# Patient Record
Sex: Male | Born: 1935 | Race: White | Hispanic: No | Marital: Married | Smoking: Former smoker
Health system: Southern US, Community
[De-identification: ages and names within clinical notes are randomized; demographics above are authoritative.]

## PROBLEM LIST (undated history)

## (undated) DIAGNOSIS — I255 Ischemic cardiomyopathy: Secondary | ICD-10-CM

## (undated) DIAGNOSIS — Z95810 Presence of automatic (implantable) cardiac defibrillator: Secondary | ICD-10-CM

## (undated) DIAGNOSIS — M1990 Unspecified osteoarthritis, unspecified site: Secondary | ICD-10-CM

## (undated) DIAGNOSIS — F0390 Unspecified dementia without behavioral disturbance: Secondary | ICD-10-CM

## (undated) DIAGNOSIS — I219 Acute myocardial infarction, unspecified: Secondary | ICD-10-CM

## (undated) DIAGNOSIS — E039 Hypothyroidism, unspecified: Secondary | ICD-10-CM

## (undated) DIAGNOSIS — H8109 Meniere's disease, unspecified ear: Secondary | ICD-10-CM

## (undated) DIAGNOSIS — G250 Essential tremor: Secondary | ICD-10-CM

## (undated) DIAGNOSIS — Z950 Presence of cardiac pacemaker: Secondary | ICD-10-CM

## (undated) DIAGNOSIS — I2510 Atherosclerotic heart disease of native coronary artery without angina pectoris: Secondary | ICD-10-CM

## (undated) DIAGNOSIS — D649 Anemia, unspecified: Secondary | ICD-10-CM

## (undated) DIAGNOSIS — J449 Chronic obstructive pulmonary disease, unspecified: Secondary | ICD-10-CM

## (undated) DIAGNOSIS — K219 Gastro-esophageal reflux disease without esophagitis: Secondary | ICD-10-CM

## (undated) DIAGNOSIS — H9190 Unspecified hearing loss, unspecified ear: Secondary | ICD-10-CM

## (undated) DIAGNOSIS — K5732 Diverticulitis of large intestine without perforation or abscess without bleeding: Secondary | ICD-10-CM

## (undated) DIAGNOSIS — I509 Heart failure, unspecified: Secondary | ICD-10-CM

## (undated) DIAGNOSIS — Z789 Other specified health status: Secondary | ICD-10-CM

## (undated) DIAGNOSIS — K635 Polyp of colon: Secondary | ICD-10-CM

## (undated) DIAGNOSIS — N189 Chronic kidney disease, unspecified: Secondary | ICD-10-CM

## (undated) DIAGNOSIS — E785 Hyperlipidemia, unspecified: Secondary | ICD-10-CM

## (undated) DIAGNOSIS — I10 Essential (primary) hypertension: Secondary | ICD-10-CM

## (undated) DIAGNOSIS — Z9289 Personal history of other medical treatment: Secondary | ICD-10-CM

## (undated) HISTORY — DX: Gastro-esophageal reflux disease without esophagitis: K21.9

## (undated) HISTORY — DX: Chronic obstructive pulmonary disease, unspecified: J44.9

## (undated) HISTORY — DX: Diverticulitis of large intestine without perforation or abscess without bleeding: K57.32

## (undated) HISTORY — DX: Polyp of colon: K63.5

## (undated) HISTORY — DX: Meniere's disease, unspecified ear: H81.09

## (undated) HISTORY — DX: Ischemic cardiomyopathy: I25.5

## (undated) HISTORY — PX: EYE SURGERY: SHX253

## (undated) HISTORY — DX: Essential (primary) hypertension: I10

## (undated) HISTORY — DX: Hyperlipidemia, unspecified: E78.5

## (undated) HISTORY — DX: Essential tremor: G25.0

## (undated) HISTORY — DX: Atherosclerotic heart disease of native coronary artery without angina pectoris: I25.10

## (undated) HISTORY — DX: Unspecified osteoarthritis, unspecified site: M19.90

## (undated) HISTORY — DX: Acute myocardial infarction, unspecified: I21.9

## (undated) HISTORY — PX: CARDIAC DEFIBRILLATOR PLACEMENT: SHX171

---

## 1995-09-01 HISTORY — PX: CORONARY ARTERY BYPASS GRAFT: SHX141

## 2002-08-31 HISTORY — PX: JOINT REPLACEMENT: SHX530

## 2003-09-01 HISTORY — PX: CARDIAC CATHETERIZATION: SHX172

## 2009-08-01 ENCOUNTER — Encounter: Payer: Self-pay | Admitting: Pulmonary Disease

## 2009-09-02 ENCOUNTER — Ambulatory Visit: Payer: Self-pay | Admitting: Pulmonary Disease

## 2009-09-02 DIAGNOSIS — K219 Gastro-esophageal reflux disease without esophagitis: Secondary | ICD-10-CM | POA: Insufficient documentation

## 2009-09-02 DIAGNOSIS — E119 Type 2 diabetes mellitus without complications: Secondary | ICD-10-CM

## 2009-09-02 DIAGNOSIS — E785 Hyperlipidemia, unspecified: Secondary | ICD-10-CM | POA: Insufficient documentation

## 2009-09-02 DIAGNOSIS — J309 Allergic rhinitis, unspecified: Secondary | ICD-10-CM | POA: Insufficient documentation

## 2009-09-02 DIAGNOSIS — I2510 Atherosclerotic heart disease of native coronary artery without angina pectoris: Secondary | ICD-10-CM | POA: Insufficient documentation

## 2009-09-02 DIAGNOSIS — I10 Essential (primary) hypertension: Secondary | ICD-10-CM

## 2009-09-02 DIAGNOSIS — R042 Hemoptysis: Secondary | ICD-10-CM | POA: Insufficient documentation

## 2009-09-02 LAB — CONVERTED CEMR LAB
BUN: 24 mg/dL — ABNORMAL HIGH (ref 6–23)
Calcium: 9.4 mg/dL (ref 8.4–10.5)
Chloride: 111 meq/L (ref 96–112)
Creatinine, Ser: 1.5 mg/dL (ref 0.4–1.5)
Potassium: 4.8 meq/L (ref 3.5–5.1)
Sodium: 141 meq/L (ref 135–145)

## 2009-09-04 ENCOUNTER — Encounter: Payer: Self-pay | Admitting: Pulmonary Disease

## 2009-09-04 ENCOUNTER — Ambulatory Visit: Payer: Self-pay | Admitting: Cardiovascular Disease

## 2010-05-08 ENCOUNTER — Encounter: Payer: Self-pay | Admitting: Pulmonary Disease

## 2010-05-08 ENCOUNTER — Ambulatory Visit: Payer: Self-pay | Admitting: Pulmonary Disease

## 2010-05-08 DIAGNOSIS — J4489 Other specified chronic obstructive pulmonary disease: Secondary | ICD-10-CM | POA: Insufficient documentation

## 2010-05-08 DIAGNOSIS — J449 Chronic obstructive pulmonary disease, unspecified: Secondary | ICD-10-CM

## 2010-05-30 ENCOUNTER — Telehealth: Payer: Self-pay | Admitting: Pulmonary Disease

## 2010-09-24 ENCOUNTER — Encounter: Payer: Self-pay | Admitting: Pulmonary Disease

## 2010-09-24 DIAGNOSIS — J984 Other disorders of lung: Secondary | ICD-10-CM | POA: Insufficient documentation

## 2010-09-29 ENCOUNTER — Ambulatory Visit: Payer: Self-pay | Admitting: Cardiology

## 2010-09-30 NOTE — Assessment & Plan Note (Signed)
Summary: acute sick visit for possible hemoptysis   Copy to:  Lanier Ensign Primary Bradford Cazier/Referring Kade Rickels:  Lanier Ensign  CC:  Pt states he has been spitting up blood since last visit but it usually happens in the morning, Pt states occasional raspy voice, headaches, and Productive cough.  History of Present Illness: the pt comes in today for an acute sick visit.  He was last seen in Jan of this year where he was having mild hemoptysis.  It was felt this was coming primarily from the upper airway, and I asked him to work on nasal hygiene.  He comes in today where he feels that he has continued to bring up mucus with a pink tint, but he shows me a specimen in a tissue that shows yellow mucus with a slight tint in the center that is brown not red or pink.  He has not had any chest pain or recent infection.  He coughs up mucus on a regular basis, and does have some doe.  He and his wife ask if there is anything we an do about it.  Preventive Screening-Counseling & Management  Alcohol-Tobacco     Smoking Status: quit     Packs/Day: 0.5     Year Started: 1947     Year Quit: 1997  Current Medications (verified): 1)  Lansoprazole 30 Mg Cpdr (Lansoprazole) .... Take 1 Tablet By Mouth Once A Day As Needed 2)  Celebrex 200 Mg Caps (Celecoxib) .... Take 1 Tablet By Mouth Once A Day As Needed 3)  Simvastatin 40 Mg Tabs (Simvastatin) .... Take 1 Tablet By Mouth Once A Day 4)  Glimepiride 4 Mg Tabs (Glimepiride) .... Take 1 Tablet By Mouth Two Times A Day 5)  Claritin 10 Mg Tabs (Loratadine) .... Take 1 Tablet By Mouth Once A Day 6)  Ascriptin 325 Mg Tabs (Aspirin Buf(Alhyd-Mghyd-Cacar)) .... One Tab Once Daily 7)  Metoprolol Succinate 100 Mg Xr24h-Tab (Metoprolol Succinate) .... Take 1 Tablet By Mouth Once A Day 8)  Flovent Diskus 100 Mcg/blist Aepb (Fluticasone Propionate (Inhal)) .... Inhale 1 Puff Two Times A Day 9)  Meclizine Hcl 25 Mg Tabs (Meclizine Hcl) .... Take 1 Tablet By Mouth Three  Times A Day 10)  Losartan Potassium 100 Mg Tabs (Losartan Potassium) .... Take 1 Tablet By Mouth Once A Day 11)  Janumet 50-500 Mg Tabs (Sitagliptin-Metformin Hcl) .... One Tablet  By Mouth Two Times A Day 12)  Fish Oil 1000 Mg Caps (Omega-3 Fatty Acids) .... Take 2 Tabs Two Times A Day 13)  Qc Arthritis Pain Relief 650 Mg Cr-Tabs (Acetaminophen) .... Take By Mouth As Needed  Allergies: 1)  ! * Altace  Past History:  Past medical, surgical, family and social histories (including risk factors) reviewed, and no changes noted (except as noted below).  Past Medical History: chronic renal insufficiency GERD (ICD-530.81) CORONARY ARTERY DISEASE (ICD-414.00) ALLERGIC RHINITIS (ICD-477.9) HYPERTENSION (ICD-401.9) MYOCARDIAL INFARCTION (ICD-410.90) HYPERLIPIDEMIA (ICD-272.4) DM (ICD-250.00)      Dr. Eliott Nine is [facility]  Past Surgical History: Reviewed history from 09/02/2009 and no changes required. CABG Feb 1997 Gallbladder Aug 2005 R hip replacement Jan 2004  Family History: Reviewed history from 09/02/2009 and no changes required. cancer: sister (cervical), mother (stomach   Social History: Reviewed history from 09/02/2009 and no changes required. Patient states former smoker.  started at age 12 approx.  smoked 1 to 2 ppd.  quit 1997. pt is married. pt has children. pt is retired.  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  Packs/Day:  0.5  Review of Systems       The patient complains of productive cough, coughing up blood, acid heartburn, indigestion, headaches, nasal congestion/difficulty breathing through nose, sneezing, anxiety, depression, and change in color of mucus.  The patient denies shortness of breath with activity, shortness of breath at rest, non-productive cough, chest pain, irregular heartbeats, loss of appetite, weight change, abdominal pain, difficulty swallowing, sore throat, tooth/dental problems, itching, ear ache, hand/feet  swelling, joint stiffness or pain, rash, and fever.    Vital Signs:  Patient profile:   77 year old male Height:      69 inches Weight:      198.25 pounds O2 Sat:      97 % on Room air Temp:     97.8 degrees F oral Pulse rate:   60 / minute BP sitting:   110 / 60  (left arm) Cuff size:   regular  Vitals Entered By: Carver Fila (May 08, 2010 2:11 PM)  O2 Flow:  Room air CC: Pt states he has been spitting up blood since last visit but it usually happens in the morning, Pt states occasional raspy voice, headaches, Productive cough Is Patient Diabetic? Yes Comments meds and allergies updated Phone number updated Carver Fila  May 08, 2010 2:16 PM    Physical Exam  General:  wd male in nad Nose:  slight prominence of blood vessels along septum on right, but no active bleeding. Mouth:  clear, no heme staining. Lungs:  decreaed bs, but clear no wheezing Heart:  rrr, no mrg Extremities:  no edema or cyanosis  Neurologic:  alert and oriented, moves all 4.   Impression & Recommendations:  Problem # 1:  HEMOPTYSIS UNSPECIFIED (ICD-786.30)  It is unclear if the pt is really having hemoptysis.  The sample he shows me today does not contain blood, and he denies any streaking or clots noted.  I would like to follow him closely for now.  If he coughs up streaks or clots, he is to call so that we can arrange an airway exam.  He is due for a f/u ct for his nodule in Jan of next year.  Problem # 2:  COPD, MILD (ICD-496) the pt has mild obstructive disease that is most likely due to emphysema.  He is having excessive mucus and some doe.Marland KitchenMarland KitchenI would like to try him on spiriva, which may help with both of these issues.  Medications Added to Medication List This Visit: 1)  Lansoprazole 30 Mg Cpdr (Lansoprazole) .... Take 1 tablet by mouth once a day as needed 2)  Celebrex 200 Mg Caps (Celecoxib) .... Take 1 tablet by mouth once a day as needed 3)  Ascriptin 325 Mg Tabs (Aspirin  buf(alhyd-mghyd-cacar)) .... One tab once daily 4)  Janumet 50-500 Mg Tabs (Sitagliptin-metformin hcl) .... One tablet  by mouth two times a day  Other Orders: Est. Patient Level IV (16109)  Patient Instructions: 1)  will monitor for any changes in mucus for now. 2)  continue nasal rinses as needed. 3)  don't forget about followup ct chest next Jan. 4)  trial of spiriva one inhalation each am for next 4 weeks.  If you do not see any difference in quantity of mucus, or breathing, can stop 5)  proair inhaler 2 puffs up to every 4-6 hrs for rescue only. 6)  stop flovent.   Immunization History:  Influenza Immunization History:    Influenza:  historical (05/31/2009)  Pneumovax Immunization History:    Pneumovax:  historical (05/31/2009)

## 2010-09-30 NOTE — Assessment & Plan Note (Signed)
Summary: consult for cough/hemoptysis   Copy to:  Lanier Ensign Primary Provider/Referring Provider:  Lanier Ensign  CC:  Pulmonary Consult.  History of Present Illness: The pt is a 77y/o male who I have been asked to see for cough.  The pt states it started about 2-3 mos ago, and denies any precipitating event such as a URI.  He began to see streaks of blood in the mucus that was bright red in character, and this typically happened in the am's.  He denied any purulence mucus, and states it is always white.  He has been treated with a zpak and levaquin, and most recently was put on flovent.  His cough has gotten a little bit better.  He did cough up streaks of blood this am.  He has never had any chest congestion, rattling, chest pain, anorexia, weight loss, or sob.  He has noted 2 episodes of scant epistaxis.  Preventive Screening-Counseling & Management  Alcohol-Tobacco     Smoking Status: quit  Current Medications (verified): 1)  Lansoprazole 30 Mg Cpdr (Lansoprazole) .... Take 1 Tablet By Mouth Once A Day 2)  Celebrex 200 Mg Caps (Celecoxib) .... Take 1 Tablet By Mouth Once A Day 3)  Simvastatin 40 Mg Tabs (Simvastatin) .... Take 1 Tablet By Mouth Once A Day 4)  Glimepiride 4 Mg Tabs (Glimepiride) .... Take 1 Tablet By Mouth Two Times A Day 5)  Claritin 10 Mg Tabs (Loratadine) .... Take 1 Tablet By Mouth Once A Day 6)  Bayer Aspirin 325 Mg Tabs (Aspirin) .... Take 1 Tablet By Mouth Once A Day 7)  Metoprolol Succinate 100 Mg Xr24h-Tab (Metoprolol Succinate) .... Take 1 Tablet By Mouth Once A Day 8)  Flovent Diskus 100 Mcg/blist Aepb (Fluticasone Propionate (Inhal)) .... Inhale 1 Puff Two Times A Day 9)  Januvia 100 Mg Tabs (Sitagliptin Phosphate) .... Take 1 Tablet By Mouth Once A Day 10)  Meclizine Hcl 25 Mg Tabs (Meclizine Hcl) .... Take 1 Tablet By Mouth Three Times A Day 11)  Losartan Potassium 100 Mg Tabs (Losartan Potassium) .... Take 1 Tablet By Mouth Once A Day 12)  Janumet  50-1000 Mg Tabs (Sitagliptin-Metformin Hcl) .... Take 1 Tablet By Mouth Two Times A Day 13)  Fish Oil 1000 Mg Caps (Omega-3 Fatty Acids) .... Take 2 Tabs Two Times A Day 14)  Qc Arthritis Pain Relief 650 Mg Cr-Tabs (Acetaminophen) .... Take By Mouth As Needed  Allergies (verified): 1)  ! * Altace  Past History:  Past Medical History: chronic renal insufficiency GERD (ICD-530.81) CORONARY ARTERY DISEASE (ICD-414.00) ALLERGIC RHINITIS (ICD-477.9) HYPERTENSION (ICD-401.9) MYOCARDIAL INFARCTION (ICD-410.90) HYPERLIPIDEMIA (ICD-272.4) DM (ICD-250.00)    Past Surgical History: CABG Feb 1997 Gallbladder Aug 2005 R hip replacement Jan 2004  Family History: Reviewed history and no changes required. cancer: sister (cervical), mother (stomach   Social History: Reviewed history and no changes required. Patient states former smoker.  started at age 62 approx.  smoked 1 to 2 ppd.  quit 1997. pt is married. pt has children. pt is retired.  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  Smoking Status:  quit  Review of Systems       The patient complains of productive cough, coughing up blood, headaches, sneezing, hand/feet swelling, and joint stiffness or pain.  The patient denies shortness of breath with activity, shortness of breath at rest, non-productive cough, chest pain, irregular heartbeats, acid heartburn, indigestion, loss of appetite, weight change, abdominal pain, difficulty swallowing, sore throat, tooth/dental problems,  nasal congestion/difficulty breathing through nose, itching, ear ache, anxiety, depression, rash, change in color of mucus, and fever.    Vital Signs:  Patient profile:   77 year old male Height:      69 inches Weight:      197.25 pounds BMI:     29.23 O2 Sat:      96 % on Room air Temp:     97.5 degrees F oral Pulse rate:   65 / minute BP sitting:   130 / 76  (left arm) Cuff size:   regular  Vitals Entered By: Arman Filter LPN (September 02, 2009 2:51 PM)  O2 Flow:  Room air CC: Pulmonary Consult Comments Medications reviewed with patient Arman Filter LPN  September 02, 2009 2:57 PM    Physical Exam  General:  thin male in nad Eyes:  PERRLA and EOMI.   Nose:  patent without discharge, a small area crusted with blood in right nostril medially Mouth:  clear  Neck:  no jvd, tmg, LN Lungs:  decreased bs throughout, no wheezing or rhonchi Heart:  rrr, no mrg Abdomen:  soft and nontender, bs+ Extremities:  no edema or cyanosis pulses intact distally Neurologic:  alert and oriented, moves all 4.   Impression & Recommendations:  Problem # 1:  HEMOPTYSIS UNSPECIFIED (ICD-786.30) the pt has persistent streaky hemoptysis with cough despite being treated with abx times two.  He has a nonlocalizing cxr by report, but has an extensive past history of tobacco abuse.  Studies have shown a 3-5% incidence of endobronchial cancer in patients with > 50pk year h/o smoking and a negative cxr.  It concerns me that he has continued to cough with hemoptysis despite abx, and that he never really had symptoms of "bronchitis".  This may all be from his nasal cavity?  After a long discussion with the pt, have decided to check ct chest and to work on nasal hygeine for the next few weeks.  If his chest ct is clear and his hemoptysis resolves with nasal rinses, no further w/u needed.  If his hemoptysis persists despite a negative chest ct, would recommend an airway exam for completeness.  Medications Added to Medication List This Visit: 1)  Lansoprazole 30 Mg Cpdr (Lansoprazole) .... Take 1 tablet by mouth once a day 2)  Celebrex 200 Mg Caps (Celecoxib) .... Take 1 tablet by mouth once a day 3)  Simvastatin 40 Mg Tabs (Simvastatin) .... Take 1 tablet by mouth once a day 4)  Glimepiride 4 Mg Tabs (Glimepiride) .... Take 1 tablet by mouth two times a day 5)  Claritin 10 Mg Tabs (Loratadine) .... Take 1 tablet by mouth once a day 6)  Bayer Aspirin  325 Mg Tabs (Aspirin) .... Take 1 tablet by mouth once a day 7)  Metoprolol Succinate 100 Mg Xr24h-tab (Metoprolol succinate) .... Take 1 tablet by mouth once a day 8)  Flovent Diskus 100 Mcg/blist Aepb (Fluticasone propionate (inhal)) .... Inhale 1 puff two times a day 9)  Januvia 100 Mg Tabs (Sitagliptin phosphate) .... Take 1 tablet by mouth once a day 10)  Meclizine Hcl 25 Mg Tabs (Meclizine hcl) .... Take 1 tablet by mouth three times a day 11)  Losartan Potassium 100 Mg Tabs (Losartan potassium) .... Take 1 tablet by mouth once a day 12)  Janumet 50-1000 Mg Tabs (Sitagliptin-metformin hcl) .... Take 1 tablet by mouth two times a day 13)  Fish Oil 1000 Mg Caps (Omega-3 fatty acids) .Marland KitchenMarland KitchenMarland Kitchen  Take 2 tabs two times a day 14)  Qc Arthritis Pain Relief 650 Mg Cr-tabs (Acetaminophen) .... Take by mouth as needed  Other Orders: Consultation Level IV (04540) Radiology Referral (Radiology) TLB-BMP (Basic Metabolic Panel-BMET) (80048-METABOL)  Patient Instructions: 1)  neil med sinus rinses am and pm for next 2 weeks 2)  will schedule for ct chest, and will call with results.

## 2010-09-30 NOTE — Progress Notes (Signed)
Summary: prescript for Spiriva   Phone Note Call from Patient   Caller: Spouse Call For: clance Summary of Call: need prescript for spiriva sent to pt Initial call taken by: Rickard Patience,  May 30, 2010 2:49 PM  Follow-up for Phone Call        called and spoke with pt's spouse.  spouse states pt was recently seen by [facility] on 05/08/2010.  Pt is following KC's instructions- stopped Flovent and started on trial of Spiriva.  Spouse states she has noticed a decrease in pt's coughing- approx 70% to 80% improved and also a decrease in sputum.  Spouse requests a 90 day RX x 3 refills on the spiriva mailed to their home address (which I verified was correct in our records).  Will forward message to [facility] to address. Arman Filter LPN  May 30, 2010 3:02 PM   Additional Follow-up for Phone Call Additional follow up Details #1::        ok with me Additional Follow-up by: Barbaraann Share MD,  May 30, 2010 4:54 PM    Additional Follow-up for Phone Call Additional follow up Details #2::    rx was accidently printed 3 times because we were having problems with printer.  Put Rx in KC's very important look at folder for him to sign.  Aundra Millet Reynolds LPN  May 30, 2010 5:11 PM   Additional Follow-up for Phone Call Additional follow up Details #3:: Details for Additional Follow-up Action Taken: done Additional Follow-up by: Barbaraann Share MD,  May 30, 2010 5:21 PM  New/Updated Medications: SPIRIVA HANDIHALER 18 MCG CAPS (TIOTROPIUM BROMIDE MONOHYDRATE) inhale contents of 1 capsule daily Prescriptions: SPIRIVA HANDIHALER 18 MCG CAPS (TIOTROPIUM BROMIDE MONOHYDRATE) inhale contents of 1 capsule daily  #90 x 3   Entered by:   Arman Filter LPN   Authorized by:   Barbaraann Share MD   Signed by:   Arman Filter LPN on 02/54/2706   Method used:   Print then Give to Patient   RxID:   [id number] SPIRIVA HANDIHALER 18 MCG CAPS (TIOTROPIUM BROMIDE MONOHYDRATE) inhale contents of  1 capsule daily  #90 x 3   Entered by:   Vernie Murders   Authorized by:   Barbaraann Share MD   Signed by:   Vernie Murders on 05/30/2010   Method used:   Print then Give to Patient   RxID:   [id number] SPIRIVA HANDIHALER 18 MCG CAPS (TIOTROPIUM BROMIDE MONOHYDRATE) inhale contents of 1 capsule daily  #90 x 3   Entered by:   Vernie Murders   Authorized by:   Barbaraann Share MD   Signed by:   Vernie Murders on 05/30/2010   Method used:   Print then Give to Patient   RxID:   [id number]   Appended Document: prescript for Spiriva  spouse aware rx mailed to home address which i verified we had correct in his chart.

## 2010-10-02 NOTE — Miscellaneous (Signed)
Summary: Orders Update  Clinical Lists Changes  Problems: Added new problem of PULMONARY NODULE (ICD-518.89) Orders: Added new Referral order of Radiology Referral (Radiology) - Signed 

## 2010-10-03 NOTE — Miscellaneous (Signed)
Summary: NNP  NNP   Imported By: Marylou Mccoy 09/10/2009 16:08:53  _____________________________________________________________________  External Attachment:    Type:   Image     Comment:   External Document

## 2011-05-12 ENCOUNTER — Encounter: Payer: Self-pay | Admitting: Internal Medicine

## 2011-05-13 ENCOUNTER — Ambulatory Visit (INDEPENDENT_AMBULATORY_CARE_PROVIDER_SITE_OTHER): Payer: Medicare Other | Admitting: Internal Medicine

## 2011-05-13 ENCOUNTER — Encounter: Payer: Self-pay | Admitting: Internal Medicine

## 2011-05-13 DIAGNOSIS — I259 Chronic ischemic heart disease, unspecified: Secondary | ICD-10-CM

## 2011-05-13 DIAGNOSIS — I5022 Chronic systolic (congestive) heart failure: Secondary | ICD-10-CM

## 2011-05-13 DIAGNOSIS — I10 Essential (primary) hypertension: Secondary | ICD-10-CM

## 2011-05-13 DIAGNOSIS — I509 Heart failure, unspecified: Secondary | ICD-10-CM

## 2011-05-13 NOTE — Patient Instructions (Signed)
Your physician recommends that you schedule a follow-up appointment with Dr Eldridge Dace as scheduled   Information given for ICD--will call me if he wants to schedule

## 2011-05-14 ENCOUNTER — Telehealth: Payer: Self-pay | Admitting: Internal Medicine

## 2011-05-14 NOTE — Telephone Encounter (Signed)
RN s/w Pt's wife: Alvino Chapel. Dr Johney Frame s/w Pt re: a defibrillator on yesterday. Spouse called back with questions: success rate of defibrillator, occurrences of infection,  how long does it take to schedule and have it done.  RN explained that Tresa Endo, Dr Jenel Lucks Nurse was out of the office today and will return on Friday/Monday who will follow up with answers to her questions. Pt's wife verbalizes understanding.

## 2011-05-14 NOTE — Telephone Encounter (Signed)
Pt wife calling w/ questions regarding procedure MD wants to perform on pt. Please return pt wife call to discuss further.

## 2011-05-15 NOTE — Telephone Encounter (Signed)
Pt's wife calling re message from yesterday, requesting call re questions on ICD , pls call [phone]

## 2011-05-18 DIAGNOSIS — I5022 Chronic systolic (congestive) heart failure: Secondary | ICD-10-CM | POA: Insufficient documentation

## 2011-05-18 NOTE — Telephone Encounter (Signed)
Patrick Boyle,  Please scheduled for ICD implantation. Also have Vivian/ research team discuss Analyze ST study with him.  Thanks

## 2011-05-18 NOTE — Telephone Encounter (Signed)
PT'S WIFE CALLING BACK, HAS NOT HEARD FROM KELLY, BUT READY TO SET UP IMPLANT, CAN'T DO 06-18-09-24, PLS CALL

## 2011-05-18 NOTE — Progress Notes (Signed)
Patrick Boyle is a pleasant 76 y.o. male patient with a h/o CAD s/p CABG, ischemic CM, HTN, and DM who presents today for EP consultation regarding risk stratefication of sudden death.  He previously presented with MI 1997 for which he underwent subsequent CABG.  He did well until 2005 when he returned with MI.  He underwent cath at that time and medical therapy was advised.  He reports doing reasonably well thereafter.  He remains active despite his age.  He reports occasional fatigue.  He reports dypsnea with moderate activity.  He denies symptoms of palpitations, chest pain, shortness of breath at rest, orthopnea, PND, lower extremity edema, dizziness, presyncope, syncope, or neurologic sequela. The patient is tolerating medications without difficulties and is otherwise without complaint today.   Past Medical History  Diagnosis Date  . Diabetes mellitus     dr Lenise Arena  . Hyperlipidemia   . Hypertension   . Coronary artery disease     s/p CABG 1997  . GERD (gastroesophageal reflux disease)   . Arthritis   . Allergic rhinitis   . Hyperplastic colon polyp   . Diverticulitis, colon   . Meniere's syndrome   . Tremor, essential     dr Anne Hahn  . COPD, mild     dr clance  . MI (myocardial infarction) 1997, 2005  . Ischemic cardiomyopathy    Past Surgical History  Procedure Date  . Coronary artery bypass graft 1997    [place]  . Cardiac catheterization 2005    medical therapy    Current Outpatient Prescriptions  Medication Sig Dispense Refill  . Acetaminophen (ARTHRITIS PAIN RELIEF PO) Take 650 mg by mouth as needed.        Marland Kitchen aspirin 325 MG tablet Take 325 mg by mouth daily.        . bimatoprost (LUMIGAN) 0.03 % ophthalmic solution Apply 1 drop to eye every evening.        . cetirizine (ZYRTEC) 10 MG tablet Take 5 mg by mouth daily.        . fish oil-omega-3 fatty acids 1000 MG capsule Take 2 g by mouth 2 (two) times daily.        . Fluticasone Propionate, Inhal, (FLOVENT DISKUS) 100  MCG/BLIST AEPB Inhale 1 puff into the lungs 2 (two) times daily.        Marland Kitchen glimepiride (AMARYL) 4 MG tablet Take 4 mg by mouth 2 (two) times daily.        . Hypertonic Nasal Wash (SINUS RINSE) PACK Place into the nose as needed.        . lansoprazole (PREVACID) 30 MG capsule Take 30 mg by mouth as needed.        Marland Kitchen losartan (COZAAR) 100 MG tablet Take 100 mg by mouth daily.        . meclizine (ANTIVERT) 25 MG tablet Take 25 mg by mouth as needed.        . metoprolol (TOPROL-XL) 100 MG 24 hr tablet Take 100 mg by mouth daily.        . simvastatin (ZOCOR) 40 MG tablet Take 40 mg by mouth at bedtime.        . sitaGLIPtan-metformin (JANUMET) 50-500 MG per tablet Take 1 tablet by mouth 2 (two) times daily with a meal.          Allergies  Allergen Reactions  . Metformin And Related     GI sensitivity with high doses  . Ramipril     REACTION: cough  History   Social History  . Marital Status: Married    Spouse Name: N/A    Number of Children: N/A  . Years of Education: N/A   Occupational History  . retired    Social History Main Topics  . Smoking status: Former Smoker -- 35 years    Types: Cigarettes    Quit date: 09/01/1995  . Smokeless tobacco: Not on file  . Alcohol Use: 0.0 oz/week     occasional scotch  . Drug Use: No  . Sexually Active: Not on file   Other Topics Concern  . 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 Parkinsonism Father     ROS- All systems are reviewed and negative except as per the HPI above  Physical Exam: Filed Vitals:   05/13/11 1045  BP: 104/66  Pulse: 72  Height: 5\' 11"  (1.803 m)  Weight: 195 lb (88.451 kg)    GEN- The patient is well appearing, alert and oriented x 3 today.   Head- normocephalic, atraumatic Eyes-  Sclera clear, conjunctiva pink Ears- hearing intact Oropharynx- clear Neck- supple, no  JVP Lymph- no cervical lymphadenopathy Lungs- Clear to ausculation bilaterally, normal work of breathing Heart- Regular rate and rhythm, no murmurs, rubs or gallops, PMI not laterally displaced GI- soft, NT, ND, + BS Extremities- no clubbing, cyanosis, or edema MS- no significant deformity or atrophy Skin- no rash or lesion Psych- euthymic mood, full affect Neuro- strength and sensation are intact  EKG from 05/11/11 reveals sinus rhythm 64 bpm, PR 192, QRS 120, RsR', otherwise normal ekg  Echo from 05/14/10- EF 25-30%, LVEDD 54, inferoposterior AK, apical HK, mild MR, 47  Assessment and Plan:

## 2011-05-18 NOTE — Assessment & Plan Note (Signed)
The patient has an ischemic CM (EF 25-30%), NYHA Class II/III CHF, and CAD.  He is on an optimal medical regimen.  At this time, he meets MADIT II/ SCD-HeFT criteria for ICD implantation for primary prevention of sudden death.  His QRS is .  He is therefore not a candidate for resynchronization therapy.  Risks, benefits, alternatives to ICD implantation were discussed in detail with the patient today. The patient  understands that the risks include but are not limited to bleeding, infection, pneumothorax, perforation, tamponade, vascular damage, renal failure, MI, stroke, death, inappropriate shocks, and lead dislodgement and wishes to further contemplate this option.  He is presently uncertain that ICD implantation is his desire.  Given his advanced age, I think that ongoing medical therapy is a reasonable strategy. I have provided him with a handout about ICD therapy today.  He will follow-up with Dr Eldridge Dace.  He will contact my office if he wishes to proceed with ICD implantation in the future.

## 2011-05-18 NOTE — Telephone Encounter (Signed)
See note

## 2011-05-18 NOTE — Telephone Encounter (Signed)
Can you call patient's wife and speak with her

## 2011-05-18 NOTE — Assessment & Plan Note (Signed)
Stable No change required today  

## 2011-05-18 NOTE — Assessment & Plan Note (Signed)
Continue medical therapy No symptoms of ischemia at this time.

## 2011-05-20 NOTE — Telephone Encounter (Signed)
Pt's wife calling back to schedule pt's icd implant,  [phone]

## 2011-05-20 NOTE — Telephone Encounter (Signed)
Spoke with patients wife and let her know I can schedule for 06/11/11 and labs on 06/04/11 at 10:00am

## 2011-05-21 ENCOUNTER — Encounter: Payer: Self-pay | Admitting: *Deleted

## 2011-05-21 ENCOUNTER — Other Ambulatory Visit: Payer: Self-pay | Admitting: *Deleted

## 2011-05-21 DIAGNOSIS — I509 Heart failure, unspecified: Secondary | ICD-10-CM

## 2011-05-21 NOTE — Progress Notes (Signed)
Pre-op Labs

## 2011-05-23 NOTE — Telephone Encounter (Signed)
Dr Johney Frame spoke with patients wife and lmom for me to have Maureen Ralphs from research call patient about analyze ST  I will forward to her

## 2011-05-29 ENCOUNTER — Encounter: Payer: Self-pay | Admitting: Internal Medicine

## 2011-06-04 ENCOUNTER — Other Ambulatory Visit (INDEPENDENT_AMBULATORY_CARE_PROVIDER_SITE_OTHER): Payer: Medicare Other | Admitting: *Deleted

## 2011-06-04 ENCOUNTER — Encounter: Payer: Self-pay | Admitting: *Deleted

## 2011-06-04 DIAGNOSIS — I509 Heart failure, unspecified: Secondary | ICD-10-CM

## 2011-06-04 LAB — CBC WITH DIFFERENTIAL/PLATELET
Basophils Relative: 0.4 % (ref 0.0–3.0)
Eosinophils Relative: 3.8 % (ref 0.0–5.0)
HCT: 39.3 % (ref 39.0–52.0)
Hemoglobin: 13 g/dL (ref 13.0–17.0)
Lymphs Abs: 1.8 10*3/uL (ref 0.7–4.0)
MCV: 97.9 fl (ref 78.0–100.0)
Monocytes Relative: 7.7 % (ref 3.0–12.0)
Neutro Abs: 5.5 10*3/uL (ref 1.4–7.7)
Platelets: 167 10*3/uL (ref 150.0–400.0)
RBC: 4.02 Mil/uL — ABNORMAL LOW (ref 4.22–5.81)
WBC: 8.3 10*3/uL (ref 4.5–10.5)

## 2011-06-04 LAB — BASIC METABOLIC PANEL
Chloride: 112 mEq/L (ref 96–112)
GFR: 36.67 mL/min — ABNORMAL LOW (ref >60.00)
Potassium: 5.1 mEq/L (ref 3.5–5.1)
Sodium: 139 mEq/L (ref 135–145)

## 2011-06-05 ENCOUNTER — Encounter: Payer: Self-pay | Admitting: Internal Medicine

## 2011-06-11 ENCOUNTER — Ambulatory Visit (HOSPITAL_COMMUNITY): Payer: Medicare Other

## 2011-06-11 ENCOUNTER — Ambulatory Visit (HOSPITAL_COMMUNITY)
Admission: RE | Admit: 2011-06-11 | Discharge: 2011-06-12 | Disposition: A | Discharge status: Home / Self Care | Payer: Medicare Other | Source: Ambulatory Visit | Attending: Internal Medicine | Admitting: Internal Medicine

## 2011-06-11 DIAGNOSIS — E119 Type 2 diabetes mellitus without complications: Secondary | ICD-10-CM | POA: Insufficient documentation

## 2011-06-11 DIAGNOSIS — E785 Hyperlipidemia, unspecified: Secondary | ICD-10-CM | POA: Insufficient documentation

## 2011-06-11 DIAGNOSIS — J4489 Other specified chronic obstructive pulmonary disease: Secondary | ICD-10-CM | POA: Insufficient documentation

## 2011-06-11 DIAGNOSIS — I2589 Other forms of chronic ischemic heart disease: Secondary | ICD-10-CM | POA: Insufficient documentation

## 2011-06-11 DIAGNOSIS — I2510 Atherosclerotic heart disease of native coronary artery without angina pectoris: Secondary | ICD-10-CM | POA: Insufficient documentation

## 2011-06-11 DIAGNOSIS — I509 Heart failure, unspecified: Secondary | ICD-10-CM | POA: Insufficient documentation

## 2011-06-11 DIAGNOSIS — J449 Chronic obstructive pulmonary disease, unspecified: Secondary | ICD-10-CM | POA: Insufficient documentation

## 2011-06-11 DIAGNOSIS — I5022 Chronic systolic (congestive) heart failure: Secondary | ICD-10-CM | POA: Insufficient documentation

## 2011-06-11 DIAGNOSIS — I252 Old myocardial infarction: Secondary | ICD-10-CM | POA: Insufficient documentation

## 2011-06-11 LAB — GLUCOSE, CAPILLARY
Glucose-Capillary: 187 mg/dL — ABNORMAL HIGH (ref 70–99)
Glucose-Capillary: 59 mg/dL — ABNORMAL LOW (ref 70–99)
Glucose-Capillary: 93 mg/dL (ref 70–99)

## 2011-06-11 LAB — BASIC METABOLIC PANEL
BUN: 30 mg/dL — ABNORMAL HIGH (ref 6–23)
Creatinine, Ser: 1.65 mg/dL — ABNORMAL HIGH (ref 0.50–1.35)
GFR calc Af Amer: 45 mL/min — ABNORMAL LOW (ref >90)
GFR calc non Af Amer: 39 mL/min — ABNORMAL LOW (ref >90)

## 2011-06-12 ENCOUNTER — Ambulatory Visit (HOSPITAL_COMMUNITY): Payer: Medicare Other

## 2011-06-12 LAB — GLUCOSE, CAPILLARY: Glucose-Capillary: 173 mg/dL — ABNORMAL HIGH (ref 70–99)

## 2011-06-16 ENCOUNTER — Telehealth: Payer: Self-pay | Admitting: Internal Medicine

## 2011-06-16 NOTE — Telephone Encounter (Signed)
Pt is having burning above the site where the defib was implanted.  This started yesterday/off and on.  Tylenol does help but he is still aware of same.  Please call her back

## 2011-06-16 NOTE — Telephone Encounter (Signed)
Patient c/o burning pain around site relieved with pain medication.  Denies any redness or edema.  Area is not warm to touch.  Patient will call back in the am if anything changes and we will add him to our schedule, otherwise we will see him on the 29th as scheduled.

## 2011-06-19 ENCOUNTER — Telehealth: Payer: Self-pay | Admitting: Internal Medicine

## 2011-06-19 NOTE — Telephone Encounter (Signed)
The [facility] called to have patient pain medication refill. Patient had a ICD placement on 06/11/11. On discharge patient was given a prescription for Tylenol #3 15 tablets and no refill. I let pharmacy know MD does not give more pain medication for pain pt. Needs to take tylenol for pain. Pharmacist will call patient to let him know.

## 2011-06-19 NOTE — Telephone Encounter (Signed)
Calling regarding pt getting more pain medication

## 2011-06-25 ENCOUNTER — Ambulatory Visit: Payer: Medicare Other | Admitting: *Deleted

## 2011-06-29 ENCOUNTER — Encounter: Payer: Self-pay | Admitting: Internal Medicine

## 2011-06-29 ENCOUNTER — Ambulatory Visit (INDEPENDENT_AMBULATORY_CARE_PROVIDER_SITE_OTHER): Payer: Medicare Other | Admitting: *Deleted

## 2011-06-29 DIAGNOSIS — I259 Chronic ischemic heart disease, unspecified: Secondary | ICD-10-CM

## 2011-06-29 LAB — ICD DEVICE OBSERVATION
CHARGE TIME: 9.3 s
FVT: 0
RV LEAD AMPLITUDE: 12 mv
RV LEAD IMPEDENCE ICD: 537.5 Ohm
RV LEAD THRESHOLD: 0.5 V
TOT-0008: 0
TOT-0009: 1
TOT-0010: 1
TZON-0004SLOWVT: 30
TZON-0005SLOWVT: 6
TZON-0010SLOWVT: 40 ms

## 2011-07-02 NOTE — Discharge Summary (Signed)
Patrick Boyle, Patrick Boyle NO.:  [id number]  MEDICAL RECORD NO.:  [id number]  LOCATION:  2040                         FACILITY:  [facility]  PHYSICIAN:  Patrick Range, MD       DATE OF BIRTH:  11/27/1933  DATE OF ADMISSION:  06/11/2011 DATE OF DISCHARGE:  06/12/2011                              DISCHARGE SUMMARY   PRIMARY CARDIOLOGIST:  Patrick Crafts, MD  ELECTROPHYSIOLOGIST:  Patrick Range, MD  PRIMARY DIAGNOSIS:  Ischemic cardiomyopathy with an ejection fraction of 25%-30% despite optimal medical therapy, status post ICD implantation this admission.  SECONDARY DIAGNOSES: 1. Diabetes. 2. Hyperlipidemia. 3. Hypertension. 4. Coronary disease status post coronary artery bypass graft in 1997. 5. Gastroesophageal reflux disease. 6. Arthritis. 7. Allergic rhinitis. 8. Diverticulitis. 9. Meniere's syndrome. 10.Essential tremor. 11.Mild chronic obstructive pulmonary disease followed by Dr. Graciela Boyle. ALLERGIES/INTOLERANCES:  The patient is intolerant to GLUCOPHAGE which causes GI upset and ALTACE causes cough.  PROCEDURES THIS ADMISSION:  Implantation of a single-chamber ICD by Dr. Johney Boyle on June 11, 2011.  The patient received a St. Jude Medical model [phone] ICD with model [phone] right ventricular lead.  DFTs at the time of implant were less than or equal to 15 joules.  The patient had no early apparent complications.  Chest x-ray on June 12, 2011, demonstrated no pneumothorax, status post ICD implant.  BRIEF HPI:  [name] is a 77 year old male who was referred to Dr. Johney Boyle in the outpatient setting for risk stratification of sudden death.  He previously presented with an MI in 1997 for which he underwent CABG.  He did well until 2005 when he returned with an MI and underwent catheterization at that time.  Medical therapy was advised. He was found to have a depressed ejection fraction despite optimal medical therapy.  Risks, benefits, and alternatives  to ICD implantation were reviewed with the patient.  He wished to proceed.  HOSPITAL COURSE:  The patient was admitted on June 11, 2011, with planned implantation of an ICD.  This was carried out by Dr. Johney Boyle with details outlined above.  He was monitored on telemetry overnight, which demonstrated sinus rhythm with occasional PVCs.  His left chest was without hematoma or ecchymosis.  Chest x-ray was obtained which demonstrated no pneumothorax status post device implantation.  His device was interrogated and found to be functioning normally.  Dr. Johney Boyle examined the patient on June 12, 2011, and considered him stable for discharge.  FOLLOWUP APPOINTMENTS: 1. [facility] on June 25, 2011, at 3:00 p.m.. 2. Dr. Johney Boyle in 3 months - the office will call to schedule that     appointment. 3. Patrick Boyle as scheduled.  DISCHARGE INSTRUCTIONS: 1. Increase activity slowly. 2. No driving for 1 week. 3. Follow a low-sodium heart healthy diet. 4. See supplemental device discharge instructions for wound care and     arm mobility. 5. Keep incision clean and dry for 1 week.  DISCHARGE MEDICATIONS: 1. Saline nasal rinse packet, 1 packet nasally daily as needed. 2. Janumet 50/500 mg 1 tablet twice daily. 3. Zocor 40 mg daily at bedtime. 4. Toprol-XL 50 mg daily. 5. Meclizine 25 mg daily as  needed. 6. Losartan 100 mg daily. 7. Prevacid 30 mg daily as needed. 8. Glimepiride 4 mg 1 tablet twice daily. 9. Flovent 100 mcg 2 puffs inhaled twice daily. 10.Omega-3 two capsules twice daily. 11.Cetirizine 5 mg 1 tablet daily. 12.Bimatoprost ophthalmic solution 1 drop in both eyes daily at     bedtime. 13.Aspirin 325 mg daily. 14.Tylenol No. 3 take 1 tab every 4-6 hours as needed for pain.  The     patient is given a prescription for #15 with no refills for     discomfort at his ICD site.  DISPOSITION:  The patient was seen and examined by Dr. Johney Boyle on June 12, 2011, and considered stable for discharge.  DURATION OF DISCHARGE ENCOUNTER:  35 minutes.    Patrick Balsam, RN,BSN   ______________________________ Patrick Range, MD   AS/MEDQ  D:  06/12/2011  T:  06/12/2011  Job:  [id number]  cc:   Patrick Crafts, MD  Electronically Signed by Patrick Boyle RNBSN on 06/15/2011 10:17:05 AM Electronically Signed by Patrick Range MD on 07/02/2011 02:31:10 PM

## 2011-07-02 NOTE — Op Note (Signed)
NAMESIDDHANT, HASHEMI NO.:  [id number]  MEDICAL RECORD NO.:  [id number]  LOCATION:  2040                         FACILITY:  [facility]  PHYSICIAN:  Hillis Range, MD       DATE OF BIRTH:  April 04, 1934  DATE OF PROCEDURE:  06/11/2011 DATE OF DISCHARGE:                              OPERATIVE REPORT   SURGEON:  Hillis Range, MD  PREPROCEDURE DIAGNOSES: 1. Ischemic cardiomyopathy (ejection fraction 25%), [place] Heart     Association class 3 congestive heart failure. 2. Coronary disease. 3. Chronic systolic dysfunction.  POSTPROCEDURE DIAGNOSES: 1. Ischemic cardiomyopathy (ejection fraction 25%), [place] Heart     Association class 3 congestive heart failure. 2. Coronary disease. 3. Chronic systolic dysfunction.  PROCEDURES: 1. Left upper extremity venography. 2. Defibrillator implantation with defibrillation threshold testing.  INTRODUCTION:  Mr. Patrick Boyle is a pleasant 77 year old gentleman with a known ischemic cardiomyopathy (ejection fraction 25%), [place] Heart Association class 3 congestive heart failure, and coronary disease who presents today for ICD implantation.  He has been treated with an optimal medical regimen but continues to have a depressed ejection fraction.  He meets MADIT II and SCD-HeFT criteria for ICD implantation for primary prevention of sudden cardiac death.  He therefore presents today for ICD implantation.  DESCRIPTION OF PROCEDURE:  Informed written consent was obtained, and the patient was brought to the electrophysiology lab in the fasting state.  He was adequately sedated with intravenous Versed and fentanyl as outlined in the nursing report.  The patient's left chest was prepped and draped in the usual sterile fashion by the EP lab staff.  The skin overlying the left deltopectoral region was infiltrated with lidocaine for local analgesia.  A 4-cm incision was made over the left deltopectoral region.  A left subcutaneous ICD  pocket was fashioned using a combination of sharp and blunt dissection.  Electrocautery was required to assure hemostasis.  The cephalic vein was visualized but was noted to be very deep within the deltopectoral groove.  I therefore elected to perform lead placement via the axillary vein.  A venogram of the left upper extremity was performed which revealed a small left cephalic vein with a moderate-sized left axillary vein, which both emptied into a moderate-sized left subclavian vein.  The left axillary vein was therefore cannulated with fluoroscopic visualization.  Through the left axillary vein, a St. Jude Medical Durata model [phone] (serial number [id number]) right ventricular defibrillator lead was advanced into the right ventricular apex position.  Right ventricular lead R-waves measured 12 mV with impedance of 640 ohms and a threshold of 0.7 V at 0.4 msec.  The lead was secured to the pectoralis fascia using #2 silk suture over the suture sleeve.  The lead was then connected to a [address]. Jude Medical Fortify ST VR model [id number] (serial number [id number]) ICD. The pocket was irrigated with copious gentamicin solution.  The defibrillator was then placed into the pocket.  The pocket was then closed in 2 layers with 2-0 Vicryl suture for the subcutaneous and subcuticular layers.  Steri-Strips and a sterile dressing were then applied.  Ventricular defibrillation threshold testing was then performed.  Ventricular fibrillation was  induced with a T-wave shock. Adequate sensing of ventricular fibrillation with no dropout was observed with a programmed sensitivity of 1 mV.  Ventricular fibrillation was successfully defibrillated with a single 15-joule shock delivered.  The patient was successfully defibrillated to sinus rhythm. He remained in sinus rhythm thereafter.  There were no early apparent complications.  CONCLUSIONS: 1. Successful implantation of a [employer]. Jude Medical Fortify ST VR ICD  for     primary prevention of sudden cardiac death. 2. Defibrillation threshold less than or equal to 15 joules. 3. No early apparent complications.     Hillis Range, MD     JA/MEDQ  D:  06/11/2011  T:  06/11/2011  Job:  [id number]  cc:   Corky Crafts, MD  Electronically Signed by Hillis Range MD on 07/02/2011 02:31:14 PM

## 2011-08-14 ENCOUNTER — Encounter: Payer: Self-pay | Admitting: Internal Medicine

## 2011-09-02 DIAGNOSIS — R262 Difficulty in walking, not elsewhere classified: Secondary | ICD-10-CM | POA: Diagnosis not present

## 2011-09-10 DIAGNOSIS — R262 Difficulty in walking, not elsewhere classified: Secondary | ICD-10-CM | POA: Diagnosis not present

## 2011-09-15 DIAGNOSIS — R262 Difficulty in walking, not elsewhere classified: Secondary | ICD-10-CM | POA: Diagnosis not present

## 2011-09-17 DIAGNOSIS — R262 Difficulty in walking, not elsewhere classified: Secondary | ICD-10-CM | POA: Diagnosis not present

## 2011-09-22 DIAGNOSIS — R262 Difficulty in walking, not elsewhere classified: Secondary | ICD-10-CM | POA: Diagnosis not present

## 2011-09-22 DIAGNOSIS — Z79899 Other long term (current) drug therapy: Secondary | ICD-10-CM | POA: Diagnosis not present

## 2011-09-24 DIAGNOSIS — R262 Difficulty in walking, not elsewhere classified: Secondary | ICD-10-CM | POA: Diagnosis not present

## 2011-10-01 DIAGNOSIS — R262 Difficulty in walking, not elsewhere classified: Secondary | ICD-10-CM | POA: Diagnosis not present

## 2011-10-06 DIAGNOSIS — R262 Difficulty in walking, not elsewhere classified: Secondary | ICD-10-CM | POA: Diagnosis not present

## 2011-10-08 DIAGNOSIS — R262 Difficulty in walking, not elsewhere classified: Secondary | ICD-10-CM | POA: Diagnosis not present

## 2011-10-23 ENCOUNTER — Encounter: Payer: Medicare Other | Admitting: Internal Medicine

## 2011-10-30 ENCOUNTER — Encounter: Payer: Self-pay | Admitting: Internal Medicine

## 2011-10-30 ENCOUNTER — Ambulatory Visit (INDEPENDENT_AMBULATORY_CARE_PROVIDER_SITE_OTHER): Payer: Medicare Other | Admitting: Internal Medicine

## 2011-10-30 VITALS — BP 115/78 | HR 74 | Ht 71.0 in | Wt 192.0 lb

## 2011-10-30 DIAGNOSIS — I509 Heart failure, unspecified: Secondary | ICD-10-CM

## 2011-10-30 DIAGNOSIS — I5022 Chronic systolic (congestive) heart failure: Secondary | ICD-10-CM

## 2011-10-30 DIAGNOSIS — I259 Chronic ischemic heart disease, unspecified: Secondary | ICD-10-CM

## 2011-10-30 DIAGNOSIS — I2589 Other forms of chronic ischemic heart disease: Secondary | ICD-10-CM

## 2011-10-30 LAB — ICD DEVICE OBSERVATION
BRDY-0002RV: 40 {beats}/min
FVT: 0
HV IMPEDENCE: 68 Ohm
PACEART VT: 0
RV LEAD THRESHOLD: 0.75 V
VENTRICULAR PACING ICD: 0.06 pct
VF: 0

## 2011-10-30 NOTE — Assessment & Plan Note (Signed)
Normal ICD function See Pace Art report No changes today  

## 2011-10-30 NOTE — Patient Instructions (Signed)
Your physician wants you to follow-up in: 6 months with Dr. Johney Frame. You will receive a reminder letter in the mail two months in advance. If you don't receive a letter, please call our office to schedule the follow-up appointment.  Remote monitoring is used to monitor your Pacemaker of ICD from home. This monitoring reduces the number of office visits required to check your device to one time per year. It allows [place] to keep an eye on the functioning of your device to ensure it is working properly. You are scheduled for a device check from home on 01/28/12. You may send your transmission at any time that day. If you have a wireless device, the transmission will be sent automatically. After your physician reviews your transmission, you will receive a postcard with your next transmission date.

## 2011-10-30 NOTE — Progress Notes (Signed)
PCP:  Cynda Familia, MD, MD  The patient presents today for routine electrophysiology followup.  Since last being seen in our clinic, the patient reports doing very well.   He has meniere's disease and vertigo.  This was "very bad" in November.  He had one episode of syncope upon standing quickly.  He denies any other episodes of syncope but has frequent dizziness.  He denies ICD shocks. Today, he denies symptoms of palpitations, chest pain, shortness of breath, orthopnea, PND, or edema.  The patient feels that he is tolerating medications without difficulties and is otherwise without complaint today.   Past Medical History  Diagnosis Date  . Diabetes mellitus     dr Lenise Arena  . Hyperlipidemia   . Hypertension   . Coronary artery disease     s/p CABG 1997  . GERD (gastroesophageal reflux disease)   . Arthritis   . Allergic rhinitis   . Hyperplastic colon polyp   . Diverticulitis, colon   . Meniere's syndrome   . Tremor, essential     dr Anne Hahn  . COPD, mild     dr clance  . MI (myocardial infarction) 1997, 2005  . Ischemic cardiomyopathy     sp ICD (SJM) implanted by Dr Johney Frame   Past Surgical History  Procedure Date  . Coronary artery bypass graft 1997    [place]  . Cardiac catheterization 2005    medical therapy  . Cardiac defibrillator placement     ICD implanted by Dr Johney Frame, Analyze ST study patient    Current Outpatient Prescriptions  Medication Sig Dispense Refill  . Acetaminophen (ARTHRITIS PAIN RELIEF PO) Take 650 mg by mouth as needed.        Marland Kitchen aspirin 325 MG tablet Take 325 mg by mouth daily.        . bimatoprost (LUMIGAN) 0.03 % ophthalmic solution Apply 1 drop to eye every evening.        . cetirizine (ZYRTEC) 10 MG tablet Take 5 mg by mouth daily.        . Ergocalciferol (VITAMIN D2 PO) Take 1 tablet by mouth 2 (two) times daily.      . fish oil-omega-3 fatty acids 1000 MG capsule Take 2 g by mouth daily.       . Fluticasone Propionate, Inhal, (FLOVENT DISKUS) 100  MCG/BLIST AEPB Inhale 1 puff into the lungs 2 (two) times daily.        Marland Kitchen glimepiride (AMARYL) 4 MG tablet Take 4 mg by mouth 2 (two) times daily.        . Hypertonic Nasal Wash (SINUS RINSE) PACK Place into the nose as needed.        Marland Kitchen L-Methylfolate-B6-B12 (METANX PO) Take 1 tablet by mouth 2 (two) times daily.      . lansoprazole (PREVACID) 30 MG capsule Take 30 mg by mouth as needed.        Marland Kitchen losartan (COZAAR) 100 MG tablet Take 100 mg by mouth daily.        . meclizine (ANTIVERT) 25 MG tablet Take 25 mg by mouth as needed.        . metoprolol (TOPROL-XL) 100 MG 24 hr tablet Take 100 mg by mouth daily.        . simvastatin (ZOCOR) 40 MG tablet Take 40 mg by mouth at bedtime.        . sitaGLIPtan-metformin (JANUMET) 50-500 MG per tablet Take 1 tablet by mouth 2 (two) times daily with a meal.        .  tiotropium (SPIRIVA HANDIHALER) 18 MCG inhalation capsule Place 18 mcg into inhaler and inhale as needed.          Allergies  Allergen Reactions  . Metformin And Related     GI sensitivity with high doses  . Ramipril     REACTION: cough    History   Social History  . Marital Status: Married    Spouse Name: N/A    Number of Children: N/A  . Years of Education: N/A   Occupational History  . retired    Social History Main Topics  . Smoking status: Former Smoker -- 35 years    Types: Cigarettes    Quit date: 09/01/1995  . Smokeless tobacco: Not on file  . Alcohol Use: 0.0 oz/week     occasional scotch  . Drug Use: No  . Sexually Active: Not on file   Other Topics Concern  . 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 Parkinsonism Father      Physical Exam: Filed Vitals:   10/30/11 1402  BP: 115/78  Pulse: 74  Height: 5\' 11"  (1.803 m)  Weight: 192 lb (87.091 kg)    GEN- The patient is well appearing, alert and oriented x 3 today.   Head-  normocephalic, atraumatic Eyes-  Sclera clear, conjunctiva pink Ears- hearing intact Oropharynx- clear Neck- supple, no JVP Lymph- no cervical lymphadenopathy Lungs- Clear to ausculation bilaterally, normal work of breathing Chest- ICD pocket is well healed Heart- Regular rate and rhythm, no murmurs, rubs or gallops, PMI not laterally displaced GI- soft, NT, ND, + BS Extremities- no clubbing, cyanosis, or edema  ICD interrogation- reviewed in detail today,  See PACEART report  Assessment and Plan:

## 2011-11-05 DIAGNOSIS — H409 Unspecified glaucoma: Secondary | ICD-10-CM | POA: Diagnosis not present

## 2011-11-05 DIAGNOSIS — H4011X0 Primary open-angle glaucoma, stage unspecified: Secondary | ICD-10-CM | POA: Diagnosis not present

## 2011-11-10 DIAGNOSIS — E782 Mixed hyperlipidemia: Secondary | ICD-10-CM | POA: Diagnosis not present

## 2011-11-10 DIAGNOSIS — I2510 Atherosclerotic heart disease of native coronary artery without angina pectoris: Secondary | ICD-10-CM | POA: Diagnosis not present

## 2011-11-10 DIAGNOSIS — I10 Essential (primary) hypertension: Secondary | ICD-10-CM | POA: Diagnosis not present

## 2011-11-10 DIAGNOSIS — I428 Other cardiomyopathies: Secondary | ICD-10-CM | POA: Diagnosis not present

## 2011-11-12 DIAGNOSIS — R197 Diarrhea, unspecified: Secondary | ICD-10-CM | POA: Diagnosis not present

## 2011-11-12 DIAGNOSIS — R110 Nausea: Secondary | ICD-10-CM | POA: Diagnosis not present

## 2011-11-12 DIAGNOSIS — J3489 Other specified disorders of nose and nasal sinuses: Secondary | ICD-10-CM | POA: Diagnosis not present

## 2011-11-12 DIAGNOSIS — R5383 Other fatigue: Secondary | ICD-10-CM | POA: Diagnosis not present

## 2011-11-13 ENCOUNTER — Emergency Department (HOSPITAL_BASED_OUTPATIENT_CLINIC_OR_DEPARTMENT_OTHER)
Admission: EM | Admit: 2011-11-13 | Discharge: 2011-11-13 | Disposition: A | Discharge status: Home / Self Care | Payer: Medicare Other | Attending: Emergency Medicine | Admitting: Emergency Medicine

## 2011-11-13 ENCOUNTER — Encounter (HOSPITAL_BASED_OUTPATIENT_CLINIC_OR_DEPARTMENT_OTHER): Payer: Self-pay | Admitting: *Deleted

## 2011-11-13 DIAGNOSIS — E860 Dehydration: Secondary | ICD-10-CM | POA: Insufficient documentation

## 2011-11-13 DIAGNOSIS — E785 Hyperlipidemia, unspecified: Secondary | ICD-10-CM | POA: Insufficient documentation

## 2011-11-13 DIAGNOSIS — R112 Nausea with vomiting, unspecified: Secondary | ICD-10-CM | POA: Diagnosis not present

## 2011-11-13 DIAGNOSIS — E119 Type 2 diabetes mellitus without complications: Secondary | ICD-10-CM | POA: Insufficient documentation

## 2011-11-13 DIAGNOSIS — Z951 Presence of aortocoronary bypass graft: Secondary | ICD-10-CM | POA: Diagnosis not present

## 2011-11-13 DIAGNOSIS — M129 Arthropathy, unspecified: Secondary | ICD-10-CM | POA: Insufficient documentation

## 2011-11-13 DIAGNOSIS — I252 Old myocardial infarction: Secondary | ICD-10-CM | POA: Insufficient documentation

## 2011-11-13 DIAGNOSIS — Z79899 Other long term (current) drug therapy: Secondary | ICD-10-CM | POA: Insufficient documentation

## 2011-11-13 DIAGNOSIS — I10 Essential (primary) hypertension: Secondary | ICD-10-CM | POA: Insufficient documentation

## 2011-11-13 DIAGNOSIS — J449 Chronic obstructive pulmonary disease, unspecified: Secondary | ICD-10-CM | POA: Diagnosis not present

## 2011-11-13 DIAGNOSIS — R197 Diarrhea, unspecified: Secondary | ICD-10-CM | POA: Diagnosis not present

## 2011-11-13 DIAGNOSIS — J4489 Other specified chronic obstructive pulmonary disease: Secondary | ICD-10-CM | POA: Insufficient documentation

## 2011-11-13 LAB — COMPREHENSIVE METABOLIC PANEL
ALT: 28 U/L (ref 0–53)
Alkaline Phosphatase: 72 U/L (ref 39–117)
BUN: 29 mg/dL — ABNORMAL HIGH (ref 6–23)
CO2: 17 mEq/L — ABNORMAL LOW (ref 19–32)
GFR calc Af Amer: 60 mL/min — ABNORMAL LOW (ref >90)
GFR calc non Af Amer: 52 mL/min — ABNORMAL LOW (ref >90)
Glucose, Bld: 207 mg/dL — ABNORMAL HIGH (ref 70–99)
Potassium: 5.2 mEq/L — ABNORMAL HIGH (ref 3.5–5.1)
Sodium: 136 mEq/L (ref 135–145)
Total Bilirubin: 0.4 mg/dL (ref 0.3–1.2)
Total Protein: 6.8 g/dL (ref 6.0–8.3)

## 2011-11-13 LAB — DIFFERENTIAL
Eosinophils Absolute: 0.1 10*3/uL (ref 0.0–0.7)
Eosinophils Relative: 1 % (ref 0–5)
Lymphocytes Relative: 17 % (ref 12–46)
Lymphs Abs: 1.9 10*3/uL (ref 0.7–4.0)
Monocytes Relative: 9 % (ref 3–12)
Neutrophils Relative %: 73 % (ref 43–77)

## 2011-11-13 LAB — CBC
Hemoglobin: 11.4 g/dL — ABNORMAL LOW (ref 13.0–17.0)
MCH: 31.6 pg (ref 26.0–34.0)
MCV: 91.1 fL (ref 78.0–100.0)
RBC: 3.61 MIL/uL — ABNORMAL LOW (ref 4.22–5.81)
WBC: 11 10*3/uL — ABNORMAL HIGH (ref 4.0–10.5)

## 2011-11-13 LAB — URINALYSIS, ROUTINE W REFLEX MICROSCOPIC
Bilirubin Urine: NEGATIVE
Ketones, ur: 15 mg/dL — AB
Leukocytes, UA: NEGATIVE
Nitrite: NEGATIVE
Protein, ur: NEGATIVE mg/dL
Urobilinogen, UA: 0.2 mg/dL (ref 0.0–1.0)
pH: 5 (ref 5.0–8.0)

## 2011-11-13 MED ORDER — SODIUM CHLORIDE 0.9 % IV SOLN
Freq: Once | INTRAVENOUS | Status: AC
  Administered 2011-11-13: 13:00:00 via INTRAVENOUS

## 2011-11-13 MED ORDER — KETOROLAC TROMETHAMINE 30 MG/ML IJ SOLN
30.0000 mg | Freq: Once | INTRAMUSCULAR | Status: AC
  Administered 2011-11-13: 30 mg via INTRAVENOUS
  Filled 2011-11-13: qty 1

## 2011-11-13 MED ORDER — ONDANSETRON HCL 4 MG/2ML IJ SOLN
4.0000 mg | Freq: Once | INTRAMUSCULAR | Status: AC
  Administered 2011-11-13: 4 mg via INTRAVENOUS
  Filled 2011-11-13: qty 2

## 2011-11-13 NOTE — Discharge Instructions (Signed)
Diarrhea  Diarrhea is watery poop (stool). The most common cause of diarrhea is a germ. Other causes include:   Food poisoning.    A reaction to medicine.   HOME CARE     Drink clear fluids. This can stop you from losing too much body fluid (dehydration).    Drink enough fluids to keep your pee (urine) clear or pale yellow.    Avoid solid foods and dairy products until you start to feel better. Then start eating bland foods, such as:    Bananas.    Rice.    Crackers.    Applesauce.    Dry toast.    Avoid spicy foods, caffeine, and alcohol.    Your doctor may give medicine to help with cramps and watery poop. Take this as told. Avoid these medicines if you have a fever or blood in your poop.    Take your medicine as told. Finish them even if you start to feel better.   GET HELP RIGHT AWAY IF:     The watery poop lasts longer than 3 days.    You have a fever.    Your baby is older than 3 months with a rectal temperature of 100.5 F (38.1 C) or higher for more than 1 day.    There is blood in your poop.    You start to throw up (vomit).    You lose too much fluid.   MAKE SURE YOU:     Understand these instructions.    Will watch your condition.    Will get help right away if you are not doing well or get worse.   Document Released: 02/03/2008 Document Revised: 08/06/2011 Document Reviewed: 02/03/2008  ExitCare Patient Information 2012 [employer].

## 2011-11-13 NOTE — ED Notes (Signed)
Ambulatory to treatment room. Looks dehydrated. Heart rate 130.

## 2011-11-13 NOTE — ED Provider Notes (Signed)
History     CSN: [id number]  Arrival date & time 11/13/11  1224   First MD Initiated Contact with Patient 11/13/11 1250      Chief Complaint  Patient presents with  . Diarrhea    (Consider location/radiation/quality/duration/timing/severity/associated sxs/prior treatment) HPI Comments: N/v/d for one week.  No apetite and no energy  Patient is a 78 y.o. male presenting with diarrhea. The history is provided by the patient.  Diarrhea The primary symptoms include nausea, vomiting and diarrhea. Primary symptoms do not include fever. The illness began 6 to 7 days ago. The problem has been gradually worsening.  The illness does not include chills or back pain. Associated medical issues do not include inflammatory bowel disease or GERD.    Past Medical History  Diagnosis Date  . Diabetes mellitus     dr Lenise Arena  . Hyperlipidemia   . Hypertension   . Coronary artery disease     s/p CABG 1997  . GERD (gastroesophageal reflux disease)   . Arthritis   . Allergic rhinitis   . Hyperplastic colon polyp   . Diverticulitis, colon   . Meniere's syndrome   . Tremor, essential     dr Anne Hahn  . COPD, mild     dr clance  . MI (myocardial infarction) 1997, 2005  . Ischemic cardiomyopathy     sp ICD (SJM) implanted by Dr Johney Frame    Past Surgical History  Procedure Date  . Coronary artery bypass graft 1997    [place]  . Cardiac catheterization 2005    medical therapy  . Cardiac defibrillator placement     ICD implanted by Dr Johney Frame, Analyze ST study patient    Family History  Problem Relation Age of Onset  . Parkinsonism Father     History  Substance Use Topics  . Smoking status: Former Smoker -- 35 years    Types: Cigarettes    Quit date: 09/01/1995  . Smokeless tobacco: Not on file  . Alcohol Use: 0.0 oz/week     occasional scotch      Review of Systems  Constitutional: Negative for fever and chills.  Gastrointestinal: Positive for nausea, vomiting and diarrhea.    Musculoskeletal: Negative for back pain.  All other systems reviewed and are negative.    Allergies  Metformin and related and Ramipril  Home Medications   Current Outpatient Rx  Name Route Sig Dispense Refill  . ARTHRITIS PAIN RELIEF PO Oral Take 650 mg by mouth as needed.      . ASPIRIN 325 MG PO TABS Oral Take 325 mg by mouth daily.      Marland Kitchen BIMATOPROST 0.03 % OP SOLN Ophthalmic Apply 1 drop to eye every evening.      Marland Kitchen CETIRIZINE HCL 10 MG PO TABS Oral Take 5 mg by mouth daily.      Marland Kitchen VITAMIN D2 PO Oral Take 1 tablet by mouth 2 (two) times daily.    . OMEGA-3 FATTY ACIDS 1000 MG PO CAPS Oral Take 2 g by mouth daily.     Marland Kitchen FLUTICASONE PROPIONATE (INHAL) 100 MCG/BLIST IN AEPB Inhalation Inhale 1 puff into the lungs 2 (two) times daily.      Marland Kitchen GLIMEPIRIDE 4 MG PO TABS Oral Take 4 mg by mouth 2 (two) times daily.      Marland Kitchen SINUS RINSE NA PACK Nasal Place into the nose as needed.      Marland Kitchen METANX PO Oral Take 1 tablet by mouth 2 (two) times daily.    Marland Kitchen  LANSOPRAZOLE 30 MG PO CPDR Oral Take 30 mg by mouth as needed.      Marland Kitchen LOSARTAN POTASSIUM 100 MG PO TABS Oral Take 100 mg by mouth daily.      Marland Kitchen MECLIZINE HCL 25 MG PO TABS Oral Take 25 mg by mouth as needed.      Marland Kitchen METOPROLOL SUCCINATE ER 100 MG PO TB24 Oral Take 100 mg by mouth daily.      Marland Kitchen SIMVASTATIN 40 MG PO TABS Oral Take 40 mg by mouth at bedtime.      Marland Kitchen SITAGLIPTIN-METFORMIN HCL 50-500 MG PO TABS Oral Take 1 tablet by mouth 2 (two) times daily with a meal.      . TIOTROPIUM BROMIDE MONOHYDRATE 18 MCG IN CAPS Inhalation Place 18 mcg into inhaler and inhale as needed.        BP 127/88  Pulse 130  Temp(Src) 97.7 F (36.5 C) (Oral)  Resp 20  Wt 174 lb (78.926 kg)  SpO2 98%  Physical Exam  Nursing note and vitals reviewed. Constitutional: He is oriented to person, place, and time. He appears well-developed and well-nourished. No distress.  HENT:  Head: Normocephalic and atraumatic.  Mouth/Throat: Oropharynx is clear and moist.   Neck: Normal range of motion. Neck supple.  Cardiovascular: Normal rate and regular rhythm.   No murmur heard. Pulmonary/Chest: Effort normal and breath sounds normal. No respiratory distress.  Abdominal: Soft. Bowel sounds are normal. He exhibits no distension. There is no tenderness.  Musculoskeletal: Normal range of motion. He exhibits no edema.  Lymphadenopathy:    He has no cervical adenopathy.  Neurological: He is alert and oriented to person, place, and time.  Skin: Skin is warm and dry. He is not diaphoretic.    ED Course  Procedures (including critical care time)   Labs Reviewed  CBC  DIFFERENTIAL  COMPREHENSIVE METABOLIC PANEL   No results found.   No diagnosis found.    MDM  The patient has been having diarrhea for several days.  The electrolytes reflect a mild dehydration.  He was given ivf's and has had one dose of levaquin that was prescribed by pcp for this.  I will recommend he take the remaining antibiotics and give more time.  To return prn for bloody stool, abd pain, or decreased urine output.        Geoffery Lyons, MD 11/13/11 1434

## 2011-11-13 NOTE — ED Notes (Signed)
Diarrhea and vomiting for a week. Was seen by his MD yesterday for same and started on antibiotic. Feels no better today.

## 2011-12-01 DIAGNOSIS — I428 Other cardiomyopathies: Secondary | ICD-10-CM | POA: Diagnosis not present

## 2011-12-01 DIAGNOSIS — J449 Chronic obstructive pulmonary disease, unspecified: Secondary | ICD-10-CM | POA: Diagnosis not present

## 2011-12-01 DIAGNOSIS — R0989 Other specified symptoms and signs involving the circulatory and respiratory systems: Secondary | ICD-10-CM | POA: Diagnosis not present

## 2011-12-15 DIAGNOSIS — E039 Hypothyroidism, unspecified: Secondary | ICD-10-CM | POA: Diagnosis not present

## 2011-12-15 DIAGNOSIS — D539 Nutritional anemia, unspecified: Secondary | ICD-10-CM | POA: Diagnosis not present

## 2011-12-15 DIAGNOSIS — E1142 Type 2 diabetes mellitus with diabetic polyneuropathy: Secondary | ICD-10-CM | POA: Diagnosis not present

## 2011-12-15 DIAGNOSIS — D649 Anemia, unspecified: Secondary | ICD-10-CM | POA: Diagnosis not present

## 2011-12-21 DIAGNOSIS — R209 Unspecified disturbances of skin sensation: Secondary | ICD-10-CM | POA: Diagnosis not present

## 2012-01-07 ENCOUNTER — Ambulatory Visit (INDEPENDENT_AMBULATORY_CARE_PROVIDER_SITE_OTHER): Payer: Medicare Other | Admitting: *Deleted

## 2012-01-07 DIAGNOSIS — I428 Other cardiomyopathies: Secondary | ICD-10-CM

## 2012-01-07 DIAGNOSIS — R0989 Other specified symptoms and signs involving the circulatory and respiratory systems: Secondary | ICD-10-CM

## 2012-01-21 DIAGNOSIS — I10 Essential (primary) hypertension: Secondary | ICD-10-CM | POA: Diagnosis not present

## 2012-01-21 DIAGNOSIS — E782 Mixed hyperlipidemia: Secondary | ICD-10-CM | POA: Diagnosis not present

## 2012-01-21 DIAGNOSIS — E1149 Type 2 diabetes mellitus with other diabetic neurological complication: Secondary | ICD-10-CM | POA: Diagnosis not present

## 2012-01-21 DIAGNOSIS — E039 Hypothyroidism, unspecified: Secondary | ICD-10-CM | POA: Diagnosis not present

## 2012-01-21 DIAGNOSIS — E1142 Type 2 diabetes mellitus with diabetic polyneuropathy: Secondary | ICD-10-CM | POA: Diagnosis not present

## 2012-01-28 ENCOUNTER — Encounter: Payer: Self-pay | Admitting: Internal Medicine

## 2012-01-28 ENCOUNTER — Ambulatory Visit (INDEPENDENT_AMBULATORY_CARE_PROVIDER_SITE_OTHER): Payer: Medicare Other | Admitting: *Deleted

## 2012-01-28 DIAGNOSIS — I259 Chronic ischemic heart disease, unspecified: Secondary | ICD-10-CM | POA: Diagnosis not present

## 2012-01-29 LAB — REMOTE ICD DEVICE
HV IMPEDENCE: 77 Ohm
RV LEAD AMPLITUDE: 12 mv
RV LEAD IMPEDENCE ICD: 510 Ohm
VENTRICULAR PACING ICD: 1 pct

## 2012-02-10 ENCOUNTER — Encounter: Payer: Self-pay | Admitting: *Deleted

## 2012-02-17 DIAGNOSIS — E559 Vitamin D deficiency, unspecified: Secondary | ICD-10-CM | POA: Diagnosis not present

## 2012-02-17 DIAGNOSIS — N2581 Secondary hyperparathyroidism of renal origin: Secondary | ICD-10-CM | POA: Diagnosis not present

## 2012-02-25 ENCOUNTER — Telehealth: Payer: Self-pay | Admitting: *Deleted

## 2012-02-25 NOTE — Telephone Encounter (Signed)
Called patient to follow-up on a patient initiated transmission via [facility]. Patient had called [facility] 02/24/2012 notifying of a possible shock. Patient was asked to to send a transmission. The transmission was reviewed by Altha Harm and is was determined that no shock had been delivered. Informed patient of the findings.

## 2012-03-21 DIAGNOSIS — I951 Orthostatic hypotension: Secondary | ICD-10-CM | POA: Diagnosis not present

## 2012-03-22 DIAGNOSIS — R0602 Shortness of breath: Secondary | ICD-10-CM | POA: Diagnosis not present

## 2012-03-22 DIAGNOSIS — I951 Orthostatic hypotension: Secondary | ICD-10-CM | POA: Diagnosis not present

## 2012-03-22 DIAGNOSIS — I509 Heart failure, unspecified: Secondary | ICD-10-CM | POA: Diagnosis not present

## 2012-04-04 DIAGNOSIS — L0291 Cutaneous abscess, unspecified: Secondary | ICD-10-CM | POA: Diagnosis not present

## 2012-04-04 DIAGNOSIS — L0390 Cellulitis, unspecified: Secondary | ICD-10-CM | POA: Diagnosis not present

## 2012-04-12 DIAGNOSIS — R609 Edema, unspecified: Secondary | ICD-10-CM | POA: Diagnosis not present

## 2012-04-15 ENCOUNTER — Other Ambulatory Visit (HOSPITAL_BASED_OUTPATIENT_CLINIC_OR_DEPARTMENT_OTHER): Payer: Self-pay | Admitting: Family Medicine

## 2012-04-15 DIAGNOSIS — G3184 Mild cognitive impairment, so stated: Secondary | ICD-10-CM | POA: Diagnosis not present

## 2012-04-15 DIAGNOSIS — E039 Hypothyroidism, unspecified: Secondary | ICD-10-CM | POA: Diagnosis not present

## 2012-04-18 ENCOUNTER — Encounter (INDEPENDENT_AMBULATORY_CARE_PROVIDER_SITE_OTHER): Payer: Medicare Other

## 2012-04-18 ENCOUNTER — Encounter: Payer: Self-pay | Admitting: Internal Medicine

## 2012-04-18 ENCOUNTER — Ambulatory Visit (INDEPENDENT_AMBULATORY_CARE_PROVIDER_SITE_OTHER): Payer: Medicare Other | Admitting: Internal Medicine

## 2012-04-18 VITALS — BP 130/68 | HR 63 | Resp 18 | Ht 71.0 in | Wt 190.8 lb

## 2012-04-18 DIAGNOSIS — I259 Chronic ischemic heart disease, unspecified: Secondary | ICD-10-CM | POA: Diagnosis not present

## 2012-04-18 LAB — ICD DEVICE OBSERVATION
HV IMPEDENCE: 63 Ohm
RV LEAD IMPEDENCE ICD: 450 Ohm
RV LEAD THRESHOLD: 0.75 V
VENTRICULAR PACING ICD: 1 pct

## 2012-04-18 NOTE — Progress Notes (Signed)
PCP:  Joycelyn Rua, MD Primary Cardiologist:  Dr Eldridge Dace   The patient presents today for routine electrophysiology followup.  Since last being seen in our clinic, the patient reports doing reasonably well.  He recently has had worsening orthostasis.  He was evaluated by Dr Eliott Nine and his medicines were adjusted.  Since that time, his dizziness has improved.  He had an episode of postural syncope about 2 weeks ago.  He denies ICD shocks. Today, he denies symptoms of palpitations, chest pain, shortness of breath (above baseline), orthopnea, PND, or edema.  He is primarily limited by peripheral neuropathy. The patient feels that he is tolerating medications without difficulties and is otherwise without complaint today.   Past Medical History  Diagnosis Date  . Diabetes mellitus     dr Lenise Arena  . Hyperlipidemia   . Hypertension   . Coronary artery disease     s/p CABG 1997  . GERD (gastroesophageal reflux disease)   . Arthritis   . Allergic rhinitis   . Hyperplastic colon polyp   . Diverticulitis, colon   . Meniere's syndrome   . Tremor, essential     dr Anne Hahn  . COPD, mild     dr clance  . MI (myocardial infarction) 1997, 2005  . Ischemic cardiomyopathy     sp ICD (SJM) implanted by Dr Johney Frame   Past Surgical History  Procedure Date  . Coronary artery bypass graft 1997    [place]  . Cardiac catheterization 2005    medical therapy  . Cardiac defibrillator placement     ICD implanted by Dr Johney Frame, Analyze ST study patient    Current Outpatient Prescriptions  Medication Sig Dispense Refill  . Acetaminophen (ARTHRITIS PAIN RELIEF PO) Take 650 mg by mouth as needed.        Marland Kitchen aspirin 325 MG tablet Take 325 mg by mouth daily.        . bimatoprost (LUMIGAN) 0.03 % ophthalmic solution Apply 1 drop to eye every evening.        . cetirizine (ZYRTEC) 10 MG tablet Take 5 mg by mouth daily.        . DULoxetine (CYMBALTA) 60 MG capsule Take 60 mg by mouth daily.      . Ergocalciferol  (VITAMIN D2 PO) Take 1 tablet by mouth 2 (two) times daily.      . fish oil-omega-3 fatty acids 1000 MG capsule Take 2 g by mouth daily.       . Fluticasone Propionate, Inhal, (FLOVENT DISKUS) 100 MCG/BLIST AEPB Inhale 1 puff into the lungs 2 (two) times daily.        Marland Kitchen glimepiride (AMARYL) 4 MG tablet Take 4 mg by mouth 2 (two) times daily.        . Hypertonic Nasal Wash (SINUS RINSE) PACK Place into the nose as needed.        . lansoprazole (PREVACID) 30 MG capsule Take 30 mg by mouth as needed.        Marland Kitchen levothyroxine (SYNTHROID, LEVOTHROID) 25 MCG tablet Take 25 mcg by mouth daily.      . meclizine (ANTIVERT) 25 MG tablet Take 25 mg by mouth as needed.        . metoprolol (TOPROL-XL) 100 MG 24 hr tablet Take 100 mg by mouth daily.        . simvastatin (ZOCOR) 40 MG tablet Take 40 mg by mouth at bedtime.        . sitaGLIPtan-metformin (JANUMET) 50-500 MG per tablet Take  1 tablet by mouth 2 (two) times daily with a meal.        . tiotropium (SPIRIVA HANDIHALER) 18 MCG inhalation capsule Place 18 mcg into inhaler and inhale as needed.          Allergies  Allergen Reactions  . Metformin And Related     GI sensitivity with high doses  . Ramipril     REACTION: cough    History   Social History  . Marital Status: Married    Spouse Name: N/A    Number of Children: N/A  . Years of Education: N/A   Occupational History  . retired    Social History Main Topics  . Smoking status: Former Smoker -- 35 years    Types: Cigarettes    Quit date: 09/01/1995  . Smokeless tobacco: Not on file  . Alcohol Use: 0.0 oz/week     occasional scotch  . Drug Use: No  . Sexually Active: Not on file   Other Topics Concern  . 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 Parkinsonism Father      Physical Exam: Filed Vitals:   04/18/12 1108  BP: 130/68  Pulse: 63  Resp:  18  Height: 5\' 11"  (1.803 m)  Weight: 190 lb 12.8 oz (86.546 kg)  SpO2: 97%    GEN- The patient is well appearing, alert and oriented x 3 today.   Head- normocephalic, atraumatic Eyes-  Sclera clear, conjunctiva pink Ears- hearing intact Oropharynx- clear Neck- supple, no JVP Lymph- no cervical lymphadenopathy Lungs- Clear to ausculation bilaterally, normal work of breathing Chest- ICD pocket is well healed Heart- Regular rate and rhythm, no murmurs, rubs or gallops, PMI not laterally displaced GI- soft, NT, ND, + BS Extremities- no clubbing, cyanosis, or edema  ICD interrogation- reviewed in detail today,  See PACEART report  Assessment and Plan:

## 2012-04-18 NOTE — Patient Instructions (Addendum)
Remote monitoring is used to monitor your Pacemaker of ICD from home. This monitoring reduces the number of office visits required to check your device to one time per year. It allows [place] to keep an eye on the functioning of your device to ensure it is working properly. You are scheduled for a device check from home on July 25, 2012. You may send your transmission at any time that day. If you have a wireless device, the transmission will be sent automatically. After your physician reviews your transmission, you will receive a postcard with your next transmission date.  Your physician wants you to follow-up in: 1 year with Dr Johney Frame.  You will receive a reminder letter in the mail two months in advance. If you don't receive a letter, please call our office to schedule the follow-up appointment.

## 2012-04-18 NOTE — Assessment & Plan Note (Signed)
Normal ICD function See Arita Miss Art report No changes today  He appears slightly dry on exam today, though clinically orthostasis is improved. I have encouraged adequate hydration. As lasix was recently decreased by Dr Eliott Nine, I have made no changes today. He has scheduled follow-up with Dr Eldridge Dace.  I have instructed him clearly that he should not drive until orthostasis is completely resolved.

## 2012-04-18 NOTE — Addendum Note (Signed)
Addended by: Worthy Rancher D on: 04/18/2012 02:04 PM   Modules accepted: Orders

## 2012-04-19 ENCOUNTER — Ambulatory Visit (HOSPITAL_BASED_OUTPATIENT_CLINIC_OR_DEPARTMENT_OTHER)
Admission: RE | Admit: 2012-04-19 | Discharge: 2012-04-19 | Disposition: A | Discharge status: Home / Self Care | Payer: Medicare Other | Source: Ambulatory Visit | Attending: Family Medicine | Admitting: Family Medicine

## 2012-04-19 DIAGNOSIS — R42 Dizziness and giddiness: Secondary | ICD-10-CM | POA: Insufficient documentation

## 2012-04-19 DIAGNOSIS — E119 Type 2 diabetes mellitus without complications: Secondary | ICD-10-CM | POA: Diagnosis not present

## 2012-04-19 DIAGNOSIS — G319 Degenerative disease of nervous system, unspecified: Secondary | ICD-10-CM | POA: Diagnosis not present

## 2012-04-19 DIAGNOSIS — I10 Essential (primary) hypertension: Secondary | ICD-10-CM | POA: Insufficient documentation

## 2012-04-19 DIAGNOSIS — G3184 Mild cognitive impairment, so stated: Secondary | ICD-10-CM

## 2012-04-25 DIAGNOSIS — F0390 Unspecified dementia without behavioral disturbance: Secondary | ICD-10-CM | POA: Diagnosis not present

## 2012-05-03 DIAGNOSIS — I10 Essential (primary) hypertension: Secondary | ICD-10-CM | POA: Diagnosis not present

## 2012-05-03 DIAGNOSIS — I2510 Atherosclerotic heart disease of native coronary artery without angina pectoris: Secondary | ICD-10-CM | POA: Diagnosis not present

## 2012-05-03 DIAGNOSIS — E782 Mixed hyperlipidemia: Secondary | ICD-10-CM | POA: Diagnosis not present

## 2012-05-03 DIAGNOSIS — I428 Other cardiomyopathies: Secondary | ICD-10-CM | POA: Diagnosis not present

## 2012-05-05 DIAGNOSIS — I129 Hypertensive chronic kidney disease with stage 1 through stage 4 chronic kidney disease, or unspecified chronic kidney disease: Secondary | ICD-10-CM | POA: Diagnosis not present

## 2012-05-05 DIAGNOSIS — N184 Chronic kidney disease, stage 4 (severe): Secondary | ICD-10-CM | POA: Diagnosis not present

## 2012-05-09 DIAGNOSIS — E875 Hyperkalemia: Secondary | ICD-10-CM | POA: Diagnosis not present

## 2012-05-25 DIAGNOSIS — H409 Unspecified glaucoma: Secondary | ICD-10-CM | POA: Diagnosis not present

## 2012-05-25 DIAGNOSIS — H4011X0 Primary open-angle glaucoma, stage unspecified: Secondary | ICD-10-CM | POA: Diagnosis not present

## 2012-06-07 DIAGNOSIS — Z23 Encounter for immunization: Secondary | ICD-10-CM | POA: Diagnosis not present

## 2012-06-27 DIAGNOSIS — I10 Essential (primary) hypertension: Secondary | ICD-10-CM | POA: Diagnosis not present

## 2012-06-27 DIAGNOSIS — E782 Mixed hyperlipidemia: Secondary | ICD-10-CM | POA: Diagnosis not present

## 2012-06-27 DIAGNOSIS — E1149 Type 2 diabetes mellitus with other diabetic neurological complication: Secondary | ICD-10-CM | POA: Diagnosis not present

## 2012-06-27 DIAGNOSIS — E1142 Type 2 diabetes mellitus with diabetic polyneuropathy: Secondary | ICD-10-CM | POA: Diagnosis not present

## 2012-07-25 ENCOUNTER — Encounter: Payer: Medicare Other | Admitting: *Deleted

## 2012-07-27 ENCOUNTER — Encounter: Payer: Self-pay | Admitting: *Deleted

## 2012-08-01 ENCOUNTER — Ambulatory Visit (INDEPENDENT_AMBULATORY_CARE_PROVIDER_SITE_OTHER): Payer: Medicare Other | Admitting: *Deleted

## 2012-08-01 ENCOUNTER — Telehealth: Payer: Self-pay | Admitting: Internal Medicine

## 2012-08-01 ENCOUNTER — Encounter: Payer: Self-pay | Admitting: Internal Medicine

## 2012-08-01 DIAGNOSIS — Z95810 Presence of automatic (implantable) cardiac defibrillator: Secondary | ICD-10-CM

## 2012-08-01 DIAGNOSIS — I259 Chronic ischemic heart disease, unspecified: Secondary | ICD-10-CM

## 2012-08-01 NOTE — Telephone Encounter (Signed)
Pt wife called [phone] stated pt sent in transmission for missed remote appnt (07/25/12) plz call if you did not receive it or have an issue.

## 2012-08-01 NOTE — Telephone Encounter (Signed)
Spoke w/pt's wife and aware that transmission was received.

## 2012-08-02 ENCOUNTER — Encounter: Payer: Self-pay | Admitting: *Deleted

## 2012-08-02 DIAGNOSIS — Z95810 Presence of automatic (implantable) cardiac defibrillator: Secondary | ICD-10-CM | POA: Insufficient documentation

## 2012-08-02 LAB — REMOTE ICD DEVICE
BRDY-0002RV: 40 {beats}/min
DEV-0020ICD: NEGATIVE
RV LEAD AMPLITUDE: 12 mv
RV LEAD IMPEDENCE ICD: 440 Ohm
TZON-0005SLOWVT: 6

## 2012-08-10 ENCOUNTER — Encounter: Payer: Self-pay | Admitting: *Deleted

## 2012-08-10 DIAGNOSIS — N2581 Secondary hyperparathyroidism of renal origin: Secondary | ICD-10-CM | POA: Diagnosis not present

## 2012-08-10 DIAGNOSIS — I951 Orthostatic hypotension: Secondary | ICD-10-CM | POA: Diagnosis not present

## 2012-09-27 DIAGNOSIS — I10 Essential (primary) hypertension: Secondary | ICD-10-CM | POA: Diagnosis not present

## 2012-09-27 DIAGNOSIS — K219 Gastro-esophageal reflux disease without esophagitis: Secondary | ICD-10-CM | POA: Diagnosis not present

## 2012-09-27 DIAGNOSIS — J309 Allergic rhinitis, unspecified: Secondary | ICD-10-CM | POA: Diagnosis not present

## 2012-09-27 DIAGNOSIS — G3184 Mild cognitive impairment, so stated: Secondary | ICD-10-CM | POA: Diagnosis not present

## 2012-09-27 DIAGNOSIS — E039 Hypothyroidism, unspecified: Secondary | ICD-10-CM | POA: Diagnosis not present

## 2012-09-27 DIAGNOSIS — E1142 Type 2 diabetes mellitus with diabetic polyneuropathy: Secondary | ICD-10-CM | POA: Diagnosis not present

## 2012-09-27 DIAGNOSIS — E559 Vitamin D deficiency, unspecified: Secondary | ICD-10-CM | POA: Diagnosis not present

## 2012-09-29 DIAGNOSIS — H409 Unspecified glaucoma: Secondary | ICD-10-CM | POA: Diagnosis not present

## 2012-09-29 DIAGNOSIS — E119 Type 2 diabetes mellitus without complications: Secondary | ICD-10-CM | POA: Diagnosis not present

## 2012-09-29 DIAGNOSIS — H4011X0 Primary open-angle glaucoma, stage unspecified: Secondary | ICD-10-CM | POA: Diagnosis not present

## 2012-10-18 ENCOUNTER — Ambulatory Visit (INDEPENDENT_AMBULATORY_CARE_PROVIDER_SITE_OTHER): Payer: Medicare Other | Admitting: *Deleted

## 2012-10-18 ENCOUNTER — Encounter: Payer: Self-pay | Admitting: Internal Medicine

## 2012-10-18 ENCOUNTER — Encounter (INDEPENDENT_AMBULATORY_CARE_PROVIDER_SITE_OTHER): Payer: Medicare Other

## 2012-10-18 DIAGNOSIS — I428 Other cardiomyopathies: Secondary | ICD-10-CM

## 2012-10-18 DIAGNOSIS — R0989 Other specified symptoms and signs involving the circulatory and respiratory systems: Secondary | ICD-10-CM

## 2012-11-07 DIAGNOSIS — I2510 Atherosclerotic heart disease of native coronary artery without angina pectoris: Secondary | ICD-10-CM | POA: Diagnosis not present

## 2012-11-07 DIAGNOSIS — I428 Other cardiomyopathies: Secondary | ICD-10-CM | POA: Diagnosis not present

## 2012-11-07 DIAGNOSIS — E782 Mixed hyperlipidemia: Secondary | ICD-10-CM | POA: Diagnosis not present

## 2012-11-07 DIAGNOSIS — I10 Essential (primary) hypertension: Secondary | ICD-10-CM | POA: Diagnosis not present

## 2012-11-08 DIAGNOSIS — N2581 Secondary hyperparathyroidism of renal origin: Secondary | ICD-10-CM | POA: Diagnosis not present

## 2012-11-08 DIAGNOSIS — I951 Orthostatic hypotension: Secondary | ICD-10-CM | POA: Diagnosis not present

## 2012-12-08 NOTE — Progress Notes (Signed)
icd check in clinic by [facility].

## 2012-12-21 DIAGNOSIS — I10 Essential (primary) hypertension: Secondary | ICD-10-CM | POA: Diagnosis not present

## 2012-12-21 DIAGNOSIS — E1149 Type 2 diabetes mellitus with other diabetic neurological complication: Secondary | ICD-10-CM | POA: Diagnosis not present

## 2012-12-21 DIAGNOSIS — E782 Mixed hyperlipidemia: Secondary | ICD-10-CM | POA: Diagnosis not present

## 2012-12-21 DIAGNOSIS — E1142 Type 2 diabetes mellitus with diabetic polyneuropathy: Secondary | ICD-10-CM | POA: Diagnosis not present

## 2013-01-23 LAB — ICD DEVICE OBSERVATION
DEV-0020ICD: NEGATIVE
DEVICE MODEL ICD: 1016528
TZON-0005SLOWVT: 6

## 2013-02-02 DIAGNOSIS — H4011X0 Primary open-angle glaucoma, stage unspecified: Secondary | ICD-10-CM | POA: Diagnosis not present

## 2013-02-02 DIAGNOSIS — H409 Unspecified glaucoma: Secondary | ICD-10-CM | POA: Diagnosis not present

## 2013-02-06 ENCOUNTER — Encounter: Payer: Self-pay | Admitting: Internal Medicine

## 2013-02-06 ENCOUNTER — Ambulatory Visit (INDEPENDENT_AMBULATORY_CARE_PROVIDER_SITE_OTHER): Payer: Medicare Other | Admitting: *Deleted

## 2013-02-06 DIAGNOSIS — I259 Chronic ischemic heart disease, unspecified: Secondary | ICD-10-CM | POA: Diagnosis not present

## 2013-02-06 DIAGNOSIS — Z95810 Presence of automatic (implantable) cardiac defibrillator: Secondary | ICD-10-CM | POA: Diagnosis not present

## 2013-02-07 LAB — REMOTE ICD DEVICE
BRDY-0002RV: 40 {beats}/min
DEVICE MODEL ICD: 1016528
RV LEAD AMPLITUDE: 12 mv
VENTRICULAR PACING ICD: 1 pct

## 2013-02-27 ENCOUNTER — Encounter: Payer: Self-pay | Admitting: *Deleted

## 2013-03-16 DIAGNOSIS — E1149 Type 2 diabetes mellitus with other diabetic neurological complication: Secondary | ICD-10-CM | POA: Diagnosis not present

## 2013-03-16 DIAGNOSIS — G3184 Mild cognitive impairment, so stated: Secondary | ICD-10-CM | POA: Diagnosis not present

## 2013-03-16 DIAGNOSIS — N2581 Secondary hyperparathyroidism of renal origin: Secondary | ICD-10-CM | POA: Diagnosis not present

## 2013-03-16 DIAGNOSIS — E039 Hypothyroidism, unspecified: Secondary | ICD-10-CM | POA: Diagnosis not present

## 2013-03-16 DIAGNOSIS — I10 Essential (primary) hypertension: Secondary | ICD-10-CM | POA: Diagnosis not present

## 2013-03-16 DIAGNOSIS — I951 Orthostatic hypotension: Secondary | ICD-10-CM | POA: Diagnosis not present

## 2013-03-16 DIAGNOSIS — K219 Gastro-esophageal reflux disease without esophagitis: Secondary | ICD-10-CM | POA: Diagnosis not present

## 2013-03-16 DIAGNOSIS — N184 Chronic kidney disease, stage 4 (severe): Secondary | ICD-10-CM | POA: Diagnosis not present

## 2013-04-03 ENCOUNTER — Encounter: Payer: Self-pay | Admitting: Internal Medicine

## 2013-04-03 ENCOUNTER — Ambulatory Visit (INDEPENDENT_AMBULATORY_CARE_PROVIDER_SITE_OTHER): Payer: Medicare Other | Admitting: Internal Medicine

## 2013-04-03 VITALS — BP 84/60 | HR 75 | Wt 190.6 lb

## 2013-04-03 DIAGNOSIS — Z95810 Presence of automatic (implantable) cardiac defibrillator: Secondary | ICD-10-CM | POA: Diagnosis not present

## 2013-04-03 DIAGNOSIS — I509 Heart failure, unspecified: Secondary | ICD-10-CM

## 2013-04-03 DIAGNOSIS — I5022 Chronic systolic (congestive) heart failure: Secondary | ICD-10-CM | POA: Diagnosis not present

## 2013-04-03 DIAGNOSIS — I259 Chronic ischemic heart disease, unspecified: Secondary | ICD-10-CM | POA: Diagnosis not present

## 2013-04-03 LAB — ICD DEVICE OBSERVATION
BRDY-0002RV: 40 {beats}/min
DEVICE MODEL ICD: 1016528
FVT: 0
HV IMPEDENCE: 63 Ohm
RV LEAD AMPLITUDE: 12 mv
TOT-0007: 1
TOT-0010: 6
TZON-0010SLOWVT: 40 ms
VENTRICULAR PACING ICD: 0.24 pct
VF: 0

## 2013-04-03 NOTE — Progress Notes (Signed)
PCP: Lenora Boys, MD Primary Cardiologist:  Patrick Boyle is a 79 y.o. male who presents today for routine electrophysiology followup.  Since last being seen in our clinic, the patient reports doing reasonably well.  He is still having problems with orthostatic intolerance.    Today, he denies symptoms of palpitations, chest pain, shortness of breath,  lower extremity edema, presyncope, syncope, or ICD shocks.  The patient is otherwise without complaint today.   Past Medical History  Diagnosis Date  . Diabetes mellitus     dr Lenise Arena  . Hyperlipidemia   . Hypertension   . Coronary artery disease     s/p CABG 1997  . GERD (gastroesophageal reflux disease)   . Arthritis   . Allergic rhinitis   . Hyperplastic colon polyp   . Diverticulitis, colon   . Meniere's syndrome   . Tremor, essential     dr Anne Hahn  . COPD, mild     dr clance  . MI (myocardial infarction) 1997, 2005  . Ischemic cardiomyopathy     sp ICD (SJM) implanted by Dr Johney Frame   Past Surgical History  Procedure Laterality Date  . Coronary artery bypass graft  1997    [place]  . Cardiac catheterization  2005    medical therapy  . Cardiac defibrillator placement      ICD implanted by Dr Johney Frame, Analyze ST study patient    Current Outpatient Prescriptions  Medication Sig Dispense Refill  . Acetaminophen (ARTHRITIS PAIN RELIEF PO) Take 650 mg by mouth as needed.        Marland Kitchen aspirin 325 MG tablet Take 325 mg by mouth daily.        . bimatoprost (LUMIGAN) 0.03 % ophthalmic solution Apply 1 drop to eye every evening.        . cetirizine (ZYRTEC) 10 MG tablet Take 5 mg by mouth daily.        Marland Kitchen donepezil (ARICEPT ODT) 10 MG disintegrating tablet Take 10 mg by mouth at bedtime.      . DULoxetine (CYMBALTA) 60 MG capsule Take 60 mg by mouth 2 (two) times daily.       . Ergocalciferol (VITAMIN D2 PO) Take 1 tablet by mouth 2 (two) times daily.      . fish oil-omega-3 fatty acids 1000 MG capsule Take 2 g by mouth 2 (two) times  daily.       . Fluticasone Propionate, Inhal, (FLOVENT DISKUS) 100 MCG/BLIST AEPB Inhale 1 puff into the lungs daily.       . furosemide (LASIX) 20 MG tablet Take 20 mg by mouth daily.      Marland Kitchen glimepiride (AMARYL) 4 MG tablet Take 4 mg by mouth 2 (two) times daily.        . Hypertonic Nasal Wash (SINUS RINSE) PACK Place into the nose as needed.        . lansoprazole (PREVACID) 30 MG capsule Take 30 mg by mouth as needed.        Marland Kitchen levothyroxine (SYNTHROID, LEVOTHROID) 25 MCG tablet Take 25 mcg by mouth daily.      . meclizine (ANTIVERT) 25 MG tablet Take 25 mg by mouth as needed.        . metoprolol tartrate (LOPRESSOR) 25 MG tablet Take 25 mg by mouth 2 (two) times daily. 1/2 tab in the am and 1/2 tab in the pm.      . simvastatin (ZOCOR) 40 MG tablet Take 40 mg by mouth at bedtime.        Marland Kitchen  sitaGLIPtan-metformin (JANUMET) 50-500 MG per tablet Take 1 tablet by mouth 2 (two) times daily with a meal.         No current facility-administered medications for this visit.    Physical Exam: Filed Vitals:   04/03/13 1027 04/03/13 1028  BP: 108/68 84/60  Pulse: 75   Weight: 190 lb 9.6 oz (86.456 kg)     GEN- The patient is well appearing, alert and oriented x 3 today.   Head- normocephalic, atraumatic Eyes-  Sclera clear, conjunctiva pink Ears- hearing intact Oropharynx- clear Lungs- Clear to ausculation bilaterally, normal work of breathing Chest- ICD pocket is well healed Heart- Regular rate and rhythm, no murmurs, rubs or gallops, PMI not laterally displaced GI- soft, NT, ND, + BS Extremities- no clubbing, cyanosis, or edema  ICD interrogation- reviewed in detail today,  See PACEART report  Assessment and Plan:  1.  Chronic systolic dysfunction euvolemic today Stable on an appropriate medical regimen Normal ICD function See Pace Art report No changes today  2. Postural hypotension  He remains quite orthostatic Adequate hydration is encouraged.  He is instructed to avoid heat  exposure Dr Eliott Nine in following his diuresis  Merlin Return to see the EP PA in 6 months I will see in a year

## 2013-04-03 NOTE — Patient Instructions (Addendum)
Remote monitoring is used to monitor your Pacemaker of ICD from home. This monitoring reduces the number of office visits required to check your device to one time per year. It allows [place] to keep an eye on the functioning of your device to ensure it is working properly. You are scheduled for a device check from home on July 03, 2013. You may send your transmission at any time that day. If you have a wireless device, the transmission will be sent automatically. After your physician reviews your transmission, you will receive a postcard with your next transmission date.  Your physician wants you to follow-up in: 6 months with Rick Duff, PA and 1 year with Dr Johney Frame.  You will receive a reminder letter in the mail two months in advance. If you don't receive a letter, please call our office to schedule the follow-up appointment.

## 2013-04-24 ENCOUNTER — Encounter: Payer: Medicare Other | Admitting: Internal Medicine

## 2013-04-24 ENCOUNTER — Ambulatory Visit: Payer: Medicare Other | Admitting: Internal Medicine

## 2013-05-10 DIAGNOSIS — I428 Other cardiomyopathies: Secondary | ICD-10-CM | POA: Diagnosis not present

## 2013-05-10 DIAGNOSIS — E782 Mixed hyperlipidemia: Secondary | ICD-10-CM | POA: Diagnosis not present

## 2013-05-10 DIAGNOSIS — I10 Essential (primary) hypertension: Secondary | ICD-10-CM | POA: Diagnosis not present

## 2013-05-10 DIAGNOSIS — I2510 Atherosclerotic heart disease of native coronary artery without angina pectoris: Secondary | ICD-10-CM | POA: Diagnosis not present

## 2013-05-18 DIAGNOSIS — M25559 Pain in unspecified hip: Secondary | ICD-10-CM | POA: Diagnosis not present

## 2013-05-18 DIAGNOSIS — G608 Other hereditary and idiopathic neuropathies: Secondary | ICD-10-CM | POA: Diagnosis not present

## 2013-05-18 DIAGNOSIS — M5137 Other intervertebral disc degeneration, lumbosacral region: Secondary | ICD-10-CM | POA: Diagnosis not present

## 2013-05-18 DIAGNOSIS — M999 Biomechanical lesion, unspecified: Secondary | ICD-10-CM | POA: Diagnosis not present

## 2013-05-18 DIAGNOSIS — IMO0002 Reserved for concepts with insufficient information to code with codable children: Secondary | ICD-10-CM | POA: Diagnosis not present

## 2013-05-23 DIAGNOSIS — M25559 Pain in unspecified hip: Secondary | ICD-10-CM | POA: Diagnosis not present

## 2013-05-23 DIAGNOSIS — IMO0002 Reserved for concepts with insufficient information to code with codable children: Secondary | ICD-10-CM | POA: Diagnosis not present

## 2013-05-23 DIAGNOSIS — M5137 Other intervertebral disc degeneration, lumbosacral region: Secondary | ICD-10-CM | POA: Diagnosis not present

## 2013-05-23 DIAGNOSIS — G608 Other hereditary and idiopathic neuropathies: Secondary | ICD-10-CM | POA: Diagnosis not present

## 2013-05-23 DIAGNOSIS — M999 Biomechanical lesion, unspecified: Secondary | ICD-10-CM | POA: Diagnosis not present

## 2013-05-26 DIAGNOSIS — M25559 Pain in unspecified hip: Secondary | ICD-10-CM | POA: Diagnosis not present

## 2013-05-26 DIAGNOSIS — M999 Biomechanical lesion, unspecified: Secondary | ICD-10-CM | POA: Diagnosis not present

## 2013-05-26 DIAGNOSIS — M5137 Other intervertebral disc degeneration, lumbosacral region: Secondary | ICD-10-CM | POA: Diagnosis not present

## 2013-05-26 DIAGNOSIS — G608 Other hereditary and idiopathic neuropathies: Secondary | ICD-10-CM | POA: Diagnosis not present

## 2013-05-26 DIAGNOSIS — IMO0002 Reserved for concepts with insufficient information to code with codable children: Secondary | ICD-10-CM | POA: Diagnosis not present

## 2013-05-29 DIAGNOSIS — IMO0002 Reserved for concepts with insufficient information to code with codable children: Secondary | ICD-10-CM | POA: Diagnosis not present

## 2013-05-29 DIAGNOSIS — M25559 Pain in unspecified hip: Secondary | ICD-10-CM | POA: Diagnosis not present

## 2013-05-29 DIAGNOSIS — M5137 Other intervertebral disc degeneration, lumbosacral region: Secondary | ICD-10-CM | POA: Diagnosis not present

## 2013-05-29 DIAGNOSIS — G608 Other hereditary and idiopathic neuropathies: Secondary | ICD-10-CM | POA: Diagnosis not present

## 2013-05-29 DIAGNOSIS — M999 Biomechanical lesion, unspecified: Secondary | ICD-10-CM | POA: Diagnosis not present

## 2013-05-31 DIAGNOSIS — M999 Biomechanical lesion, unspecified: Secondary | ICD-10-CM | POA: Diagnosis not present

## 2013-05-31 DIAGNOSIS — H6120 Impacted cerumen, unspecified ear: Secondary | ICD-10-CM | POA: Diagnosis not present

## 2013-05-31 DIAGNOSIS — M5137 Other intervertebral disc degeneration, lumbosacral region: Secondary | ICD-10-CM | POA: Diagnosis not present

## 2013-05-31 DIAGNOSIS — M25559 Pain in unspecified hip: Secondary | ICD-10-CM | POA: Diagnosis not present

## 2013-05-31 DIAGNOSIS — G608 Other hereditary and idiopathic neuropathies: Secondary | ICD-10-CM | POA: Diagnosis not present

## 2013-05-31 DIAGNOSIS — IMO0002 Reserved for concepts with insufficient information to code with codable children: Secondary | ICD-10-CM | POA: Diagnosis not present

## 2013-05-31 DIAGNOSIS — Z23 Encounter for immunization: Secondary | ICD-10-CM | POA: Diagnosis not present

## 2013-06-01 DIAGNOSIS — M999 Biomechanical lesion, unspecified: Secondary | ICD-10-CM | POA: Diagnosis not present

## 2013-06-01 DIAGNOSIS — M5137 Other intervertebral disc degeneration, lumbosacral region: Secondary | ICD-10-CM | POA: Diagnosis not present

## 2013-06-01 DIAGNOSIS — IMO0002 Reserved for concepts with insufficient information to code with codable children: Secondary | ICD-10-CM | POA: Diagnosis not present

## 2013-06-01 DIAGNOSIS — G608 Other hereditary and idiopathic neuropathies: Secondary | ICD-10-CM | POA: Diagnosis not present

## 2013-06-01 DIAGNOSIS — M25559 Pain in unspecified hip: Secondary | ICD-10-CM | POA: Diagnosis not present

## 2013-06-05 DIAGNOSIS — M25559 Pain in unspecified hip: Secondary | ICD-10-CM | POA: Diagnosis not present

## 2013-06-05 DIAGNOSIS — M5137 Other intervertebral disc degeneration, lumbosacral region: Secondary | ICD-10-CM | POA: Diagnosis not present

## 2013-06-05 DIAGNOSIS — M999 Biomechanical lesion, unspecified: Secondary | ICD-10-CM | POA: Diagnosis not present

## 2013-06-05 DIAGNOSIS — G608 Other hereditary and idiopathic neuropathies: Secondary | ICD-10-CM | POA: Diagnosis not present

## 2013-06-05 DIAGNOSIS — IMO0002 Reserved for concepts with insufficient information to code with codable children: Secondary | ICD-10-CM | POA: Diagnosis not present

## 2013-06-05 DIAGNOSIS — E1149 Type 2 diabetes mellitus with other diabetic neurological complication: Secondary | ICD-10-CM | POA: Diagnosis not present

## 2013-06-05 DIAGNOSIS — E1142 Type 2 diabetes mellitus with diabetic polyneuropathy: Secondary | ICD-10-CM | POA: Diagnosis not present

## 2013-06-05 DIAGNOSIS — I10 Essential (primary) hypertension: Secondary | ICD-10-CM | POA: Diagnosis not present

## 2013-06-06 DIAGNOSIS — M999 Biomechanical lesion, unspecified: Secondary | ICD-10-CM | POA: Diagnosis not present

## 2013-06-06 DIAGNOSIS — M5137 Other intervertebral disc degeneration, lumbosacral region: Secondary | ICD-10-CM | POA: Diagnosis not present

## 2013-06-06 DIAGNOSIS — M25559 Pain in unspecified hip: Secondary | ICD-10-CM | POA: Diagnosis not present

## 2013-06-06 DIAGNOSIS — IMO0002 Reserved for concepts with insufficient information to code with codable children: Secondary | ICD-10-CM | POA: Diagnosis not present

## 2013-06-06 DIAGNOSIS — G608 Other hereditary and idiopathic neuropathies: Secondary | ICD-10-CM | POA: Diagnosis not present

## 2013-06-07 DIAGNOSIS — M5137 Other intervertebral disc degeneration, lumbosacral region: Secondary | ICD-10-CM | POA: Diagnosis not present

## 2013-06-07 DIAGNOSIS — IMO0002 Reserved for concepts with insufficient information to code with codable children: Secondary | ICD-10-CM | POA: Diagnosis not present

## 2013-06-07 DIAGNOSIS — M999 Biomechanical lesion, unspecified: Secondary | ICD-10-CM | POA: Diagnosis not present

## 2013-06-07 DIAGNOSIS — G608 Other hereditary and idiopathic neuropathies: Secondary | ICD-10-CM | POA: Diagnosis not present

## 2013-06-07 DIAGNOSIS — M25559 Pain in unspecified hip: Secondary | ICD-10-CM | POA: Diagnosis not present

## 2013-06-08 DIAGNOSIS — H409 Unspecified glaucoma: Secondary | ICD-10-CM | POA: Diagnosis not present

## 2013-06-08 DIAGNOSIS — H4011X0 Primary open-angle glaucoma, stage unspecified: Secondary | ICD-10-CM | POA: Diagnosis not present

## 2013-06-12 DIAGNOSIS — IMO0002 Reserved for concepts with insufficient information to code with codable children: Secondary | ICD-10-CM | POA: Diagnosis not present

## 2013-06-12 DIAGNOSIS — G608 Other hereditary and idiopathic neuropathies: Secondary | ICD-10-CM | POA: Diagnosis not present

## 2013-06-12 DIAGNOSIS — M999 Biomechanical lesion, unspecified: Secondary | ICD-10-CM | POA: Diagnosis not present

## 2013-06-12 DIAGNOSIS — M5137 Other intervertebral disc degeneration, lumbosacral region: Secondary | ICD-10-CM | POA: Diagnosis not present

## 2013-06-12 DIAGNOSIS — M25559 Pain in unspecified hip: Secondary | ICD-10-CM | POA: Diagnosis not present

## 2013-06-14 DIAGNOSIS — M25559 Pain in unspecified hip: Secondary | ICD-10-CM | POA: Diagnosis not present

## 2013-06-14 DIAGNOSIS — M999 Biomechanical lesion, unspecified: Secondary | ICD-10-CM | POA: Diagnosis not present

## 2013-06-14 DIAGNOSIS — M5137 Other intervertebral disc degeneration, lumbosacral region: Secondary | ICD-10-CM | POA: Diagnosis not present

## 2013-06-14 DIAGNOSIS — G608 Other hereditary and idiopathic neuropathies: Secondary | ICD-10-CM | POA: Diagnosis not present

## 2013-06-14 DIAGNOSIS — IMO0002 Reserved for concepts with insufficient information to code with codable children: Secondary | ICD-10-CM | POA: Diagnosis not present

## 2013-06-15 DIAGNOSIS — M5137 Other intervertebral disc degeneration, lumbosacral region: Secondary | ICD-10-CM | POA: Diagnosis not present

## 2013-06-15 DIAGNOSIS — M25559 Pain in unspecified hip: Secondary | ICD-10-CM | POA: Diagnosis not present

## 2013-06-15 DIAGNOSIS — M999 Biomechanical lesion, unspecified: Secondary | ICD-10-CM | POA: Diagnosis not present

## 2013-06-15 DIAGNOSIS — IMO0002 Reserved for concepts with insufficient information to code with codable children: Secondary | ICD-10-CM | POA: Diagnosis not present

## 2013-06-15 DIAGNOSIS — G608 Other hereditary and idiopathic neuropathies: Secondary | ICD-10-CM | POA: Diagnosis not present

## 2013-06-19 DIAGNOSIS — IMO0002 Reserved for concepts with insufficient information to code with codable children: Secondary | ICD-10-CM | POA: Diagnosis not present

## 2013-06-19 DIAGNOSIS — G608 Other hereditary and idiopathic neuropathies: Secondary | ICD-10-CM | POA: Diagnosis not present

## 2013-06-19 DIAGNOSIS — M25559 Pain in unspecified hip: Secondary | ICD-10-CM | POA: Diagnosis not present

## 2013-06-19 DIAGNOSIS — M5137 Other intervertebral disc degeneration, lumbosacral region: Secondary | ICD-10-CM | POA: Diagnosis not present

## 2013-06-19 DIAGNOSIS — M999 Biomechanical lesion, unspecified: Secondary | ICD-10-CM | POA: Diagnosis not present

## 2013-06-21 DIAGNOSIS — IMO0002 Reserved for concepts with insufficient information to code with codable children: Secondary | ICD-10-CM | POA: Diagnosis not present

## 2013-06-21 DIAGNOSIS — M25559 Pain in unspecified hip: Secondary | ICD-10-CM | POA: Diagnosis not present

## 2013-06-21 DIAGNOSIS — M5137 Other intervertebral disc degeneration, lumbosacral region: Secondary | ICD-10-CM | POA: Diagnosis not present

## 2013-06-21 DIAGNOSIS — G608 Other hereditary and idiopathic neuropathies: Secondary | ICD-10-CM | POA: Diagnosis not present

## 2013-06-21 DIAGNOSIS — M999 Biomechanical lesion, unspecified: Secondary | ICD-10-CM | POA: Diagnosis not present

## 2013-06-26 DIAGNOSIS — M25559 Pain in unspecified hip: Secondary | ICD-10-CM | POA: Diagnosis not present

## 2013-06-26 DIAGNOSIS — IMO0002 Reserved for concepts with insufficient information to code with codable children: Secondary | ICD-10-CM | POA: Diagnosis not present

## 2013-06-26 DIAGNOSIS — M5137 Other intervertebral disc degeneration, lumbosacral region: Secondary | ICD-10-CM | POA: Diagnosis not present

## 2013-06-26 DIAGNOSIS — M999 Biomechanical lesion, unspecified: Secondary | ICD-10-CM | POA: Diagnosis not present

## 2013-06-26 DIAGNOSIS — G608 Other hereditary and idiopathic neuropathies: Secondary | ICD-10-CM | POA: Diagnosis not present

## 2013-06-29 DIAGNOSIS — M5137 Other intervertebral disc degeneration, lumbosacral region: Secondary | ICD-10-CM | POA: Diagnosis not present

## 2013-06-29 DIAGNOSIS — IMO0002 Reserved for concepts with insufficient information to code with codable children: Secondary | ICD-10-CM | POA: Diagnosis not present

## 2013-06-29 DIAGNOSIS — G608 Other hereditary and idiopathic neuropathies: Secondary | ICD-10-CM | POA: Diagnosis not present

## 2013-06-29 DIAGNOSIS — M999 Biomechanical lesion, unspecified: Secondary | ICD-10-CM | POA: Diagnosis not present

## 2013-06-29 DIAGNOSIS — M25559 Pain in unspecified hip: Secondary | ICD-10-CM | POA: Diagnosis not present

## 2013-07-03 ENCOUNTER — Ambulatory Visit (INDEPENDENT_AMBULATORY_CARE_PROVIDER_SITE_OTHER): Payer: Medicare Other | Admitting: *Deleted

## 2013-07-03 DIAGNOSIS — I428 Other cardiomyopathies: Secondary | ICD-10-CM

## 2013-07-04 LAB — REMOTE ICD DEVICE
BRDY-0002RV: 40 {beats}/min
DEV-0020ICD: NEGATIVE
DEVICE MODEL ICD: 1016528
RV LEAD AMPLITUDE: 12 mv

## 2013-07-05 DIAGNOSIS — M999 Biomechanical lesion, unspecified: Secondary | ICD-10-CM | POA: Diagnosis not present

## 2013-07-05 DIAGNOSIS — IMO0002 Reserved for concepts with insufficient information to code with codable children: Secondary | ICD-10-CM | POA: Diagnosis not present

## 2013-07-05 DIAGNOSIS — M25559 Pain in unspecified hip: Secondary | ICD-10-CM | POA: Diagnosis not present

## 2013-07-05 DIAGNOSIS — G608 Other hereditary and idiopathic neuropathies: Secondary | ICD-10-CM | POA: Diagnosis not present

## 2013-07-05 DIAGNOSIS — M5137 Other intervertebral disc degeneration, lumbosacral region: Secondary | ICD-10-CM | POA: Diagnosis not present

## 2013-07-06 NOTE — Progress Notes (Signed)
Remote defib received  

## 2013-07-10 ENCOUNTER — Encounter: Payer: Self-pay | Admitting: *Deleted

## 2013-07-12 DIAGNOSIS — IMO0002 Reserved for concepts with insufficient information to code with codable children: Secondary | ICD-10-CM | POA: Diagnosis not present

## 2013-07-12 DIAGNOSIS — M999 Biomechanical lesion, unspecified: Secondary | ICD-10-CM | POA: Diagnosis not present

## 2013-07-12 DIAGNOSIS — G608 Other hereditary and idiopathic neuropathies: Secondary | ICD-10-CM | POA: Diagnosis not present

## 2013-07-12 DIAGNOSIS — M5137 Other intervertebral disc degeneration, lumbosacral region: Secondary | ICD-10-CM | POA: Diagnosis not present

## 2013-07-12 DIAGNOSIS — M25559 Pain in unspecified hip: Secondary | ICD-10-CM | POA: Diagnosis not present

## 2013-07-13 ENCOUNTER — Encounter: Payer: Self-pay | Admitting: Internal Medicine

## 2013-07-21 ENCOUNTER — Other Ambulatory Visit (HOSPITAL_BASED_OUTPATIENT_CLINIC_OR_DEPARTMENT_OTHER): Payer: Self-pay | Admitting: Family Medicine

## 2013-07-21 ENCOUNTER — Ambulatory Visit (HOSPITAL_BASED_OUTPATIENT_CLINIC_OR_DEPARTMENT_OTHER)
Admission: RE | Admit: 2013-07-21 | Discharge: 2013-07-21 | Disposition: A | Discharge status: Home / Self Care | Payer: Medicare Other | Source: Ambulatory Visit | Attending: Family Medicine | Admitting: Family Medicine

## 2013-07-21 DIAGNOSIS — R079 Chest pain, unspecified: Secondary | ICD-10-CM | POA: Insufficient documentation

## 2013-07-21 DIAGNOSIS — R0781 Pleurodynia: Secondary | ICD-10-CM

## 2013-07-21 DIAGNOSIS — W19XXXA Unspecified fall, initial encounter: Secondary | ICD-10-CM | POA: Insufficient documentation

## 2013-07-24 DIAGNOSIS — M5137 Other intervertebral disc degeneration, lumbosacral region: Secondary | ICD-10-CM | POA: Diagnosis not present

## 2013-07-24 DIAGNOSIS — M999 Biomechanical lesion, unspecified: Secondary | ICD-10-CM | POA: Diagnosis not present

## 2013-07-24 DIAGNOSIS — IMO0002 Reserved for concepts with insufficient information to code with codable children: Secondary | ICD-10-CM | POA: Diagnosis not present

## 2013-07-24 DIAGNOSIS — M25559 Pain in unspecified hip: Secondary | ICD-10-CM | POA: Diagnosis not present

## 2013-07-24 DIAGNOSIS — G608 Other hereditary and idiopathic neuropathies: Secondary | ICD-10-CM | POA: Diagnosis not present

## 2013-07-24 DIAGNOSIS — M546 Pain in thoracic spine: Secondary | ICD-10-CM | POA: Diagnosis not present

## 2013-08-14 DIAGNOSIS — R262 Difficulty in walking, not elsewhere classified: Secondary | ICD-10-CM | POA: Diagnosis not present

## 2013-08-15 DIAGNOSIS — F0390 Unspecified dementia without behavioral disturbance: Secondary | ICD-10-CM | POA: Diagnosis not present

## 2013-08-15 DIAGNOSIS — D631 Anemia in chronic kidney disease: Secondary | ICD-10-CM | POA: Diagnosis not present

## 2013-08-16 ENCOUNTER — Telehealth: Payer: Self-pay | Admitting: Interventional Cardiology

## 2013-08-16 DIAGNOSIS — I70209 Unspecified atherosclerosis of native arteries of extremities, unspecified extremity: Secondary | ICD-10-CM | POA: Diagnosis not present

## 2013-08-16 DIAGNOSIS — M47817 Spondylosis without myelopathy or radiculopathy, lumbosacral region: Secondary | ICD-10-CM | POA: Diagnosis not present

## 2013-08-16 DIAGNOSIS — M545 Low back pain: Secondary | ICD-10-CM | POA: Diagnosis not present

## 2013-08-16 NOTE — Telephone Encounter (Signed)
I spoke with pt wife. Pt saw Dr. Ethelene Hal today & due to the fact that pt had "blacked" out on 12/4 & 10/29 stated pt needed to be seen by cardiologist. Wife stated Dr. Ethelene Hal thought pt might also need to have his carotid arteries checked as well.  No episodes experienced this week per wife. Reassurance given  Appointment made for pt with Dr. Eldridge Dace Friday 08/18/13  Mylo Red RN

## 2013-08-16 NOTE — Telephone Encounter (Signed)
Please order carotid DOppler. 780.2

## 2013-08-16 NOTE — Telephone Encounter (Signed)
New Message  Pt  Wife called states that her husband was seen by Dr. Ethelene Hal at [facility] who advised the pt to call his cardiologist.  On Dec 4 he fell and blacked out. Informed pt that there is an appointment on 1/21.Marland Kitchen Pt is led to believe that a Carotid is needed. Please call to discuss.

## 2013-08-17 DIAGNOSIS — R262 Difficulty in walking, not elsewhere classified: Secondary | ICD-10-CM | POA: Diagnosis not present

## 2013-08-17 NOTE — Telephone Encounter (Signed)
Pt has appt tomorrow and we will order carotid doppler.

## 2013-08-18 ENCOUNTER — Encounter (INDEPENDENT_AMBULATORY_CARE_PROVIDER_SITE_OTHER): Payer: Self-pay

## 2013-08-18 ENCOUNTER — Encounter: Payer: Self-pay | Admitting: Interventional Cardiology

## 2013-08-18 ENCOUNTER — Ambulatory Visit (HOSPITAL_COMMUNITY): Payer: Medicare Other | Attending: Interventional Cardiology

## 2013-08-18 ENCOUNTER — Ambulatory Visit (INDEPENDENT_AMBULATORY_CARE_PROVIDER_SITE_OTHER): Payer: Medicare Other | Admitting: Interventional Cardiology

## 2013-08-18 VITALS — BP 112/70 | HR 51 | Ht 70.5 in | Wt 190.0 lb

## 2013-08-18 DIAGNOSIS — I2510 Atherosclerotic heart disease of native coronary artery without angina pectoris: Secondary | ICD-10-CM | POA: Diagnosis not present

## 2013-08-18 DIAGNOSIS — Z87891 Personal history of nicotine dependence: Secondary | ICD-10-CM | POA: Insufficient documentation

## 2013-08-18 DIAGNOSIS — I658 Occlusion and stenosis of other precerebral arteries: Secondary | ICD-10-CM | POA: Diagnosis not present

## 2013-08-18 DIAGNOSIS — E119 Type 2 diabetes mellitus without complications: Secondary | ICD-10-CM | POA: Diagnosis not present

## 2013-08-18 DIAGNOSIS — J4489 Other specified chronic obstructive pulmonary disease: Secondary | ICD-10-CM | POA: Insufficient documentation

## 2013-08-18 DIAGNOSIS — I10 Essential (primary) hypertension: Secondary | ICD-10-CM | POA: Diagnosis not present

## 2013-08-18 DIAGNOSIS — E785 Hyperlipidemia, unspecified: Secondary | ICD-10-CM | POA: Insufficient documentation

## 2013-08-18 DIAGNOSIS — I5022 Chronic systolic (congestive) heart failure: Secondary | ICD-10-CM | POA: Diagnosis not present

## 2013-08-18 DIAGNOSIS — R55 Syncope and collapse: Secondary | ICD-10-CM | POA: Insufficient documentation

## 2013-08-18 DIAGNOSIS — Z951 Presence of aortocoronary bypass graft: Secondary | ICD-10-CM | POA: Diagnosis not present

## 2013-08-18 DIAGNOSIS — I509 Heart failure, unspecified: Secondary | ICD-10-CM

## 2013-08-18 DIAGNOSIS — J449 Chronic obstructive pulmonary disease, unspecified: Secondary | ICD-10-CM | POA: Insufficient documentation

## 2013-08-18 DIAGNOSIS — I6529 Occlusion and stenosis of unspecified carotid artery: Secondary | ICD-10-CM | POA: Insufficient documentation

## 2013-08-18 NOTE — Progress Notes (Signed)
Patient ID: Patrick Boyle, male   DOB: 1934/01/18, 79 y.o.   MRN: [id number]    [address] [place], [place]  [address] Phone: [phone] Fax:  [phone]  Date:  08/18/2013   ID:  Patrick Boyle, DOB 1933/12/07, MRN [id number]  PCP:  Patrick Boys, MD      History of Present Illness: Patrick Boyle is a 79 y.o. male who has had an MI and issues with an ischemic cardiomyopathy. He had a defibrillator placed for prevention of sudden death. After that time, he did not feel well. He developed orthostatic hypotension. Blood pressure medicines have been reduced. No recent falls or syncope. Some dizziness with increased Cymbalta for neuropathy. BP will be low as well at that time. He denies any chest pain or resting shortness of breath. Some mild DOE. No leg swelling.  In the past few months, he has had several episodes of syncope.  One episode occurred with walking to the mailbox.  No warning.  No palpitations.  He is sent here for further evaluation.  He has cut himself.  No broken bones.      Wt Readings from Last 3 Encounters:  08/18/13 190 lb (86.183 kg)  04/03/13 190 lb 9.6 oz (86.456 kg)  04/18/12 190 lb 12.8 oz (86.546 kg)     Past Medical History  Diagnosis Date  . Diabetes mellitus     dr Patrick Boyle  . Hyperlipidemia   . Hypertension   . Coronary artery disease     s/p CABG 1997  . GERD (gastroesophageal reflux disease)   . Arthritis   . Allergic rhinitis   . Hyperplastic colon polyp   . Diverticulitis, colon   . Meniere's syndrome   . Tremor, essential     dr Patrick Boyle  . COPD, mild     dr Patrick Boyle  . MI (myocardial infarction) 1997, 2005  . Ischemic cardiomyopathy     sp ICD (SJM) implanted by Dr Patrick Boyle    Current Outpatient Prescriptions  Medication Sig Dispense Refill  . Acetaminophen (ARTHRITIS PAIN RELIEF PO) Take 650 mg by mouth as needed.        Marland Kitchen aspirin 325 MG tablet Take 325 mg by mouth daily.        . bimatoprost (LUMIGAN) 0.03 % ophthalmic  solution Apply 1 drop to eye every evening.        . cetirizine (ZYRTEC) 10 MG tablet Take 5 mg by mouth daily.        Marland Kitchen donepezil (ARICEPT ODT) 10 MG disintegrating tablet Take 10 mg by mouth at bedtime.      . DULoxetine (CYMBALTA) 60 MG capsule 1 tablet once a week      . Ergocalciferol (VITAMIN D2 PO) Take 1 tablet by mouth 2 (two) times daily.      . fish oil-omega-3 fatty acids 1000 MG capsule Take 2 g by mouth 2 (two) times daily.       . Fluticasone Propionate, Inhal, (FLOVENT DISKUS) 100 MCG/BLIST AEPB Inhale 1 puff into the lungs daily.       . furosemide (LASIX) 20 MG tablet Take 20 mg by mouth daily.      Marland Kitchen glimepiride (AMARYL) 4 MG tablet Take 4 mg by mouth 2 (two) times daily.        . lansoprazole (PREVACID) 30 MG capsule Take 30 mg by mouth as needed.        Marland Kitchen levothyroxine (SYNTHROID, LEVOTHROID) 25 MCG tablet Take 25 mcg  by mouth daily.      . meclizine (ANTIVERT) 25 MG tablet Take 25 mg by mouth as needed.        . metoprolol tartrate (LOPRESSOR) 25 MG tablet Take 12.5 mg by mouth 2 (two) times daily.       . simvastatin (ZOCOR) 40 MG tablet Take 40 mg by mouth at bedtime.        . sitaGLIPtan-metformin (JANUMET) 50-500 MG per tablet Take 1 tablet by mouth 2 (two) times daily with a meal.         No current facility-administered medications for this visit.    Allergies:    Allergies  Allergen Reactions  . Metformin And Related     GI sensitivity with high doses  . Ramipril     REACTION: cough    Social History:  The patient  reports that he quit smoking about 17 years ago. His smoking use included Cigarettes. He smoked 0.00 packs per day for 35 years. He does not have any smokeless tobacco history on file. He reports that he drinks alcohol. He reports that he does not use illicit drugs.   Family History:  The patient's family history includes Diabetes in his mother; Parkinsonism in his father; Stomach cancer in his mother.   ROS:  Please see the history of present  illness.  No nausea, vomiting.  No fevers, chills.  No focal weakness.  No dysuria. syncope   All other systems reviewed and negative.   PHYSICAL EXAM: VS:  BP 112/70  Pulse 51  Ht 5' 10.5" (1.791 m)  Wt 190 lb (86.183 kg)  BMI 26.87 kg/m2 Well nourished, well developed, in no acute distress HEENT: normal Neck: no JVD, no carotid bruits Cardiac:  normal S1, S2; RRR; 2/6 systolic murmur Lungs:  clear to auscultation bilaterally, no wheezing, rhonchi or rales Abd: soft, nontender, no hepatomegaly Ext: no edema Skin: warm and dry Neuro:   no focal abnormalities noted  EKG:  Sinus bradycardia; NSST     ASSESSMENT AND PLAN:  Coronary atherosclerosis of native coronary artery  Continue Aspirin 325 mg tablet, 325 mg, 1, orally, Once a day Notes: No angina.    2. Essential hypertension, benign  Notes: Controlled. Continue to hold losartan. Stop Toprol.  Let us know if he develops elevated blood pressure readings. For now, he has had some lower readings. He is having mild orthostatic symptoms. Hopefully, this will improve off of metoprolol.    3. Mixed hyperlipidemia  Continue Simvastatin Tablet, 40 MG, 1 tablet, Orally, Once a day Increase Fish Oil Capsule, 1000 MG, 2 capsules, Orally, Twice a day Notes: TG > 260 in 5/13 and 10/13. A1C is 6.3.  In April 2014, LDL 73, HDL 33    4. Cardiomyopathy  Notes: AICD in place. no CHF symptoms at this time. AICD pacer rate is set at:40.  He was pacing less than 1% of the time.    5. Syncope Notes: Be careful with changing positions, either side to side or up and down. Meclizine for vertigo. Stop metoprolol.  May be related to hypotension.  Look for low BP readings. Carotid Dopplers.  Labs reviewed. Normal TSH. hemoglobin A1c 7.3.    Preventive Medicine  Adult topics discussed:  Diet: healthy diet.  Exercise: 5 days a week, at least 30 minutes of aerobic exercise with low risk of falling; recumbent bike.    Follow Up     Signed, Patrick Mare, MD, [facility] 08/18/2013 1:52 PM

## 2013-08-18 NOTE — Patient Instructions (Signed)
Your physician has recommended you make the following change in your medication:   1. Stop Metoprolol.  Your physician has requested that you have a carotid duplex. This test is an ultrasound of the carotid arteries in your neck. It looks at blood flow through these arteries that supply the brain with blood. Allow one hour for this exam. There are no restrictions or special instructions.  Your physician recommends that you follow up as scheduled.

## 2013-08-21 ENCOUNTER — Other Ambulatory Visit: Payer: Self-pay | Admitting: Interventional Cardiology

## 2013-08-21 ENCOUNTER — Encounter (HOSPITAL_COMMUNITY): Payer: Medicare Other

## 2013-08-21 DIAGNOSIS — R262 Difficulty in walking, not elsewhere classified: Secondary | ICD-10-CM | POA: Diagnosis not present

## 2013-08-21 NOTE — Telephone Encounter (Signed)
New Message  Pt's wife called with two BP readings  162/79 at 8:30 am 141/69 at 4:30 pm

## 2013-08-21 NOTE — Telephone Encounter (Signed)
New Problem:  Pt's wife states her husband was told he could come off of his metoprolol and he did not take it on Friday or Saturday. However on Sunday his BP was 178/74. Pt has been back on metoprolol since Sunday... Pt's wife is asking is it ok if he goes back on his medicine or what should he do. Pt's wife would like a call back.

## 2013-08-21 NOTE — Telephone Encounter (Signed)
Pt took his BP again and it was 139/69 with HR of 56 bpm. Pt will not take another 1/2 tablet if metoprolol this afternoon.

## 2013-08-21 NOTE — Telephone Encounter (Signed)
Spoke with pts wife and pt went back on metoprolol yesterday and she is not sure if he has taken metoprolol today. Pt had a HA with BP reading of 178 systolic. She is not sure what BP is today. She will call me back and let me know how is BP is doing once he gets home.

## 2013-08-21 NOTE — Telephone Encounter (Signed)
Spoke with pts wife and pt took metoprolol 25 mg 1/2 this am and Hr is now 68bpm. Pt is feeling okay today and denies dizziness. Pt wants to know if he should stay on metoprolol or go back off medication?

## 2013-08-22 DIAGNOSIS — R262 Difficulty in walking, not elsewhere classified: Secondary | ICD-10-CM | POA: Diagnosis not present

## 2013-08-22 NOTE — Telephone Encounter (Signed)
Per Dr. Excell Seltzer DOD pt can go back on lopressor 25 mg 1/2 tab po qd for now and continue to monitor symptoms.

## 2013-08-22 NOTE — Telephone Encounter (Signed)
Pt's wife aware.

## 2013-08-23 DIAGNOSIS — R262 Difficulty in walking, not elsewhere classified: Secondary | ICD-10-CM | POA: Diagnosis not present

## 2013-08-28 DIAGNOSIS — R262 Difficulty in walking, not elsewhere classified: Secondary | ICD-10-CM | POA: Diagnosis not present

## 2013-08-30 DIAGNOSIS — R262 Difficulty in walking, not elsewhere classified: Secondary | ICD-10-CM | POA: Diagnosis not present

## 2013-09-01 DIAGNOSIS — R262 Difficulty in walking, not elsewhere classified: Secondary | ICD-10-CM | POA: Diagnosis not present

## 2013-09-04 ENCOUNTER — Telehealth: Payer: Self-pay | Admitting: Cardiology

## 2013-09-04 DIAGNOSIS — R262 Difficulty in walking, not elsewhere classified: Secondary | ICD-10-CM | POA: Diagnosis not present

## 2013-09-04 NOTE — Telephone Encounter (Signed)
Patrick Boyle at 09/04/2013 8:50 AM     Status: Signed        Follow up  bp is now 166/87. Want Patrick Boyle to know.

## 2013-09-04 NOTE — Telephone Encounter (Signed)
Continue to monitor.Let us know if there are more high readings.

## 2013-09-04 NOTE — Telephone Encounter (Signed)
Follow up     bp is now 166/87.  Want Amy to know.

## 2013-09-04 NOTE — Telephone Encounter (Signed)
Pt is taking lopressor 25 mg 1/2 tab qd.

## 2013-09-05 DIAGNOSIS — G3184 Mild cognitive impairment, so stated: Secondary | ICD-10-CM | POA: Diagnosis not present

## 2013-09-05 DIAGNOSIS — I10 Essential (primary) hypertension: Secondary | ICD-10-CM | POA: Diagnosis not present

## 2013-09-05 DIAGNOSIS — E1149 Type 2 diabetes mellitus with other diabetic neurological complication: Secondary | ICD-10-CM | POA: Diagnosis not present

## 2013-09-05 DIAGNOSIS — E1142 Type 2 diabetes mellitus with diabetic polyneuropathy: Secondary | ICD-10-CM | POA: Diagnosis not present

## 2013-09-06 DIAGNOSIS — R262 Difficulty in walking, not elsewhere classified: Secondary | ICD-10-CM | POA: Diagnosis not present

## 2013-09-08 DIAGNOSIS — R262 Difficulty in walking, not elsewhere classified: Secondary | ICD-10-CM | POA: Diagnosis not present

## 2013-09-11 DIAGNOSIS — R262 Difficulty in walking, not elsewhere classified: Secondary | ICD-10-CM | POA: Diagnosis not present

## 2013-09-13 DIAGNOSIS — R262 Difficulty in walking, not elsewhere classified: Secondary | ICD-10-CM | POA: Diagnosis not present

## 2013-09-15 DIAGNOSIS — R262 Difficulty in walking, not elsewhere classified: Secondary | ICD-10-CM | POA: Diagnosis not present

## 2013-09-18 DIAGNOSIS — R262 Difficulty in walking, not elsewhere classified: Secondary | ICD-10-CM | POA: Diagnosis not present

## 2013-09-20 ENCOUNTER — Encounter: Payer: Self-pay | Admitting: Internal Medicine

## 2013-09-20 ENCOUNTER — Ambulatory Visit (INDEPENDENT_AMBULATORY_CARE_PROVIDER_SITE_OTHER): Payer: Medicare Other | Admitting: Internal Medicine

## 2013-09-20 ENCOUNTER — Encounter (INDEPENDENT_AMBULATORY_CARE_PROVIDER_SITE_OTHER): Payer: Self-pay

## 2013-09-20 VITALS — BP 130/74 | HR 55 | Ht 71.0 in | Wt 192.0 lb

## 2013-09-20 DIAGNOSIS — Z95810 Presence of automatic (implantable) cardiac defibrillator: Secondary | ICD-10-CM

## 2013-09-20 DIAGNOSIS — I259 Chronic ischemic heart disease, unspecified: Secondary | ICD-10-CM | POA: Diagnosis not present

## 2013-09-20 DIAGNOSIS — I509 Heart failure, unspecified: Secondary | ICD-10-CM

## 2013-09-20 DIAGNOSIS — I5022 Chronic systolic (congestive) heart failure: Secondary | ICD-10-CM | POA: Diagnosis not present

## 2013-09-20 LAB — MDC_IDC_ENUM_SESS_TYPE_INCLINIC
Date Time Interrogation Session: 20150121122555
HIGH POWER IMPEDANCE MEASURED VALUE: 63 Ohm
Implantable Pulse Generator Serial Number: 1016528
Lead Channel Impedance Value: 425 Ohm
Lead Channel Pacing Threshold Amplitude: 1 V
Lead Channel Pacing Threshold Amplitude: 1 V
Lead Channel Pacing Threshold Pulse Width: 0.5 ms
Lead Channel Sensing Intrinsic Amplitude: 12 mV
Lead Channel Setting Pacing Pulse Width: 0.5 ms
Lead Channel Setting Sensing Sensitivity: 0.5 mV
MDC IDC MSMT BATTERY REMAINING LONGEVITY: 92.4 mo
MDC IDC MSMT LEADCHNL RV PACING THRESHOLD PULSEWIDTH: 0.5 ms
MDC IDC SET LEADCHNL RV PACING AMPLITUDE: 2.5 V
MDC IDC SET ZONE DETECTION INTERVAL: 300 ms
MDC IDC STAT BRADY RV PERCENT PACED: 1.8 %
Zone Setting Detection Interval: 350 ms

## 2013-09-20 NOTE — Patient Instructions (Signed)
Your physician wants you to follow-up in: 6 months device clinic and 12 months with Dr Rayann Heman Dennis Bast will receive a reminder letter in the mail two months in advance. If you don't receive a letter, please call our office to schedule the follow-up appointment.   Remote monitoring is used to monitor your Pacemaker or ICD from home. This monitoring reduces the number of office visits required to check your device to one time per year. It allows [place] to keep an eye on the functioning of your device to ensure it is working properly. You are scheduled for a device check from home on 12/22/13. You may send your transmission at any time that day. If you have a wireless device, the transmission will be sent automatically. After your physician reviews your transmission, you will receive a postcard with your next transmission date.

## 2013-09-20 NOTE — Progress Notes (Signed)
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 male who presents today for routine electrophysiology followup.  Since last being seen in our clinic, the patient reports doing reasonably well.  His orthostasis is improved. Dr Irish Lack had stopped his metoprolol however the patient restarted it due to elevations in BP.  Today, he denies symptoms of palpitations, chest pain, shortness of breath,  lower extremity edema, presyncope, syncope, or ICD shocks.  The patient is otherwise without complaint today.   Past Medical History  Diagnosis Date  . Diabetes mellitus     dr Olen Pel  . Hyperlipidemia   . Hypertension   . Coronary artery disease     s/p CABG 1997  . GERD (gastroesophageal reflux disease)   . Arthritis   . Allergic rhinitis   . Hyperplastic colon polyp   . Diverticulitis, colon   . Meniere's syndrome   . Tremor, essential     dr Jannifer Franklin  . COPD, mild     dr clance  . MI (myocardial infarction) 1997, 2005  . Ischemic cardiomyopathy     sp ICD (SJM) implanted by Dr Rayann Heman   Past Surgical History  Procedure Laterality Date  . Coronary artery bypass graft  [facility]  . Cardiac catheterization  2005    medical therapy  . Cardiac defibrillator placement      ICD implanted by Dr Rayann Heman, Analyze ST study patient    Current Outpatient Prescriptions  Medication Sig Dispense Refill  . Acetaminophen (ARTHRITIS PAIN RELIEF PO) Take 650 mg by mouth as needed.        Marland Kitchen aspirin 325 MG tablet Take 325 mg by mouth daily.        . bimatoprost (LUMIGAN) 0.03 % ophthalmic solution Apply 1 drop to eye every evening.        . cetirizine (ZYRTEC) 10 MG tablet Take 5 mg by mouth daily.        Marland Kitchen donepezil (ARICEPT ODT) 10 MG disintegrating tablet Take 10 mg by mouth at bedtime.      . fish oil-omega-3 fatty acids 1000 MG capsule Take 2 g by mouth 2 (two) times daily.       . Fluticasone Propionate, Inhal, (FLOVENT DISKUS) 100 MCG/BLIST AEPB Inhale  1 puff into the lungs daily.       . furosemide (LASIX) 20 MG tablet Take 20 mg by mouth every other day.       Marland Kitchen glimepiride (AMARYL) 4 MG tablet Take 4 mg by mouth 2 (two) times daily.        . lansoprazole (PREVACID) 30 MG capsule Take 30 mg by mouth as needed.        Marland Kitchen levothyroxine (SYNTHROID, LEVOTHROID) 50 MCG tablet Take 50 mcg by mouth daily before breakfast.      . meclizine (ANTIVERT) 25 MG tablet Take 5 mg by mouth as needed.       . simvastatin (ZOCOR) 40 MG tablet Take 40 mg by mouth at bedtime.        . sitaGLIPtan-metformin (JANUMET) 50-500 MG per tablet Take 1 tablet by mouth 2 (two) times daily with a meal.         No current facility-administered medications for this visit.    Physical Exam: Filed Vitals:   09/20/13 1107  BP: 130/74  Pulse: 55  Height: 5\' 11"  (1.803 m)  Weight: 192 lb (87.091 kg)    GEN- The patient is well  appearing, alert and oriented x 3 today.   Head- normocephalic, atraumatic Eyes-  Sclera clear, conjunctiva pink Ears- hearing intact Oropharynx- clear Lungs- Clear to ausculation bilaterally, normal work of breathing Chest- ICD pocket is well healed Heart- Regular rate and rhythm, no murmurs, rubs or gallops, PMI not laterally displaced GI- soft, NT, ND, + BS Extremities- no clubbing, cyanosis, or edema  ICD interrogation- reviewed in detail today,  See PACEART report  Assessment and Plan:  1.  Chronic systolic dysfunction euvolemic today Stable on an appropriate medical regimen Normal ICD function See Pace Art report No changes today  2. Postural hypotension  Improved No changes today  Merlin I will see in a year

## 2013-09-21 DIAGNOSIS — G609 Hereditary and idiopathic neuropathy, unspecified: Secondary | ICD-10-CM | POA: Diagnosis not present

## 2013-09-21 DIAGNOSIS — M545 Low back pain, unspecified: Secondary | ICD-10-CM | POA: Diagnosis not present

## 2013-09-22 DIAGNOSIS — R262 Difficulty in walking, not elsewhere classified: Secondary | ICD-10-CM | POA: Diagnosis not present

## 2013-09-25 DIAGNOSIS — R262 Difficulty in walking, not elsewhere classified: Secondary | ICD-10-CM | POA: Diagnosis not present

## 2013-09-26 DIAGNOSIS — L821 Other seborrheic keratosis: Secondary | ICD-10-CM | POA: Diagnosis not present

## 2013-09-26 DIAGNOSIS — L570 Actinic keratosis: Secondary | ICD-10-CM | POA: Diagnosis not present

## 2013-09-26 DIAGNOSIS — C44211 Basal cell carcinoma of skin of unspecified ear and external auricular canal: Secondary | ICD-10-CM | POA: Diagnosis not present

## 2013-09-27 DIAGNOSIS — S239XXA Sprain of unspecified parts of thorax, initial encounter: Secondary | ICD-10-CM | POA: Diagnosis not present

## 2013-09-29 DIAGNOSIS — R262 Difficulty in walking, not elsewhere classified: Secondary | ICD-10-CM | POA: Diagnosis not present

## 2013-10-02 DIAGNOSIS — R262 Difficulty in walking, not elsewhere classified: Secondary | ICD-10-CM | POA: Diagnosis not present

## 2013-10-04 DIAGNOSIS — S239XXA Sprain of unspecified parts of thorax, initial encounter: Secondary | ICD-10-CM | POA: Diagnosis not present

## 2013-10-06 DIAGNOSIS — R262 Difficulty in walking, not elsewhere classified: Secondary | ICD-10-CM | POA: Diagnosis not present

## 2013-10-09 DIAGNOSIS — R262 Difficulty in walking, not elsewhere classified: Secondary | ICD-10-CM | POA: Diagnosis not present

## 2013-10-11 DIAGNOSIS — J329 Chronic sinusitis, unspecified: Secondary | ICD-10-CM | POA: Diagnosis not present

## 2013-10-25 DIAGNOSIS — R262 Difficulty in walking, not elsewhere classified: Secondary | ICD-10-CM | POA: Diagnosis not present

## 2013-11-02 DIAGNOSIS — H4011X0 Primary open-angle glaucoma, stage unspecified: Secondary | ICD-10-CM | POA: Diagnosis not present

## 2013-11-02 DIAGNOSIS — H409 Unspecified glaucoma: Secondary | ICD-10-CM | POA: Diagnosis not present

## 2013-11-02 DIAGNOSIS — E119 Type 2 diabetes mellitus without complications: Secondary | ICD-10-CM | POA: Diagnosis not present

## 2013-11-17 DIAGNOSIS — R197 Diarrhea, unspecified: Secondary | ICD-10-CM | POA: Diagnosis not present

## 2013-11-20 DIAGNOSIS — R197 Diarrhea, unspecified: Secondary | ICD-10-CM | POA: Diagnosis not present

## 2013-12-05 DIAGNOSIS — E1149 Type 2 diabetes mellitus with other diabetic neurological complication: Secondary | ICD-10-CM | POA: Diagnosis not present

## 2013-12-05 DIAGNOSIS — G3184 Mild cognitive impairment, so stated: Secondary | ICD-10-CM | POA: Diagnosis not present

## 2013-12-05 DIAGNOSIS — E1142 Type 2 diabetes mellitus with diabetic polyneuropathy: Secondary | ICD-10-CM | POA: Diagnosis not present

## 2013-12-13 DIAGNOSIS — D631 Anemia in chronic kidney disease: Secondary | ICD-10-CM | POA: Diagnosis not present

## 2013-12-13 DIAGNOSIS — N2581 Secondary hyperparathyroidism of renal origin: Secondary | ICD-10-CM | POA: Diagnosis not present

## 2013-12-13 DIAGNOSIS — N039 Chronic nephritic syndrome with unspecified morphologic changes: Secondary | ICD-10-CM | POA: Diagnosis not present

## 2013-12-13 DIAGNOSIS — N183 Chronic kidney disease, stage 3 unspecified: Secondary | ICD-10-CM | POA: Diagnosis not present

## 2013-12-13 DIAGNOSIS — E119 Type 2 diabetes mellitus without complications: Secondary | ICD-10-CM | POA: Diagnosis not present

## 2013-12-22 ENCOUNTER — Ambulatory Visit (INDEPENDENT_AMBULATORY_CARE_PROVIDER_SITE_OTHER): Payer: Medicare Other | Admitting: *Deleted

## 2013-12-22 ENCOUNTER — Encounter: Payer: Self-pay | Admitting: Internal Medicine

## 2013-12-22 DIAGNOSIS — I259 Chronic ischemic heart disease, unspecified: Secondary | ICD-10-CM

## 2013-12-23 LAB — MDC_IDC_ENUM_SESS_TYPE_REMOTE
Brady Statistic RV Percent Paced: 1 %
HighPow Impedance: 68 Ohm
Implantable Pulse Generator Serial Number: 1016528
Lead Channel Impedance Value: 410 Ohm
Lead Channel Pacing Threshold Amplitude: 1 V
Lead Channel Pacing Threshold Pulse Width: 0.5 ms
Lead Channel Sensing Intrinsic Amplitude: 12 mV
Lead Channel Setting Pacing Pulse Width: 0.5 ms
Lead Channel Setting Sensing Sensitivity: 0.5 mV
MDC IDC MSMT BATTERY REMAINING LONGEVITY: 88 mo
MDC IDC SET LEADCHNL RV PACING AMPLITUDE: 2.5 V
MDC IDC SET ZONE DETECTION INTERVAL: 300 ms
Zone Setting Detection Interval: 350 ms

## 2013-12-28 ENCOUNTER — Encounter: Payer: Self-pay | Admitting: Interventional Cardiology

## 2014-01-02 NOTE — Progress Notes (Signed)
ICD remote received. Sensing, impedances, auto capture thresholds consistent with previous device measurements. Histograms appropriate for patient and level of activity.  No ventricular arrhythmias recorded. All other diagnostic data reviewed and is appropriate and stable for patient. Real time EGM demonstrates appropriate sensing and capture. Estimated longevity 7.4 years.  Plan to check remotely in 3 months, see in office annually.  Next remote 03/26/14.

## 2014-01-16 DIAGNOSIS — M546 Pain in thoracic spine: Secondary | ICD-10-CM | POA: Diagnosis not present

## 2014-01-16 DIAGNOSIS — E1142 Type 2 diabetes mellitus with diabetic polyneuropathy: Secondary | ICD-10-CM | POA: Diagnosis not present

## 2014-01-18 ENCOUNTER — Encounter: Payer: Self-pay | Admitting: Cardiology

## 2014-01-25 DIAGNOSIS — Z85828 Personal history of other malignant neoplasm of skin: Secondary | ICD-10-CM | POA: Diagnosis not present

## 2014-01-25 DIAGNOSIS — L570 Actinic keratosis: Secondary | ICD-10-CM | POA: Diagnosis not present

## 2014-02-08 ENCOUNTER — Ambulatory Visit: Payer: Medicare Other | Admitting: Interventional Cardiology

## 2014-02-22 ENCOUNTER — Ambulatory Visit (INDEPENDENT_AMBULATORY_CARE_PROVIDER_SITE_OTHER): Payer: Medicare Other | Admitting: Interventional Cardiology

## 2014-02-22 ENCOUNTER — Encounter: Payer: Self-pay | Admitting: Interventional Cardiology

## 2014-02-22 VITALS — BP 132/80 | HR 64 | Ht 71.0 in | Wt 187.0 lb

## 2014-02-22 DIAGNOSIS — I2510 Atherosclerotic heart disease of native coronary artery without angina pectoris: Secondary | ICD-10-CM

## 2014-02-22 DIAGNOSIS — E785 Hyperlipidemia, unspecified: Secondary | ICD-10-CM | POA: Diagnosis not present

## 2014-02-22 DIAGNOSIS — I10 Essential (primary) hypertension: Secondary | ICD-10-CM

## 2014-02-22 DIAGNOSIS — I5022 Chronic systolic (congestive) heart failure: Secondary | ICD-10-CM

## 2014-02-22 DIAGNOSIS — Z95810 Presence of automatic (implantable) cardiac defibrillator: Secondary | ICD-10-CM | POA: Diagnosis not present

## 2014-02-22 DIAGNOSIS — I509 Heart failure, unspecified: Secondary | ICD-10-CM

## 2014-02-22 NOTE — Patient Instructions (Signed)
Your physician recommends that you continue on your current medications as directed. Please refer to the Current Medication list given to you today.  Your physician wants you to follow-up in: 1 year with Dr. Varanasi. You will receive a reminder letter in the mail two months in advance. If you don't receive a letter, please call our office to schedule the follow-up appointment.  

## 2014-02-22 NOTE — Progress Notes (Signed)
Patient ID: Patrick Boyle, male   DOB: December 18, 1933, 80 y.o.   MRN: [id number]    [address], [place], [place]  [address] Phone: [phone] Fax:  [phone]  Date:  02/22/2014   ID:  Patrick Boyle, DOB 01-08-1934, MRN [id number]  PCP:  Abigail Miyamoto, MD      History of Present Illness: Patrick Boyle is a 80 y.o. male who has had an MI and issues with an ischemic cardiomyopathy. He had a defibrillator placed for prevention of sudden death. After that time, he did not feel well. He developed orthostatic hypotension. Blood pressure medicines have been reduced. No recent falls or syncope. Some dizziness with increased Cymbalta for neuropathy. BP will be low as well at that time. He denies any chest pain or resting shortness of breath. Some mild DOE. No leg swelling.  Last year, he has had several episodes of syncope.  One episode occurred with walking to the mailbox.  No warning.  No palpitations.  He is sent here for further evaluation.  He has cut himself.  No broken bones.    Feels well. Has not had any syncopal episodes or falls since last visit. Has some dizziness but thinks it is due to the amitriptyline. Denies chest pain, shortness of breath, orthopnea, PND, leg edema. No bleeding problems. Doesn't check his BP at home.  He walks very little, he walks to the mailbox and walks with his dog w/o shortness of breath or chest pain. Feet limit his walking.  Wt Readings from Last 3 Encounters:  02/22/14 187 lb (84.823 kg)  09/20/13 192 lb (87.091 kg)  08/18/13 190 lb (86.183 kg)     Past Medical History  Diagnosis Date  . Diabetes mellitus     dr Olen Pel  . Hyperlipidemia   . Hypertension   . Coronary artery disease     s/p CABG 1997  . GERD (gastroesophageal reflux disease)   . Arthritis   . Allergic rhinitis   . Hyperplastic colon polyp   . Diverticulitis, colon   . Meniere's syndrome   . Tremor, essential     dr Jannifer Franklin  . COPD, mild     dr clance  . MI  (myocardial infarction) 1997, 2005  . Ischemic cardiomyopathy     sp ICD (SJM) implanted by Dr Rayann Heman    Current Outpatient Prescriptions  Medication Sig Dispense Refill  . Acetaminophen (ARTHRITIS PAIN RELIEF PO) Take 650 mg by mouth as needed.        Marland Kitchen amitriptyline (ELAVIL) 25 MG tablet Take 25 mg by mouth at bedtime.      Marland Kitchen aspirin 325 MG tablet Take 325 mg by mouth daily.        . bimatoprost (LUMIGAN) 0.03 % ophthalmic solution Apply 1 drop to eye every evening.        . calcitRIOL (ROCALTROL) 0.25 MCG capsule Take 0.25 mcg by mouth daily.      . cetirizine (ZYRTEC) 10 MG tablet Take 5 mg by mouth daily.        Marland Kitchen donepezil (ARICEPT ODT) 10 MG disintegrating tablet Take 10 mg by mouth at bedtime.      . fish oil-omega-3 fatty acids 1000 MG capsule Take 2 g by mouth 2 (two) times daily.       . Fluticasone Propionate, Inhal, (FLOVENT DISKUS) 100 MCG/BLIST AEPB Inhale 1 puff into the lungs daily.       . furosemide (LASIX) 20 MG tablet Take 20  mg by mouth every other day.       Marland Kitchen glimepiride (AMARYL) 4 MG tablet Take 4 mg by mouth 2 (two) times daily.        . lansoprazole (PREVACID) 30 MG capsule Take 30 mg by mouth as needed.        Marland Kitchen levothyroxine (SYNTHROID, LEVOTHROID) 50 MCG tablet Take 50 mcg by mouth daily before breakfast.      . meclizine (ANTIVERT) 25 MG tablet Take 5 mg by mouth as needed.       . metoprolol succinate (TOPROL-XL) 25 MG 24 hr tablet Take 25 mg by mouth daily.      . simvastatin (ZOCOR) 40 MG tablet Take 40 mg by mouth at bedtime.        . sitaGLIPtan-metformin (JANUMET) 50-500 MG per tablet Take 1 tablet by mouth 2 (two) times daily with a meal.         No current facility-administered medications for this visit.    Allergies:    Allergies  Allergen Reactions  . Metformin And Related     GI sensitivity with high doses  . Ramipril     REACTION: cough    Social History:  The patient  reports that he quit smoking about 18 years ago. His smoking use  included Cigarettes. He smoked 0.00 packs per day for 35 years. He does not have any smokeless tobacco history on file. He reports that he drinks alcohol. He reports that he does not use illicit drugs.   Family History:  The patient's family history includes Diabetes in his mother; Parkinsonism in his father; Stomach cancer in his mother.   ROS:  Please see the history of present illness.  No nausea, vomiting.  No fevers, chills.  No focal weakness.  No dysuria. syncope   All other systems reviewed and negative.   PHYSICAL EXAM: VS:  BP 132/80  Pulse 64  Ht 5\' 11"  (1.803 m)  Wt 187 lb (84.823 kg)  BMI 26.09 kg/m2 Well nourished, well developed, in no acute distress HEENT: normal Neck: no JVD, no carotid bruits Cardiac:  normal S1, S2; RRR; 2/6 systolic murmur Lungs:  clear to auscultation bilaterally, no wheezing, rhonchi or rales Abd: soft, nontender, no hepatomegaly Ext: no edema Skin: warm and dry Neuro:   no focal abnormalities noted  EKG:  Sinus bradycardia; NSST     ASSESSMENT AND PLAN:  Coronary atherosclerosis of native coronary artery  Continue Aspirin 325 mg tablet, 325 mg, 1, orally, Once a day Notes: No angina. H/o MI.   2. Essential hypertension, benign  Notes: Controlled. Continue to hold losartan. Stopped Toprol.  Let us know if he develops elevated blood pressure readings. For now, he has had some lower readings. Mild orthostatic symptoms. Overall, improved off of metoprolol.    3. Mixed hyperlipidemia  Continue Simvastatin Tablet, 40 MG, 1 tablet, Orally, Once a day Increase Fish Oil Capsule, 1000 MG, 2 capsules, Orally, Twice a day Notes: TG > 260 in 5/13 and 10/13. A1C is 6.3.  In April 2014, LDL 73, HDL 33    4. Cardiomyopathy - ischemic Notes: AICD in place. no CHF symptoms at this time. AICD pacer rate is set at:40.  He was pacing less than 1% of the time.    5. Syncope- none recently Notes: Be careful with changing positions, either side to side or up  and down. Meclizine for vertigo. Stop metoprolol.  May be related to hypotension.  Look for low BP readings.  Carotid Dopplers.  Labs reviewed. Normal TSH. hemoglobin A1c 7.3.    Preventive Medicine  Adult topics discussed:  Diet: healthy diet.  Exercise: 5 days a week, at least 30 minutes of aerobic exercise with low risk of falling; recumbent bike.    Follow Up  1 year     Signed, Mina Marble, MD, [facility] 02/22/2014 10:30 AM

## 2014-03-29 ENCOUNTER — Other Ambulatory Visit: Payer: Self-pay | Admitting: *Deleted

## 2014-03-29 ENCOUNTER — Ambulatory Visit (INDEPENDENT_AMBULATORY_CARE_PROVIDER_SITE_OTHER): Payer: Medicare Other | Admitting: *Deleted

## 2014-03-29 DIAGNOSIS — I259 Chronic ischemic heart disease, unspecified: Secondary | ICD-10-CM | POA: Diagnosis not present

## 2014-03-29 LAB — MDC_IDC_ENUM_SESS_TYPE_INCLINIC
Battery Remaining Longevity: 86.4 mo
HIGH POWER IMPEDANCE MEASURED VALUE: 64.125
HighPow Impedance: 64 Ohm
Implantable Pulse Generator Serial Number: 1016528
Lead Channel Impedance Value: 450 Ohm
Lead Channel Pacing Threshold Amplitude: 1 V
Lead Channel Pacing Threshold Amplitude: 1 V
Lead Channel Pacing Threshold Pulse Width: 0.5 ms
Lead Channel Pacing Threshold Pulse Width: 0.5 ms
Lead Channel Sensing Intrinsic Amplitude: 12 mV
Lead Channel Setting Pacing Amplitude: 2.5 V
Lead Channel Setting Pacing Pulse Width: 0.5 ms
Lead Channel Setting Sensing Sensitivity: 0.5 mV
MDC IDC SESS DTM: 20150730131646
MDC IDC SET ZONE DETECTION INTERVAL: 300 ms
MDC IDC SET ZONE DETECTION INTERVAL: 350 ms
MDC IDC STAT BRADY RV PERCENT PACED: 0.58 %

## 2014-03-29 NOTE — Progress Notes (Signed)
ICD check in clinic. Normal device function. Thresholds and sensing consistent with previous device measurements. Impedance trends stable over time. No evidence of any ventricular arrhythmias.  Histogram distribution appropriate for patient and level of activity. No changes made this session. Device programmed at appropriate safety margins. Device programmed to optimize intrinsic conduction. Estimated longevity 7.2 years. Pt enrolled in remote follow-up. Plan to check device every 3 months remotely and in office annually. Patient education completed including shock plan. Alert tones/vibration demonstrated for patient.  Merlin 07/02/14.  Checked by Armed forces training and education officer for research.

## 2014-04-16 ENCOUNTER — Encounter: Payer: Self-pay | Admitting: Internal Medicine

## 2014-04-18 DIAGNOSIS — D631 Anemia in chronic kidney disease: Secondary | ICD-10-CM | POA: Diagnosis not present

## 2014-04-18 DIAGNOSIS — N183 Chronic kidney disease, stage 3 unspecified: Secondary | ICD-10-CM | POA: Diagnosis not present

## 2014-04-18 DIAGNOSIS — N2581 Secondary hyperparathyroidism of renal origin: Secondary | ICD-10-CM | POA: Diagnosis not present

## 2014-04-18 DIAGNOSIS — E119 Type 2 diabetes mellitus without complications: Secondary | ICD-10-CM | POA: Diagnosis not present

## 2014-04-25 DIAGNOSIS — H4011X0 Primary open-angle glaucoma, stage unspecified: Secondary | ICD-10-CM | POA: Diagnosis not present

## 2014-04-25 DIAGNOSIS — H409 Unspecified glaucoma: Secondary | ICD-10-CM | POA: Diagnosis not present

## 2014-04-25 DIAGNOSIS — E119 Type 2 diabetes mellitus without complications: Secondary | ICD-10-CM | POA: Diagnosis not present

## 2014-05-10 DIAGNOSIS — N183 Chronic kidney disease, stage 3 unspecified: Secondary | ICD-10-CM | POA: Diagnosis not present

## 2014-05-14 ENCOUNTER — Other Ambulatory Visit: Payer: Self-pay | Admitting: Nephrology

## 2014-05-14 DIAGNOSIS — N183 Chronic kidney disease, stage 3 unspecified: Secondary | ICD-10-CM

## 2014-05-15 ENCOUNTER — Ambulatory Visit
Admission: RE | Admit: 2014-05-15 | Discharge: 2014-05-15 | Disposition: A | Discharge status: Home / Self Care | Payer: Medicare Other | Source: Ambulatory Visit | Attending: Nephrology | Admitting: Nephrology

## 2014-05-15 DIAGNOSIS — N183 Chronic kidney disease, stage 3 unspecified: Secondary | ICD-10-CM

## 2014-05-15 DIAGNOSIS — N3289 Other specified disorders of bladder: Secondary | ICD-10-CM | POA: Diagnosis not present

## 2014-05-15 DIAGNOSIS — N189 Chronic kidney disease, unspecified: Secondary | ICD-10-CM | POA: Diagnosis not present

## 2014-05-17 DIAGNOSIS — H26499 Other secondary cataract, unspecified eye: Secondary | ICD-10-CM | POA: Diagnosis not present

## 2014-05-22 DIAGNOSIS — R609 Edema, unspecified: Secondary | ICD-10-CM | POA: Diagnosis not present

## 2014-05-22 DIAGNOSIS — E1142 Type 2 diabetes mellitus with diabetic polyneuropathy: Secondary | ICD-10-CM | POA: Diagnosis not present

## 2014-05-22 DIAGNOSIS — N183 Chronic kidney disease, stage 3 unspecified: Secondary | ICD-10-CM | POA: Diagnosis not present

## 2014-05-22 DIAGNOSIS — E039 Hypothyroidism, unspecified: Secondary | ICD-10-CM | POA: Diagnosis not present

## 2014-05-22 DIAGNOSIS — E1149 Type 2 diabetes mellitus with other diabetic neurological complication: Secondary | ICD-10-CM | POA: Diagnosis not present

## 2014-05-22 DIAGNOSIS — E782 Mixed hyperlipidemia: Secondary | ICD-10-CM | POA: Diagnosis not present

## 2014-05-22 DIAGNOSIS — E1129 Type 2 diabetes mellitus with other diabetic kidney complication: Secondary | ICD-10-CM | POA: Diagnosis not present

## 2014-06-11 DIAGNOSIS — N183 Chronic kidney disease, stage 3 (moderate): Secondary | ICD-10-CM | POA: Diagnosis not present

## 2014-06-13 DIAGNOSIS — H26493 Other secondary cataract, bilateral: Secondary | ICD-10-CM | POA: Diagnosis not present

## 2014-06-18 DIAGNOSIS — R350 Frequency of micturition: Secondary | ICD-10-CM | POA: Diagnosis not present

## 2014-06-18 DIAGNOSIS — N401 Enlarged prostate with lower urinary tract symptoms: Secondary | ICD-10-CM | POA: Diagnosis not present

## 2014-06-18 DIAGNOSIS — N281 Cyst of kidney, acquired: Secondary | ICD-10-CM | POA: Diagnosis not present

## 2014-07-02 ENCOUNTER — Encounter: Payer: Self-pay | Admitting: Internal Medicine

## 2014-07-02 ENCOUNTER — Other Ambulatory Visit: Payer: Self-pay | Admitting: Internal Medicine

## 2014-07-02 ENCOUNTER — Ambulatory Visit (INDEPENDENT_AMBULATORY_CARE_PROVIDER_SITE_OTHER): Payer: Medicare Other | Admitting: *Deleted

## 2014-07-02 DIAGNOSIS — I259 Chronic ischemic heart disease, unspecified: Secondary | ICD-10-CM

## 2014-07-02 LAB — MDC_IDC_ENUM_SESS_TYPE_INCLINIC
Brady Statistic RV Percent Paced: 1 %
HighPow Impedance: 62 Ohm
Implantable Pulse Generator Serial Number: 1016528
Lead Channel Impedance Value: 440 Ohm
Lead Channel Sensing Intrinsic Amplitude: 12 mV
Lead Channel Setting Sensing Sensitivity: 0.5 mV
MDC IDC SET LEADCHNL RV PACING AMPLITUDE: 2.5 V
MDC IDC SET LEADCHNL RV PACING PULSEWIDTH: 0.5 ms
MDC IDC SET ZONE DETECTION INTERVAL: 300 ms
Zone Setting Detection Interval: 350 ms

## 2014-07-02 NOTE — Progress Notes (Signed)
Remote ICD transmission.   

## 2014-07-06 DIAGNOSIS — Z23 Encounter for immunization: Secondary | ICD-10-CM | POA: Diagnosis not present

## 2014-07-11 DIAGNOSIS — H02052 Trichiasis without entropian right lower eyelid: Secondary | ICD-10-CM | POA: Diagnosis not present

## 2014-07-11 DIAGNOSIS — Z961 Presence of intraocular lens: Secondary | ICD-10-CM | POA: Diagnosis not present

## 2014-07-11 DIAGNOSIS — H4011X3 Primary open-angle glaucoma, severe stage: Secondary | ICD-10-CM | POA: Diagnosis not present

## 2014-07-11 DIAGNOSIS — H0289 Other specified disorders of eyelid: Secondary | ICD-10-CM | POA: Diagnosis not present

## 2014-07-18 ENCOUNTER — Encounter: Payer: Self-pay | Admitting: Cardiology

## 2014-08-07 DIAGNOSIS — R404 Transient alteration of awareness: Secondary | ICD-10-CM | POA: Diagnosis not present

## 2014-08-07 DIAGNOSIS — R531 Weakness: Secondary | ICD-10-CM | POA: Diagnosis not present

## 2014-08-08 ENCOUNTER — Emergency Department (HOSPITAL_COMMUNITY): Payer: Medicare Other

## 2014-08-08 ENCOUNTER — Inpatient Hospital Stay (HOSPITAL_COMMUNITY): Payer: Medicare Other

## 2014-08-08 ENCOUNTER — Inpatient Hospital Stay (HOSPITAL_COMMUNITY)
Admission: EM | Admit: 2014-08-08 | Discharge: 2014-08-12 | DRG: 683 | Disposition: A | Discharge status: Home Health | Payer: Medicare Other | Attending: Internal Medicine | Admitting: Internal Medicine

## 2014-08-08 ENCOUNTER — Encounter (HOSPITAL_COMMUNITY): Payer: Self-pay

## 2014-08-08 DIAGNOSIS — Z79899 Other long term (current) drug therapy: Secondary | ICD-10-CM | POA: Diagnosis not present

## 2014-08-08 DIAGNOSIS — J449 Chronic obstructive pulmonary disease, unspecified: Secondary | ICD-10-CM

## 2014-08-08 DIAGNOSIS — I255 Ischemic cardiomyopathy: Secondary | ICD-10-CM | POA: Diagnosis present

## 2014-08-08 DIAGNOSIS — N179 Acute kidney failure, unspecified: Principal | ICD-10-CM

## 2014-08-08 DIAGNOSIS — K219 Gastro-esophageal reflux disease without esophagitis: Secondary | ICD-10-CM | POA: Diagnosis present

## 2014-08-08 DIAGNOSIS — N183 Chronic kidney disease, stage 3 (moderate): Secondary | ICD-10-CM | POA: Diagnosis present

## 2014-08-08 DIAGNOSIS — E119 Type 2 diabetes mellitus without complications: Secondary | ICD-10-CM | POA: Diagnosis present

## 2014-08-08 DIAGNOSIS — Z7982 Long term (current) use of aspirin: Secondary | ICD-10-CM | POA: Diagnosis not present

## 2014-08-08 DIAGNOSIS — Z951 Presence of aortocoronary bypass graft: Secondary | ICD-10-CM

## 2014-08-08 DIAGNOSIS — Z87891 Personal history of nicotine dependence: Secondary | ICD-10-CM

## 2014-08-08 DIAGNOSIS — Z794 Long term (current) use of insulin: Secondary | ICD-10-CM | POA: Diagnosis not present

## 2014-08-08 DIAGNOSIS — R404 Transient alteration of awareness: Secondary | ICD-10-CM | POA: Diagnosis not present

## 2014-08-08 DIAGNOSIS — N189 Chronic kidney disease, unspecified: Secondary | ICD-10-CM | POA: Diagnosis not present

## 2014-08-08 DIAGNOSIS — R339 Retention of urine, unspecified: Secondary | ICD-10-CM | POA: Diagnosis present

## 2014-08-08 DIAGNOSIS — I252 Old myocardial infarction: Secondary | ICD-10-CM

## 2014-08-08 DIAGNOSIS — R531 Weakness: Secondary | ICD-10-CM | POA: Diagnosis not present

## 2014-08-08 DIAGNOSIS — Z95810 Presence of automatic (implantable) cardiac defibrillator: Secondary | ICD-10-CM | POA: Diagnosis not present

## 2014-08-08 DIAGNOSIS — J438 Other emphysema: Secondary | ICD-10-CM | POA: Diagnosis not present

## 2014-08-08 DIAGNOSIS — N281 Cyst of kidney, acquired: Secondary | ICD-10-CM | POA: Diagnosis not present

## 2014-08-08 DIAGNOSIS — E1129 Type 2 diabetes mellitus with other diabetic kidney complication: Secondary | ICD-10-CM | POA: Diagnosis not present

## 2014-08-08 DIAGNOSIS — N320 Bladder-neck obstruction: Secondary | ICD-10-CM | POA: Diagnosis present

## 2014-08-08 DIAGNOSIS — R112 Nausea with vomiting, unspecified: Secondary | ICD-10-CM | POA: Diagnosis not present

## 2014-08-08 DIAGNOSIS — I129 Hypertensive chronic kidney disease with stage 1 through stage 4 chronic kidney disease, or unspecified chronic kidney disease: Secondary | ICD-10-CM | POA: Diagnosis present

## 2014-08-08 DIAGNOSIS — I10 Essential (primary) hypertension: Secondary | ICD-10-CM | POA: Diagnosis present

## 2014-08-08 DIAGNOSIS — I2510 Atherosclerotic heart disease of native coronary artery without angina pectoris: Secondary | ICD-10-CM | POA: Diagnosis present

## 2014-08-08 DIAGNOSIS — R509 Fever, unspecified: Secondary | ICD-10-CM | POA: Diagnosis not present

## 2014-08-08 DIAGNOSIS — I259 Chronic ischemic heart disease, unspecified: Secondary | ICD-10-CM | POA: Diagnosis not present

## 2014-08-08 DIAGNOSIS — E872 Acidosis: Secondary | ICD-10-CM | POA: Diagnosis present

## 2014-08-08 DIAGNOSIS — E785 Hyperlipidemia, unspecified: Secondary | ICD-10-CM | POA: Diagnosis present

## 2014-08-08 LAB — COMPREHENSIVE METABOLIC PANEL
ALT: 9 U/L (ref 0–53)
AST: 9 U/L (ref 0–37)
Albumin: 3.2 g/dL — ABNORMAL LOW (ref 3.5–5.2)
Alkaline Phosphatase: 59 U/L (ref 39–117)
Anion gap: 17 — ABNORMAL HIGH (ref 5–15)
BILIRUBIN TOTAL: 0.6 mg/dL (ref 0.3–1.2)
BUN: 55 mg/dL — ABNORMAL HIGH (ref 6–23)
CHLORIDE: 102 meq/L (ref 96–112)
CO2: 15 meq/L — AB (ref 19–32)
Calcium: 9 mg/dL (ref 8.4–10.5)
Creatinine, Ser: 3.45 mg/dL — ABNORMAL HIGH (ref 0.50–1.35)
GFR calc Af Amer: 18 mL/min — ABNORMAL LOW (ref >90)
GFR, EST NON AFRICAN AMERICAN: 16 mL/min — AB (ref >90)
Glucose, Bld: 243 mg/dL — ABNORMAL HIGH (ref 70–99)
Potassium: 4.8 mEq/L (ref 3.7–5.3)
Sodium: 134 mEq/L — ABNORMAL LOW (ref 137–147)
Total Protein: 6.7 g/dL (ref 6.0–8.3)

## 2014-08-08 LAB — CBC WITH DIFFERENTIAL/PLATELET
BASOS ABS: 0 10*3/uL (ref 0.0–0.1)
Basophils Relative: 0 % (ref 0–1)
Eosinophils Absolute: 0 10*3/uL (ref 0.0–0.7)
Eosinophils Relative: 0 % (ref 0–5)
HEMATOCRIT: 32.6 % — AB (ref 39.0–52.0)
HEMOGLOBIN: 11.2 g/dL — AB (ref 13.0–17.0)
LYMPHS PCT: 11 % — AB (ref 12–46)
Lymphs Abs: 1 10*3/uL (ref 0.7–4.0)
MCH: 31.8 pg (ref 26.0–34.0)
MCHC: 34.4 g/dL (ref 30.0–36.0)
MCV: 92.6 fL (ref 78.0–100.0)
MONO ABS: 0.7 10*3/uL (ref 0.1–1.0)
Monocytes Relative: 7 % (ref 3–12)
NEUTROS ABS: 7.7 10*3/uL (ref 1.7–7.7)
Neutrophils Relative %: 82 % — ABNORMAL HIGH (ref 43–77)
Platelets: 122 10*3/uL — ABNORMAL LOW (ref 150–400)
RBC: 3.52 MIL/uL — ABNORMAL LOW (ref 4.22–5.81)
RDW: 12.9 % (ref 11.5–15.5)
WBC: 9.4 10*3/uL (ref 4.0–10.5)

## 2014-08-08 LAB — URINALYSIS, ROUTINE W REFLEX MICROSCOPIC
BILIRUBIN URINE: NEGATIVE
Glucose, UA: 100 mg/dL — AB
Hgb urine dipstick: NEGATIVE
KETONES UR: NEGATIVE mg/dL
Leukocytes, UA: NEGATIVE
Nitrite: NEGATIVE
PROTEIN: NEGATIVE mg/dL
Specific Gravity, Urine: 1.017 (ref 1.005–1.030)
Urobilinogen, UA: 0.2 mg/dL (ref 0.0–1.0)
pH: 5 (ref 5.0–8.0)

## 2014-08-08 LAB — I-STAT CG4 LACTIC ACID, ED: Lactic Acid, Venous: 1.52 mmol/L (ref 0.5–2.2)

## 2014-08-08 LAB — GLUCOSE, CAPILLARY
GLUCOSE-CAPILLARY: 183 mg/dL — AB (ref 70–99)
GLUCOSE-CAPILLARY: 171 mg/dL — AB (ref 70–99)

## 2014-08-08 LAB — TROPONIN I: Troponin I: <0.3 ng/mL (ref <0.30)

## 2014-08-08 LAB — SODIUM, URINE, RANDOM: Sodium, Ur: 44 mEq/L

## 2014-08-08 MED ORDER — CALCITRIOL 0.25 MCG PO CAPS
0.2500 ug | ORAL_CAPSULE | Freq: Every day | ORAL | Status: DC
  Administered 2014-08-08 – 2014-08-12 (×5): 0.25 ug via ORAL
  Filled 2014-08-08 (×5): qty 1

## 2014-08-08 MED ORDER — OMEGA-3-ACID ETHYL ESTERS 1 G PO CAPS
2.0000 g | ORAL_CAPSULE | Freq: Two times a day (BID) | ORAL | Status: DC
  Administered 2014-08-08 – 2014-08-12 (×9): 2 g via ORAL
  Filled 2014-08-08 (×10): qty 2

## 2014-08-08 MED ORDER — SIMVASTATIN 40 MG PO TABS
40.0000 mg | ORAL_TABLET | Freq: Every day | ORAL | Status: DC
  Administered 2014-08-08 – 2014-08-11 (×4): 40 mg via ORAL
  Filled 2014-08-08 (×5): qty 1

## 2014-08-08 MED ORDER — SODIUM CHLORIDE 0.9 % IV SOLN
INTRAVENOUS | Status: DC
  Administered 2014-08-08 – 2014-08-09 (×2): via INTRAVENOUS

## 2014-08-08 MED ORDER — VITAMIN D3 25 MCG (1000 UNIT) PO TABS
1000.0000 [IU] | ORAL_TABLET | Freq: Every day | ORAL | Status: DC
  Administered 2014-08-08 – 2014-08-12 (×5): 1000 [IU] via ORAL
  Filled 2014-08-08 (×5): qty 1

## 2014-08-08 MED ORDER — ALBUTEROL SULFATE (2.5 MG/3ML) 0.083% IN NEBU: 2.5000 mg | INHALATION_SOLUTION | RESPIRATORY_TRACT | Status: DC | PRN

## 2014-08-08 MED ORDER — FLUTICASONE PROPIONATE HFA 110 MCG/ACT IN AERO
1.0000 | INHALATION_SPRAY | Freq: Every day | RESPIRATORY_TRACT | Status: DC
  Administered 2014-08-09 – 2014-08-12 (×4): 1 via RESPIRATORY_TRACT
  Filled 2014-08-08: qty 12

## 2014-08-08 MED ORDER — FLUTICASONE PROPIONATE HFA 110 MCG/ACT IN AERO
1.0000 | INHALATION_SPRAY | Freq: Every day | RESPIRATORY_TRACT | Status: DC
  Filled 2014-08-08: qty 12

## 2014-08-08 MED ORDER — ACETAMINOPHEN 325 MG PO TABS
650.0000 mg | ORAL_TABLET | Freq: Four times a day (QID) | ORAL | Status: DC | PRN
  Administered 2014-08-08 – 2014-08-09 (×3): 650 mg via ORAL
  Filled 2014-08-08 (×4): qty 2

## 2014-08-08 MED ORDER — PANTOPRAZOLE SODIUM 40 MG PO TBEC
40.0000 mg | DELAYED_RELEASE_TABLET | Freq: Every day | ORAL | Status: DC
  Administered 2014-08-08 – 2014-08-12 (×5): 40 mg via ORAL
  Filled 2014-08-08 (×5): qty 1

## 2014-08-08 MED ORDER — ONDANSETRON HCL 4 MG PO TABS: 4.0000 mg | ORAL_TABLET | Freq: Four times a day (QID) | ORAL | Status: DC | PRN

## 2014-08-08 MED ORDER — LEVOTHYROXINE SODIUM 50 MCG PO TABS
50.0000 ug | ORAL_TABLET | Freq: Every day | ORAL | Status: DC
  Administered 2014-08-09 – 2014-08-12 (×4): 50 ug via ORAL
  Filled 2014-08-08 (×5): qty 1

## 2014-08-08 MED ORDER — GABAPENTIN 300 MG PO CAPS
300.0000 mg | ORAL_CAPSULE | Freq: Three times a day (TID) | ORAL | Status: DC
  Administered 2014-08-08 – 2014-08-11 (×9): 300 mg via ORAL
  Filled 2014-08-08 (×11): qty 1

## 2014-08-08 MED ORDER — HEPARIN SODIUM (PORCINE) 5000 UNIT/ML IJ SOLN
5000.0000 [IU] | Freq: Three times a day (TID) | INTRAMUSCULAR | Status: DC
  Administered 2014-08-08 – 2014-08-12 (×12): 5000 [IU] via SUBCUTANEOUS
  Filled 2014-08-08 (×15): qty 1

## 2014-08-08 MED ORDER — OMEGA-3 FATTY ACIDS 1000 MG PO CAPS: 2.0000 g | ORAL_CAPSULE | Freq: Two times a day (BID) | ORAL | Status: DC

## 2014-08-08 MED ORDER — INSULIN ASPART 100 UNIT/ML ~~LOC~~ SOLN
0.0000 [IU] | Freq: Three times a day (TID) | SUBCUTANEOUS | Status: DC
  Administered 2014-08-08: 2 [IU] via SUBCUTANEOUS
  Administered 2014-08-09: 1 [IU] via SUBCUTANEOUS
  Administered 2014-08-09: 3 [IU] via SUBCUTANEOUS
  Administered 2014-08-10: 1 [IU] via SUBCUTANEOUS
  Administered 2014-08-10: 2 [IU] via SUBCUTANEOUS
  Administered 2014-08-10: 3 [IU] via SUBCUTANEOUS
  Administered 2014-08-11: 1 [IU] via SUBCUTANEOUS
  Administered 2014-08-11: 2 [IU] via SUBCUTANEOUS
  Administered 2014-08-11: 5 [IU] via SUBCUTANEOUS
  Administered 2014-08-12: 2 [IU] via SUBCUTANEOUS
  Administered 2014-08-12: 5 [IU] via SUBCUTANEOUS

## 2014-08-08 MED ORDER — ONDANSETRON HCL 4 MG/2ML IJ SOLN: 4.0000 mg | Freq: Four times a day (QID) | INTRAMUSCULAR | Status: DC | PRN

## 2014-08-08 MED ORDER — SODIUM CHLORIDE 0.9 % IV BOLUS (SEPSIS)
500.0000 mL | Freq: Once | INTRAVENOUS | Status: AC
  Administered 2014-08-08: 500 mL via INTRAVENOUS

## 2014-08-08 MED ORDER — FLUTICASONE PROPIONATE (INHAL) 100 MCG/BLIST IN AEPB: 1.0000 | INHALATION_SPRAY | Freq: Every day | RESPIRATORY_TRACT | Status: DC

## 2014-08-08 MED ORDER — ASPIRIN 325 MG PO TABS
325.0000 mg | ORAL_TABLET | Freq: Every day | ORAL | Status: DC
  Administered 2014-08-08 – 2014-08-12 (×5): 325 mg via ORAL
  Filled 2014-08-08 (×5): qty 1

## 2014-08-08 MED ORDER — ACETAMINOPHEN 650 MG RE SUPP: 650.0000 mg | Freq: Four times a day (QID) | RECTAL | Status: DC | PRN

## 2014-08-08 MED ORDER — LATANOPROST 0.005 % OP SOLN
1.0000 [drp] | Freq: Every day | OPHTHALMIC | Status: DC
  Administered 2014-08-08 – 2014-08-11 (×4): 1 [drp] via OPHTHALMIC
  Filled 2014-08-08: qty 2.5

## 2014-08-08 NOTE — ED Notes (Signed)
Bed: VO59 Expected date:  Expected time:  Means of arrival:  Comments: Ems- 80 yo M, lethargy and diarrhea

## 2014-08-08 NOTE — ED Notes (Signed)
Per EMS, pt from home.  Pt has had fever, lethargy, diarrhea x 4 days.  No meds to control fever or diarrhea.  Alert and oriented.  Normally ambulatory.  Vitals: 142/74, hr 80, resp 14, cbg 300, 95% ra.  20 g rt hand.  150cc NS in route.

## 2014-08-08 NOTE — H&P (Signed)
Patient Demographics  Patrick Boyle, is a 80 y.o. male  MRN: [id number]   DOB - 1934/05/30  Admit Date - 08/08/2014  Outpatient Primary MD for the patient is FRIED, Jaymes Graff, MD   With History of -  Past Medical History  Diagnosis Date  . Diabetes mellitus     dr Olen Pel  . Hyperlipidemia   . Hypertension   . Coronary artery disease     s/p CABG 1997  . GERD (gastroesophageal reflux disease)   . Arthritis   . Allergic rhinitis   . Hyperplastic colon polyp   . Diverticulitis, colon   . Meniere's syndrome   . Tremor, essential     dr Jannifer Franklin  . COPD, mild     dr clance  . MI (myocardial infarction) 1997, 2005  . Ischemic cardiomyopathy     sp ICD (SJM) implanted by Dr Rayann Heman      Past Surgical History  Procedure Laterality Date  . Coronary artery bypass graft  [place]  . Cardiac catheterization  2005    medical therapy  . Cardiac defibrillator placement      ICD implanted by Dr Rayann Heman, Analyze ST study patient    in for   Chief Complaint  Patient presents with  . Fever  . Diarrhea     HPI  Patrick Boyle  is a 80 y.o. male, with past medical history of chronic kidney disease, baseline creatinine 2.6 a few months ago, diabetes mellitus, coronary artery disease, arthritis, ischemic cardiomyopathy status post ICD, presents with complaints of weakness for the last 2 days, patient reports he has not been feeling well, having poor appetite, complaining of nausea and vomiting, diarrhea over the last 48 hours, denies any coffee-ground emesis, any melena or bright red blood per rectum, patient had fever in ED 101.8 , urinalysis was negative, chest x-ray does not show any opacity or infiltrate, denies any cough or productive sputum, and was in acute renal failure with a creatinine of 3.5, daughter at bedside reports he has poor oral intake, he denies any sick contacts, chest pain or shortness of breath.    Review of Systems    In addition to the HPI above, Febrile    No Headache, No changes with Vision or hearing, No problems swallowing food or Liquids, but has poor appetite No Chest pain, Cough or Shortness of Breath, No Abdominal pain, but complains of Nausea or Vommitting, and diarrhea over last 48 hours. No Blood in stool or Urine, No dysuria, No new skin rashes or bruises, No new joints pains-aches,  No new weakness, tingling, numbness in any extremity, No recent weight gain or loss, No polyuria, polydypsia or polyphagia, No significant Mental Stressors.  A full 10 point Review of Systems was done, except as stated above, all other Review of Systems were negative.   Social History History  Substance Use Topics  . Smoking status: Former Smoker -- 35 years    Types: Cigarettes    Quit date: 09/01/1995  . Smokeless tobacco: Not on file  . Alcohol Use: 0.0 oz/week     Comment: occasional scotch     Family History Family History  Problem Relation Age of Onset  . Parkinsonism Father   . Stomach cancer Mother   . Diabetes Mother      Prior to Admission medications   Medication Sig Start Date End Date Taking? Authorizing Provider  Acetaminophen (ARTHRITIS PAIN RELIEF PO) Take 650 mg by mouth  2 (two) times daily.    Yes Historical Provider, MD  aspirin 325 MG tablet Take 325 mg by mouth daily.     Yes Historical Provider, MD  bimatoprost (LUMIGAN) 0.03 % ophthalmic solution Apply 1 drop to eye every evening.     Yes Historical Provider, MD  calcitRIOL (ROCALTROL) 0.25 MCG capsule Take 0.25 mcg by mouth daily.   Yes Historical Provider, MD  cetirizine (ZYRTEC) 10 MG tablet Take 5 mg by mouth daily.     Yes Historical Provider, MD  cholecalciferol (VITAMIN D) 1000 UNITS tablet Take 1,000 Units by mouth daily.   Yes Historical Provider, MD  donepezil (ARICEPT) 10 MG tablet Take 10 mg by mouth at bedtime.   Yes Historical Provider, MD  fish oil-omega-3 fatty acids 1000 MG capsule Take 2 g by mouth 2 (two) times daily.    Yes Historical  Provider, MD  Fluticasone Propionate, Inhal, (FLOVENT DISKUS) 100 MCG/BLIST AEPB Inhale 1 puff into the lungs daily.    Yes Historical Provider, MD  furosemide (LASIX) 20 MG tablet Take 20 mg by mouth every other day.    Yes Historical Provider, MD  gabapentin (NEURONTIN) 300 MG capsule Take 300 mg by mouth 3 (three) times daily.   Yes Historical Provider, MD  glimepiride (AMARYL) 4 MG tablet Take 4 mg by mouth 2 (two) times daily.     Yes Historical Provider, MD  lansoprazole (PREVACID) 30 MG capsule Take 30 mg by mouth daily as needed (for acid reflex).    Yes Historical Provider, MD  levothyroxine (SYNTHROID, LEVOTHROID) 50 MCG tablet Take 50 mcg by mouth daily before breakfast.   Yes Historical Provider, MD  meclizine (ANTIVERT) 25 MG tablet Take 25 mg by mouth daily as needed (for vertigo).    Yes Historical Provider, MD  psyllium (REGULOID) 0.52 G capsule Take 0.52 g by mouth daily.   Yes Historical Provider, MD  simvastatin (ZOCOR) 40 MG tablet Take 40 mg by mouth at bedtime.     Yes Historical Provider, MD  sitaGLIPtin (JANUVIA) 50 MG tablet Take 50 mg by mouth daily with breakfast.   Yes Historical Provider, MD    Allergies  Allergen Reactions  . Metformin And Related Other (See Comments)    GI sensitivity with high doses  . Ramipril Other (See Comments)    Cough    Physical Exam  Vitals  Blood pressure 132/62, pulse 77, temperature 100.3 F (37.9 C), temperature source Rectal, resp. rate 18, SpO2 96 %.   1. General frail elderly male lying in bed in NAD,    2. Normal affect and insight, Not Suicidal or Homicidal, Awake Alert, Oriented .  3. No F.N deficits, ALL C.Nerves Intact, Strength 5/5 all 4 extremities, Sensation intact all 4 extremities, Plantars down going.  4. Ears and Eyes appear Normal, Conjunctivae clear, PERRLA. Dry Oral Mucosa.  5. Supple Neck, No JVD, No cervical lymphadenopathy appriciated, No Carotid Bruits.  6. Symmetrical Chest wall movement, Good  air movement bilaterally, CTAB.  7. RRR, No Gallops, Rubs or Murmurs, No Parasternal Heave.  8. Positive Bowel Sounds, Abdomen Soft, No tenderness, No organomegaly appriciated,No rebound -guarding or rigidity.  9.  No Cyanosis, delayed Skin Turgor, No Skin Rash or Bruise.  10. Good muscle tone,  joints appear normal , no effusions, Normal ROM.    Data Review  CBC  Recent Labs Lab 08/08/14 1010  WBC 9.4  HGB 11.2*  HCT 32.6*  PLT 122*  MCV 92.6  MCH 31.8  MCHC 34.4  RDW 12.9  LYMPHSABS 1.0  MONOABS 0.7  EOSABS 0.0  BASOSABS 0.0   ------------------------------------------------------------------------------------------------------------------  Chemistries   Recent Labs Lab 08/08/14 1010  NA 134*  K 4.8  CL 102  CO2 15*  GLUCOSE 243*  BUN 55*  CREATININE 3.45*  CALCIUM 9.0  AST 9  ALT 9  ALKPHOS 59  BILITOT 0.6   ------------------------------------------------------------------------------------------------------------------ CrCl cannot be calculated (Unknown ideal weight.). ------------------------------------------------------------------------------------------------------------------ No results for input(s): TSH, T4TOTAL, T3FREE, THYROIDAB in the last 72 hours.  Invalid input(s): FREET3   Coagulation profile No results for input(s): INR, PROTIME in the last 168 hours. ------------------------------------------------------------------------------------------------------------------- No results for input(s): DDIMER in the last 72 hours. -------------------------------------------------------------------------------------------------------------------  Cardiac Enzymes  Recent Labs Lab 08/08/14 1010  TROPONINI <0.30   ------------------------------------------------------------------------------------------------------------------ Invalid input(s):  POCBNP   ---------------------------------------------------------------------------------------------------------------  Urinalysis    Component Value Date/Time   COLORURINE YELLOW 08/08/2014 1114   APPEARANCEUR CLOUDY* 08/08/2014 1114   LABSPEC 1.017 08/08/2014 1114   PHURINE 5.0 08/08/2014 1114   GLUCOSEU 100* 08/08/2014 1114   HGBUR NEGATIVE 08/08/2014 1114   BILIRUBINUR NEGATIVE 08/08/2014 1114   KETONESUR NEGATIVE 08/08/2014 1114   PROTEINUR NEGATIVE 08/08/2014 1114   UROBILINOGEN 0.2 08/08/2014 1114   NITRITE NEGATIVE 08/08/2014 1114   LEUKOCYTESUR NEGATIVE 08/08/2014 1114    ----------------------------------------------------------------------------------------------------------------  Imaging results:   Dg Chest Port 1 View  08/08/2014   CLINICAL DATA:  80 year old male with fever weakness and diarrhea for 4 days. Initial encounter.  EXAM: PORTABLE CHEST - 1 VIEW  COMPARISON:  07/21/2013 and earlier.  FINDINGS: Portable AP upright view at 1018 hrs. Stable left chest single lead cardiac AICD. Sequelae of median sternotomy appears stable. Stable cardiomegaly and mediastinal contours. Lung volumes not significantly changed with no pneumothorax, pulmonary edema, pleural effusion or confluent pulmonary opacity.  IMPRESSION: No acute cardiopulmonary abnormality.   Electronically Signed   By: Lars Pinks M.D.   On: 08/08/2014 10:29        Assessment & Plan  Principal Problem:   Acute renal failure Active Problems:   Fever   Diabetes mellitus   Hyperlipemia   Essential hypertension   Chronic ischemic heart disease   GERD   COPD (chronic obstructive pulmonary disease)    Fever/nausea/vomiting/diarrhea: - Is most likely related to gastroenteritis, is most likely viral, will check stool culture and GI pathogen, When Necessary  Nausea Medicine, hydrate, and check abdominal x-ray.  Acute on chronic renal failure -Most likely related to volume depletion and dehydration  from gastroenteritis, will hold Lasix, will continue with IV fluids, will check renal ultrasound, will monitor closely for urinary retention, scan done in ED showing to 240 mL post void volume.  Diabetes mellitus -We'll hold oral agents, will start an insulin sliding scale while in hospital.  Hyperlipidemia -continue with fish oil  GERD -Continue with PPI  COPD -No active wheezing, continue with home medication and when necessary albuterol.  History of chronic ischemic heart disease -That is post ICD, denies any chest pain or shortness of breath. -Continue with aspirin, no ACE or ARB given his renal failure.   DVT Prophylaxis Heparin -  AM Labs Ordered, also please review Full Orders  Family Communication: Admission, patients condition and plan of care including tests being ordered have been discussed with the patient and daughter who indicate understanding and agree with the plan and Code Status.  Code Status full code  Disposition: Admit to inpatient  Condition GUARDED    Time spent in minutes :  65 minutes    Chanda Laperle M.D on 08/08/2014 at 12:58 PM  Between 7am to 7pm - Pager - [phone]  After 7pm go to [email or web address] - password [email or web address]  And look for the night coverage person covering me after hours  [facility] Office  [phone]   **Disclaimer: This note may have been dictated with voice recognition software. Similar sounding words can inadvertently be transcribed and this note may contain transcription errors which may not have been corrected upon publication of note.**

## 2014-08-08 NOTE — ED Notes (Signed)
Unable to get second set of blood cultures.

## 2014-08-08 NOTE — Progress Notes (Signed)
UR COMPLETED  

## 2014-08-08 NOTE — Progress Notes (Signed)
Patient has been seen by the admission nurse. Patient's history obtained from patient and help from daughter.

## 2014-08-08 NOTE — ED Provider Notes (Signed)
CSN: [id number]     Arrival date & time 08/08/14  6789 History   First MD Initiated Contact with Patient 08/08/14 [id number]     Chief Complaint  Patient presents with  . Fever  . Diarrhea     Patient is a 80 y.o. male presenting with fever and diarrhea. The history is provided by the patient.  Fever Associated symptoms: diarrhea   Diarrhea Associated symptoms: fever    Patrick Boyle presents for feeling sick.  He started feeling sick 2-3 days ago.  Described as feeling bad, initially had diarrhea, which has since resolved.  Had vomiting, but none since yesterday.  He has fever in ED but pt is not aware of fever and reports no fevers at home.  No dysuria, cough, sob, cp, abdominal pain.  Had left sided chest pain yesterday, felt like burning, but now it is gone.  He reports considerable strength and needs assistance with movement.  Arthritis in hands worsening.  Sxs are moderate, constant, and worsening.  Per family they report he has been sick with V/D and intermittent confusion.  He has been sleeping more than usual for the last few days and hard to wake up.  He has not been eating or drinking.  He has a hx/o renal insuffiency and sees Dr. Lorrene Reid.    Past Medical History  Diagnosis Date  . Diabetes mellitus     dr Olen Pel  . Hyperlipidemia   . Hypertension   . Coronary artery disease     s/p CABG 1997  . GERD (gastroesophageal reflux disease)   . Arthritis   . Allergic rhinitis   . Hyperplastic colon polyp   . Diverticulitis, colon   . Meniere's syndrome   . Tremor, essential     dr Jannifer Franklin  . COPD, mild     dr clance  . MI (myocardial infarction) 1997, 2005  . Ischemic cardiomyopathy     sp ICD (SJM) implanted by Dr Rayann Heman   Past Surgical History  Procedure Laterality Date  . Coronary artery bypass graft  [place]  . Cardiac catheterization  2005    medical therapy  . Cardiac defibrillator placement      ICD implanted by Dr Rayann Heman, Analyze ST study patient   Family History   Problem Relation Age of Onset  . Parkinsonism Father   . Stomach cancer Mother   . Diabetes Mother    History  Substance Use Topics  . Smoking status: Former Smoker -- 35 years    Types: Cigarettes    Quit date: 09/01/1995  . Smokeless tobacco: Not on file  . Alcohol Use: 0.0 oz/week     Comment: occasional scotch    Review of Systems  Constitutional: Positive for fever.  Gastrointestinal: Positive for diarrhea.  All other systems reviewed and are negative.     Allergies  Metformin and related and Ramipril  Home Medications   Prior to Admission medications   Medication Sig Start Date End Date Taking? Authorizing Provider  Acetaminophen (ARTHRITIS PAIN RELIEF PO) Take 650 mg by mouth as needed.      Historical Provider, MD  amitriptyline (ELAVIL) 25 MG tablet Take 25 mg by mouth at bedtime.    Historical Provider, MD  aspirin 325 MG tablet Take 325 mg by mouth daily.      Historical Provider, MD  bimatoprost (LUMIGAN) 0.03 % ophthalmic solution Apply 1 drop to eye every evening.      Historical Provider, MD  calcitRIOL (  ROCALTROL) 0.25 MCG capsule Take 0.25 mcg by mouth daily.    Historical Provider, MD  cetirizine (ZYRTEC) 10 MG tablet Take 5 mg by mouth daily.      Historical Provider, MD  donepezil (ARICEPT ODT) 10 MG disintegrating tablet Take 10 mg by mouth at bedtime.    Historical Provider, MD  fish oil-omega-3 fatty acids 1000 MG capsule Take 2 g by mouth 2 (two) times daily.     Historical Provider, MD  Fluticasone Propionate, Inhal, (FLOVENT DISKUS) 100 MCG/BLIST AEPB Inhale 1 puff into the lungs daily.     Historical Provider, MD  furosemide (LASIX) 20 MG tablet Take 20 mg by mouth every other day.     Historical Provider, MD  glimepiride (AMARYL) 4 MG tablet Take 4 mg by mouth 2 (two) times daily.      Historical Provider, MD  lansoprazole (PREVACID) 30 MG capsule Take 30 mg by mouth as needed.      Historical Provider, MD  levothyroxine (SYNTHROID, LEVOTHROID)  50 MCG tablet Take 50 mcg by mouth daily before breakfast.    Historical Provider, MD  meclizine (ANTIVERT) 25 MG tablet Take 5 mg by mouth as needed.     Historical Provider, MD  metoprolol succinate (TOPROL-XL) 25 MG 24 hr tablet Take 25 mg by mouth daily.    Historical Provider, MD  simvastatin (ZOCOR) 40 MG tablet Take 40 mg by mouth at bedtime.      Historical Provider, MD   BP 132/62 mmHg  Pulse 77  Temp(Src) 101.8 F (38.8 C) (Rectal)  Resp 18  SpO2 96% Physical Exam  Constitutional: He is oriented to person, place, and time. He appears well-developed and well-nourished.  Mild distress  HENT:  Head: Normocephalic and atraumatic.  Eyes: Pupils are equal, round, and reactive to light.  Cardiovascular: Normal rate and regular rhythm.   No murmur heard. Pulmonary/Chest: Effort normal and breath sounds normal. No respiratory distress.  Abdominal: Soft. There is no tenderness. There is no rebound and no guarding.  Musculoskeletal:  Mild swelling of bilateral hands without erythema  Neurological: He is alert and oriented to person, place, and time.  Generalized weakness, cannot sit up without assistance  Skin: Skin is warm and dry.  Psychiatric: He has a normal mood and affect. His behavior is normal.  Nursing note and vitals reviewed.   ED Course  Procedures (including critical care time) Labs Review Labs Reviewed  CBC WITH DIFFERENTIAL - Abnormal; Notable for the following:    RBC 3.52 (*)    Hemoglobin 11.2 (*)    HCT 32.6 (*)    Platelets 122 (*)    Neutrophils Relative % 82 (*)    Lymphocytes Relative 11 (*)    All other components within normal limits  COMPREHENSIVE METABOLIC PANEL - Abnormal; Notable for the following:    Sodium 134 (*)    CO2 15 (*)    Glucose, Bld 243 (*)    BUN 55 (*)    Creatinine, Ser 3.45 (*)    Albumin 3.2 (*)    GFR calc non Af Amer 16 (*)    GFR calc Af Amer 18 (*)    Anion gap 17 (*)    All other components within normal limits   URINALYSIS, ROUTINE W REFLEX MICROSCOPIC - Abnormal; Notable for the following:    APPearance CLOUDY (*)    Glucose, UA 100 (*)    All other components within normal limits  URINE CULTURE  CULTURE, BLOOD (ROUTINE  X 2)  CULTURE, BLOOD (ROUTINE X 2)  TROPONIN I  I-STAT CG4 LACTIC ACID, ED    Imaging Review Dg Chest Port 1 View  08/08/2014   CLINICAL DATA:  80 year old male with fever weakness and diarrhea for 4 days. Initial encounter.  EXAM: PORTABLE CHEST - 1 VIEW  COMPARISON:  07/21/2013 and earlier.  FINDINGS: Portable AP upright view at 1018 hrs. Stable left chest single lead cardiac AICD. Sequelae of median sternotomy appears stable. Stable cardiomegaly and mediastinal contours. Lung volumes not significantly changed with no pneumothorax, pulmonary edema, pleural effusion or confluent pulmonary opacity.  IMPRESSION: No acute cardiopulmonary abnormality.   Electronically Signed   By: Lars Pinks M.D.   On: 08/08/2014 10:29     EKG Interpretation   Date/Time:  Wednesday August 08 2014 09:31:16 EST Ventricular Rate:  72 PR Interval:  206 QRS Duration: 141 QT Interval:  402 QTC Calculation: 440 R Axis:   -42 Text Interpretation:  Sinus rhythm Nonspecific IVCD with LAD Abnormal T,  consider ischemia, lateral leads Confirmed by Hazle Coca [phone]) on  08/08/2014 9:41:09 AM      MDM   Final diagnoses:  Febrile illness, acute  Acute renal failure, unspecified acute renal failure type    Pt here for evaluation of V/D and illness.  Pt febrile in ED with no clear source.  Pt has no abdominal pain by hx and non on exam. He has a nonfocal neurologic exam with generalized weakness.  BMP with increased creatine compared to baseline.  Discussed with medicine regarding admission for febrile illness as well as acute on chronic renal insufficiency.    Quintella Reichert, MD 08/08/14 [phone]

## 2014-08-08 NOTE — ED Notes (Signed)
Pt unable to void is drinking water and will attempt in 10 min

## 2014-08-08 NOTE — ED Notes (Signed)
Notified for report. Unit Garment/textile technologist at lunch.  Call back in 10 minutes.

## 2014-08-09 DIAGNOSIS — N179 Acute kidney failure, unspecified: Principal | ICD-10-CM

## 2014-08-09 DIAGNOSIS — I10 Essential (primary) hypertension: Secondary | ICD-10-CM

## 2014-08-09 DIAGNOSIS — R509 Fever, unspecified: Secondary | ICD-10-CM | POA: Insufficient documentation

## 2014-08-09 DIAGNOSIS — J438 Other emphysema: Secondary | ICD-10-CM

## 2014-08-09 DIAGNOSIS — N189 Chronic kidney disease, unspecified: Secondary | ICD-10-CM

## 2014-08-09 LAB — BASIC METABOLIC PANEL
Anion gap: 14 (ref 5–15)
BUN: 58 mg/dL — AB (ref 6–23)
CHLORIDE: 109 meq/L (ref 96–112)
CO2: 16 mEq/L — ABNORMAL LOW (ref 19–32)
CREATININE: 3.48 mg/dL — AB (ref 0.50–1.35)
Calcium: 8.6 mg/dL (ref 8.4–10.5)
GFR, EST AFRICAN AMERICAN: 18 mL/min — AB (ref >90)
GFR, EST NON AFRICAN AMERICAN: 15 mL/min — AB (ref >90)
GLUCOSE: 115 mg/dL — AB (ref 70–99)
POTASSIUM: 4.4 meq/L (ref 3.7–5.3)
Sodium: 139 mEq/L (ref 137–147)

## 2014-08-09 LAB — GLUCOSE, CAPILLARY
GLUCOSE-CAPILLARY: 132 mg/dL — AB (ref 70–99)
Glucose-Capillary: 127 mg/dL — ABNORMAL HIGH (ref 70–99)
Glucose-Capillary: 228 mg/dL — ABNORMAL HIGH (ref 70–99)
Glucose-Capillary: 113 mg/dL — ABNORMAL HIGH (ref 70–99)

## 2014-08-09 LAB — URINE CULTURE
CULTURE: NO GROWTH
Colony Count: NO GROWTH

## 2014-08-09 LAB — CBC
HCT: 28.5 % — ABNORMAL LOW (ref 39.0–52.0)
HEMOGLOBIN: 9.6 g/dL — AB (ref 13.0–17.0)
MCH: 31.7 pg (ref 26.0–34.0)
MCHC: 33.7 g/dL (ref 30.0–36.0)
MCV: 94.1 fL (ref 78.0–100.0)
Platelets: 116 10*3/uL — ABNORMAL LOW (ref 150–400)
RBC: 3.03 MIL/uL — AB (ref 4.22–5.81)
RDW: 13.1 % (ref 11.5–15.5)
WBC: 6.9 10*3/uL (ref 4.0–10.5)

## 2014-08-09 LAB — CK: Total CK: 150 U/L (ref 7–232)

## 2014-08-09 MED ORDER — SODIUM BICARBONATE 650 MG PO TABS
650.0000 mg | ORAL_TABLET | Freq: Two times a day (BID) | ORAL | Status: DC
  Administered 2014-08-09 – 2014-08-12 (×6): 650 mg via ORAL
  Filled 2014-08-09 (×7): qty 1

## 2014-08-09 MED ORDER — SODIUM CHLORIDE 0.9 % IV SOLN
INTRAVENOUS | Status: DC
  Administered 2014-08-09: 13:00:00 via INTRAVENOUS

## 2014-08-09 NOTE — Progress Notes (Signed)
Nutrition Brief Note  Patient identified on the Malnutrition Screening Tool (MST) Report  Pt reports N/V x 1 day. Prior to that, pt was eating well. Pt states that he was "never a big eater". Pt with history of diabetes, pt tries to follow diet recommendations.  Pt with no recent weight loss. Pt declines nutritional supplementation.  Wt Readings from Last 15 Encounters:  08/08/14 195 lb 5.2 oz (88.6 kg)  02/22/14 187 lb (84.823 kg)  09/20/13 192 lb (87.091 kg)  08/18/13 190 lb (86.183 kg)  04/03/13 190 lb 9.6 oz (86.456 kg)  04/18/12 190 lb 12.8 oz (86.546 kg)  11/13/11 174 lb (78.926 kg)  10/30/11 192 lb (87.091 kg)  05/13/11 195 lb (88.451 kg)  05/08/10 198 lb 4 oz (89.926 kg)  09/02/09 197 lb 4 oz (89.472 kg)    Body mass index is 27.25 kg/(m^2). Patient meets criteria for overweight based on current BMI.   Current diet order is CHO modified, patient is consuming approximately 100% of meals at this time. Labs and medications reviewed.   No nutrition interventions warranted at this time. If nutrition issues arise, please consult RD.   Clayton Bibles, MS, RD, LDN Pager: [phone] After Hours Pager: [phone]

## 2014-08-09 NOTE — Evaluation (Signed)
Physical Therapy Evaluation Patient Details Name: Patrick Boyle MRN: [id number] DOB: [birth year] Today's Date: 08/09/2014   History of Present Illness  pt was admitted with nausea, vomiting and diarrhea.  he has acute renal failure  Clinical Impression  Pt will benefit from PT to address deficits below; Will need HHPT, has RW at home if needed and if pt will use it    Follow Up Recommendations Home health PT    Equipment Recommendations  None recommended by PT    Recommendations for Other Services       Precautions / Restrictions Precautions Precautions: Fall      Mobility  Bed Mobility Overal bed mobility: Needs Assistance Bed Mobility: Supine to Sit     Supine to sit: Min guard;HOB elevated     General bed mobility comments: verbal cues for self assist  Transfers Overall transfer level: Needs assistance Equipment used: None;Rolling walker (2 wheeled) Transfers: Sit to/from Stand Sit to Stand: Min assist         General transfer comment: verbal cues for safe technique (backing up to chair),  cues for hand placement   Ambulation/Gait Ambulation/Gait assistance: Min assist Ambulation Distance (Feet): 90 Feet Assistive device: Rolling walker (2 wheeled) Gait Pattern/deviations: Step-through pattern;Decreased stride length     General Gait Details: decr knee flexion on right; verbal cues for RW safety; pt unsteady intially and required min assist to recover LOB on intial standing  Stairs            Wheelchair Mobility    Modified Rankin (Stroke Patients Only)       Balance Overall balance assessment: Needs assistance Sitting-balance support: Feet supported;No upper extremity supported Sitting balance-Leahy Scale: Fair     Standing balance support: Bilateral upper extremity supported;During functional activity Standing balance-Leahy Scale: Poor                               Pertinent Vitals/Pain Pain Assessment: No/denies pain     Home Living Family/patient expects to be discharged to:: Private residence Living Arrangements: Spouse/significant other   Type of Home: House Home Access: Stairs to enter   [employer] of Steps: 2 Home Layout: One level Home Equipment: Grab bars - toilet;Grab bars - tub/shower;Cane - single point;Walker - 2 wheels Additional Comments: pt wife is legally blind but she is pretty i per pt    Prior Function           Comments: uses cane at times   also furniture walks     Hand Dominance        Extremity/Trunk Assessment   Upper Extremity Assessment: Defer to OT evaluation;Generalized weakness           Lower Extremity Assessment: Generalized weakness         Communication   Communication: No difficulties  Cognition Arousal/Alertness: Awake/alert Behavior During Therapy: WFL for tasks assessed/performed Overall Cognitive Status: Within Functional Limits for tasks assessed                      General Comments      Exercises        Assessment/Plan    PT Assessment Patient needs continued PT services  PT Diagnosis Difficulty walking   PT Problem List Decreased strength;Decreased activity tolerance;Decreased balance;Decreased mobility;Decreased knowledge of use of DME  PT Treatment Interventions DME instruction;Gait training;Functional mobility training;Therapeutic activities;Therapeutic exercise;Patient/family education   PT Goals (Current goals can be  found in the Care Plan section) Acute Rehab PT Goals Patient Stated Goal: get stronger PT Goal Formulation: With patient Time For Goal Achievement: 08/16/14 Potential to Achieve Goals: Good    Frequency Min 3X/week   Barriers to discharge        Co-evaluation               End of Session Equipment Utilized During Treatment: Gait belt Activity Tolerance: Patient tolerated treatment well Patient left: in chair;with family/visitor present;with call bell/phone within  reach           Time: 1425-1444 PT Time Calculation (min) (ACUTE ONLY): 19 min   Charges:   PT Evaluation $Initial PT Evaluation Tier I: 1 Procedure PT Treatments $Gait Training: 8-22 mins   PT G Codes:          Patrick Boyle Sep 05, 2014, 4:21 PM

## 2014-08-09 NOTE — Consult Note (Signed)
Reason for Consult:AKI/CKD Referring Physician: Tat, MD  Patrick Boyle is an 80 y.o. male.  HPI: Pt is a 80yo WM with PMH sig for DM, HTN, CAD, ICMP, COPD, and CKD stage 3 with baseline Scr of 1.7-2.19 over the last 2 years with recent bumpt to 3 over the last 4 months (renal US with trabeculated bladder and some urinary retention and has been evaluated by Dr. Diona Fanti) who was admitted with a 3 week h/o progressive weakness/anorexia and a 2 day h/o N/V/D.  He was noted to have a Scr of 3.45 and was admitted for IVF's and further management.  We were asked to see the patient to further evaluate and manage his AKI/CKD.  The trend in Scr is seen below:   Trend in Creatinine: CREATININE, SER  Date/Time Value Ref Range Status  08/09/2014 04:00 AM 3.48* 0.50 - 1.35 mg/dL Final  08/08/2014 10:10 AM 3.45* 0.50 - 1.35 mg/dL Final  06/11/2014 2.67 0.76-1.27 mg/dL Final  05/10/2014 3.19 0.76-1.27 mg/dL Final  04/18/2014 3.02 0.76-1.27 mg/dL Final  12/13/2013 1.89 0.76-1.27 mg/dL Final  11/13/2011 01:20 PM 1.30 0.50 - 1.35 mg/dL Final  06/11/2011 09:11 AM 1.65* 0.50 - 1.35 mg/dL Final  06/04/2011 10:10 AM 1.9* 0.4 - 1.5 mg/dL Final  09/02/2009 04:10 PM 1.5 0.4-1.5 mg/dL Final    PMH:   Past Medical History  Diagnosis Date  . Diabetes mellitus     dr Olen Pel  . Hyperlipidemia   . Hypertension   . Coronary artery disease     s/p CABG 1997  . GERD (gastroesophageal reflux disease)   . Arthritis   . Allergic rhinitis   . Hyperplastic colon polyp   . Diverticulitis, colon   . Meniere's syndrome   . Tremor, essential     dr Jannifer Franklin  . COPD, mild     dr clance  . MI (myocardial infarction) 1997, 2005  . Ischemic cardiomyopathy     sp ICD (SJM) implanted by Dr Rayann Heman    PSH:   Past Surgical History  Procedure Laterality Date  . Coronary artery bypass graft  [place]  . Cardiac catheterization  2005    medical therapy  . Cardiac defibrillator placement      ICD implanted by Dr  Rayann Heman, Analyze ST study patient    Allergies:  Allergies  Allergen Reactions  . Metformin And Related Other (See Comments)    GI sensitivity with high doses  . Ramipril Other (See Comments)    Cough    Medications:   Prior to Admission medications   Medication Sig Start Date End Date Taking? Authorizing Provider  Acetaminophen (ARTHRITIS PAIN RELIEF PO) Take 650 mg by mouth 2 (two) times daily.    Yes Historical Provider, MD  aspirin 325 MG tablet Take 325 mg by mouth daily.     Yes Historical Provider, MD  bimatoprost (LUMIGAN) 0.03 % ophthalmic solution Place 1 drop into both eyes at bedtime.    Yes Historical Provider, MD  calcitRIOL (ROCALTROL) 0.25 MCG capsule Take 0.25 mcg by mouth daily.   Yes Historical Provider, MD  cetirizine (ZYRTEC) 10 MG tablet Take 5 mg by mouth daily.     Yes Historical Provider, MD  cholecalciferol (VITAMIN D) 1000 UNITS tablet Take 1,000 Units by mouth daily.   Yes Historical Provider, MD  donepezil (ARICEPT) 10 MG tablet Take 10 mg by mouth at bedtime.   Yes Historical Provider, MD  fish oil-omega-3 fatty acids 1000 MG capsule Take 2  g by mouth 2 (two) times daily.    Yes Historical Provider, MD  Fluticasone Propionate, Inhal, (FLOVENT DISKUS) 100 MCG/BLIST AEPB Inhale 1 puff into the lungs daily.    Yes Historical Provider, MD  furosemide (LASIX) 20 MG tablet Take 20 mg by mouth every other day.    Yes Historical Provider, MD  gabapentin (NEURONTIN) 300 MG capsule Take 300 mg by mouth 3 (three) times daily.   Yes Historical Provider, MD  glimepiride (AMARYL) 4 MG tablet Take 4 mg by mouth 2 (two) times daily.     Yes Historical Provider, MD  lansoprazole (PREVACID) 30 MG capsule Take 30 mg by mouth daily as needed (for acid reflex).    Yes Historical Provider, MD  levothyroxine (SYNTHROID, LEVOTHROID) 50 MCG tablet Take 50 mcg by mouth daily before breakfast.   Yes Historical Provider, MD  meclizine (ANTIVERT) 25 MG tablet Take 25 mg by mouth daily as  needed (for vertigo).    Yes Historical Provider, MD  psyllium (REGULOID) 0.52 G capsule Take 0.52 g by mouth daily.   Yes Historical Provider, MD  simvastatin (ZOCOR) 40 MG tablet Take 40 mg by mouth at bedtime.     Yes Historical Provider, MD  sitaGLIPtin (JANUVIA) 50 MG tablet Take 50 mg by mouth daily with breakfast.   Yes Historical Provider, MD    Inpatient medications: . aspirin  325 mg Oral Daily  . calcitRIOL  0.25 mcg Oral Daily  . cholecalciferol  1,000 Units Oral Daily  . fluticasone  1 puff Inhalation Daily  . gabapentin  300 mg Oral TID  . heparin  5,000 Units Subcutaneous 3 times per day  . insulin aspart  0-9 Units Subcutaneous TID WC  . latanoprost  1 drop Both Eyes QHS  . levothyroxine  50 mcg Oral QAC breakfast  . omega-3 acid ethyl esters  2 g Oral BID  . pantoprazole  40 mg Oral Daily  . simvastatin  40 mg Oral QHS    Discontinued Meds:   Medications Discontinued During This Encounter  Medication Reason  . amitriptyline (ELAVIL) 25 MG tablet Patient has not taken in last 30 days  . metoprolol succinate (TOPROL-XL) 25 MG 24 hr tablet Patient has not taken in last 30 days  . donepezil (ARICEPT ODT) 10 MG disintegrating tablet   . fish oil-omega-3 fatty acids capsule 2 g Formulary change  . Fluticasone Propionate (Inhal) AEPB 1 puff Formulary change  . fluticasone (FLOVENT HFA) 110 MCG/ACT inhaler 1 puff     Social History:  reports that he quit smoking about 18 years ago. His smoking use included Cigarettes. He smoked 0.00 packs per day for 35 years. He does not have any smokeless tobacco history on file. He reports that he drinks alcohol. He reports that he does not use illicit drugs.  Family History:   Family History  Problem Relation Age of Onset  . Parkinsonism Father   . Stomach cancer Mother   . Diabetes Mother     A comprehensive review of systems was negative except for: Constitutional: positive for anorexia, fatigue, malaise and weight  loss Gastrointestinal: positive for diarrhea, nausea and vomiting Genitourinary: positive for hesitancy and retention Weight change:   Intake/Output Summary (Last 24 hours) at 08/09/14 1429 Last data filed at 08/09/14 0855  Gross per 24 hour  Intake    480 ml  Output    850 ml  Net   -370 ml   BP 128/64 mmHg  Pulse 70  Temp(Src) 97.5 F (36.4 C) (Oral)  Resp 18  Ht 5\' 11"  (1.803 m)  Wt 88.6 kg (195 lb 5.2 oz)  BMI 27.25 kg/m2  SpO2 98% Filed Vitals:   08/08/14 2100 08/09/14 0547 08/09/14 0832 08/09/14 1352  BP: 123/54 116/55  128/64  Pulse: 68 69  70  Temp: 98.2 F (36.8 C) 98 F (36.7 C)  97.5 F (36.4 C)  TempSrc: Oral Oral  Oral  Resp: 16 18  18   Height:      Weight:      SpO2: 100% 96% 96% 98%     General appearance: alert, cooperative, fatigued and no distress Head: Normocephalic, without obvious abnormality, atraumatic Eyes: negative findings: lids and lashes normal, conjunctivae and sclerae normal and corneas clear Neck: no adenopathy, no carotid bruit, no JVD, supple, symmetrical, trachea midline and thyroid not enlarged, symmetric, no tenderness/mass/nodules Resp: clear to auscultation bilaterally Cardio: regular rate and rhythm and no rub GI: soft, non-tender; bowel sounds normal; no masses,  no organomegaly Extremities: edema minimal pretib edema  Labs: Basic Metabolic Panel:  Recent Labs Lab 08/08/14 1010 08/09/14 0400  NA 134* 139  K 4.8 4.4  CL 102 109  CO2 15* 16*  GLUCOSE 243* 115*  BUN 55* 58*  CREATININE 3.45* 3.48*  ALBUMIN 3.2*  --   CALCIUM 9.0 8.6   Liver Function Tests:  Recent Labs Lab 08/08/14 1010  AST 9  ALT 9  ALKPHOS 59  BILITOT 0.6  PROT 6.7  ALBUMIN 3.2*   No results for input(s): LIPASE, AMYLASE in the last 168 hours. No results for input(s): AMMONIA in the last 168 hours. CBC:  Recent Labs Lab 08/08/14 1010 08/09/14 0400  WBC 9.4 6.9  NEUTROABS 7.7  --   HGB 11.2* 9.6*  HCT 32.6* 28.5*  MCV 92.6 94.1   PLT 122* 116*   PT/INR: @LABRCNTIP (inr:5) Cardiac Enzymes: ) Recent Labs Lab 08/08/14 1010 08/09/14 1249  CKTOTAL  --  150  TROPONINI <0.30  --    CBG:  Recent Labs Lab 08/08/14 1629 08/08/14 2157 08/09/14 0750 08/09/14 1152  GLUCAP 183* 171* 127* 228*    Iron Studies: No results for input(s): IRON, TIBC, TRANSFERRIN, FERRITIN in the last 168 hours.  Xrays/Other Studies: US Renal  08/08/2014   CLINICAL DATA:  80 year old male with acute renal failure. Initial encounter. Current history of Chronic kidney disease.  EXAM: RENAL/URINARY TRACT ULTRASOUND COMPLETE  COMPARISON:  05/15/2014.  FINDINGS: Right Kidney:  Length: 9.5 cm. Unchanged upper pole simple cyst measuring up to 2.0 cm. Echogenicity Stable and within normal limits. No mass or hydronephrosis visualized.  Left Kidney:  Length: 9.7 cm. Echogenicity Stable and within normal limits. No mass or hydronephrosis visualized.  Bladder:  Appears normal for degree of bladder distention. Bilateral ureteral jets detected.  IMPRESSION: Stable sonographic appearance of the kidneys since September, no obstructive uropathy.   Electronically Signed   By: Lars Pinks M.D.   On: 08/08/2014 13:24   Dg Chest Port 1 View  08/08/2014   CLINICAL DATA:  80 year old male with fever weakness and diarrhea for 4 days. Initial encounter.  EXAM: PORTABLE CHEST - 1 VIEW  COMPARISON:  07/21/2013 and earlier.  FINDINGS: Portable AP upright view at 1018 hrs. Stable left chest single lead cardiac AICD. Sequelae of median sternotomy appears stable. Stable cardiomegaly and mediastinal contours. Lung volumes not significantly changed with no pneumothorax, pulmonary edema, pleural effusion or confluent pulmonary opacity.  IMPRESSION: No acute cardiopulmonary abnormality.   Electronically  Signed   By: Lars Pinks M.D.   On: 08/08/2014 10:29     Assessment/Plan: 1.  AKI/CKD vs progressive CKD-  He did have a presentation consistent with pre-renal/ischemic ATN,  however his Scr had been climbing 4 months prior to this admission and the only finding was urinary retention and a trabeculated bladder.  Would discontinue IVF's as he appears euvolemic and cont to follow I's/O's and daily Scr.  Renal dose medications, no indication for HD but may need further Urologic evaluation for bladder outlet issues 2. Metabolic acidosis- will add NaHCO3 tabs and follow 3. N/V/D- presumably viral gastroenteritis, r/o C. Diff per primary svc however no recurrence of diarrhea 4. ID- urine and blood cultures pending 5. Anemia- drop in Hgb likely dillutional however would recommend stool cards and follow H/H 6. SHPTH- cont with calcitriol 7. HTN- stable 8. DM- poorly controlled recently, possibly related to acute illness.  Per primary svc.   Jyron Turman A 08/09/2014, 2:29 PM

## 2014-08-09 NOTE — Evaluation (Signed)
Occupational Therapy Evaluation Patient Details Name: Patrick Boyle MRN: [id number] DOB: 1934/06/23 Today's Date: 08/09/2014    History of Present Illness pt was admitted with nausea, vomiting and diarrhea.  he has acute renal failure   Clinical Impression   This 80 year old man was admitted for the above.  At baseline, he is independent/modified independent with all adls.  Pt has not been able to eat much and feels weak.  Will continue to follow pt in acute and goals are set for supervision to min guard level.      Follow Up Recommendations  Home health OT;SNF (depending upon progress.  Pt has had decreased PO intake/wea)    Equipment Recommendations   (to be further assessed)    Recommendations for Other Services       Precautions / Restrictions Precautions Precautions: Fall Restrictions Weight Bearing Restrictions: No      Mobility Bed Mobility Overal bed mobility: Needs Assistance Bed Mobility: Supine to Sit     Supine to sit: Min guard;HOB elevated     General bed mobility comments: pt lifted HOB for a boost to get to EOB.  Reports he still feels quite weak  Transfers Overall transfer level: Needs assistance Equipment used:  (therapist in front of pt) Transfers: Sit to/from Stand Sit to Stand: Min assist         General transfer comment: sidestepped up [place] Overall balance assessment: Needs assistance Sitting-balance support: Single extremity supported;Feet supported (held onto bedrail while standing and sidestepping up [place]) Sitting balance-Leahy Scale: Poor                                      ADL Overall ADL's : Needs assistance/impaired                                       General ADL Comments: Pt felt too fatiqued to complete ADL.  Pt will need set up and extra time for UB and mod A for LB due to endurance, at this time.  Educated on energy conservation.  Pt sat at EOB x 10 minutes     Vision                      Perception     Praxis      Pertinent Vitals/Pain Pain Assessment: No/denies pain     Hand Dominance     Extremity/Trunk Assessment Upper Extremity Assessment Upper Extremity Assessment: Generalized weakness (L hand with edema)           Communication Communication Communication: No difficulties   Cognition Arousal/Alertness: Awake/alert Behavior During Therapy: WFL for tasks assessed/performed Overall Cognitive Status: Within Functional Limits for tasks assessed                     General Comments       Exercises       Shoulder Instructions      Home Living Family/patient expects to be discharged to:: Private residence Living Arrangements: Spouse/significant other                 Bathroom Shower/Tub: Occupational psychologist: Handicapped height     Home Equipment: Grab bars - toilet;Grab bars - tub/shower;Cane - single point  Prior Functioning/Environment Level of Independence: Independent;Independent with assistive device(s)        Comments: uses cane at times   also furniture walks    OT Diagnosis: Generalized weakness   OT Problem List: Decreased strength;Decreased activity tolerance;Impaired balance (sitting and/or standing);Decreased knowledge of use of DME or AE;Increased edema   OT Treatment/Interventions:      OT Goals(Current goals can be found in the care plan section) Acute Rehab OT Goals Patient Stated Goal: get stronger OT Goal Formulation: With patient Time For Goal Achievement: 08/23/14 Potential to Achieve Goals: Good ADL Goals Pt Will Perform Grooming: with supervision;standing Pt Will Transfer to Toilet: with min guard assist;ambulating;bedside commode (vs high commode) Additional ADL Goal #1: pt will complete ADL with set up/supervision sit to stand Additional ADL Goal #2: Pt will initiate rest breaks during adl routine without cues for energy conservation  OT Frequency:  Min 2X/week   Barriers to D/C:            Co-evaluation              End of Session    Activity Tolerance: Patient limited by lethargy Patient left: in bed;with call bell/phone within reach;with bed alarm set   Time: 1046-1106 OT Time Calculation (min): 20 min Charges:  OT General Charges $OT Visit: 1 Procedure OT Evaluation $Initial OT Evaluation Tier I: 1 Procedure OT Treatments $Therapeutic Activity: 8-22 mins G-Codes:    Patrick Boyle 2014-08-11, 11:29 AM Lesle Chris, OTR/L [phone] 2014-08-11

## 2014-08-09 NOTE — Progress Notes (Signed)
PROGRESS NOTE  Patrick Boyle:[id number] DOB: 1934/08/18 DOA: 08/08/2014 PCP: Abigail Miyamoto, MD  Assessment/Plan: Vomiting and diarrhea -Patient states that he has not had any vomiting or diarrhea. Approximate 48 hours -Advance diet -Stool for C. difficile PCR if diarrhea recurs -He had his vomiting and diarrhea after he took for amoxicillin for prophylaxis for a dental appointment on 08/06/2014 Acute on chronic renal failure -Patient had serum creatinine 1.30 on 11/13/2011 -wife states he had serum creatinine of 2.6 in July 2015 -Renal ultrasound negative for hydronephrosis -Likely from volume depletion in the setting of furosemide use -Check CPK -Urinalysis negative for any significant pyuria or proteinuria Fever -Check influenza PCR -Chest x-ray negative for any infiltrates  -Follow blood cultures  -Remain off antibiotics as the patient is hemodynamically stable this time  Diabetes mellitus type 2  -Hemoglobin A1c  -NovoLog sliding scale  Coronary artery disease  -Continue aspirin and statin  COPD  -Stable  -Continue Flovent     Family Communication:   Wife updated on phone 12/10 Disposition Plan:   Home when medically stable      Procedures/Studies: US Renal  08/08/2014   CLINICAL DATA:  80 year old male with acute renal failure. Initial encounter. Current history of Chronic kidney disease.  EXAM: RENAL/URINARY TRACT ULTRASOUND COMPLETE  COMPARISON:  05/15/2014.  FINDINGS: Right Kidney:  Length: 9.5 cm. Unchanged upper pole simple cyst measuring up to 2.0 cm. Echogenicity Stable and within normal limits. No mass or hydronephrosis visualized.  Left Kidney:  Length: 9.7 cm. Echogenicity Stable and within normal limits. No mass or hydronephrosis visualized.  Bladder:  Appears normal for degree of bladder distention. Bilateral ureteral jets detected.  IMPRESSION: Stable sonographic appearance of the kidneys since September, no obstructive uropathy.    Electronically Signed   By: Lars Pinks M.D.   On: 08/08/2014 13:24   Dg Chest Port 1 View  08/08/2014   CLINICAL DATA:  80 year old male with fever weakness and diarrhea for 4 days. Initial encounter.  EXAM: PORTABLE CHEST - 1 VIEW  COMPARISON:  07/21/2013 and earlier.  FINDINGS: Portable AP upright view at 1018 hrs. Stable left chest single lead cardiac AICD. Sequelae of median sternotomy appears stable. Stable cardiomegaly and mediastinal contours. Lung volumes not significantly changed with no pneumothorax, pulmonary edema, pleural effusion or confluent pulmonary opacity.  IMPRESSION: No acute cardiopulmonary abnormality.   Electronically Signed   By: Lars Pinks M.D.   On: 08/08/2014 10:29         Subjective: Patient denies any diarrhea, vomiting, abdominal pain. He denies any fevers, chills, chest pain, shortness breath, dysuria, hematuria. No rashes.   Objective: Filed Vitals:   08/08/14 1420 08/08/14 2100 08/09/14 0547 08/09/14 0832  BP: 144/68 123/54 116/55   Pulse: 71 68 69   Temp: 98.3 F (36.8 C) 98.2 F (36.8 C) 98 F (36.7 C)   TempSrc: Oral Oral Oral   Resp: 20 16 18    Height: 5\' 11"  (1.803 m)     Weight: 88.6 kg (195 lb 5.2 oz)     SpO2: 97% 100% 96% 96%    Intake/Output Summary (Last 24 hours) at 08/09/14 1153 Last data filed at 08/09/14 0855  Gross per 24 hour  Intake    480 ml  Output    850 ml  Net   -370 ml   Weight change:  Exam:   General:  Pt is alert, follows commands appropriately, not in acute distress  HEENT: No icterus, No thrush,  [place]/AT  Cardiovascular: RRR, S1/S2, no rubs, no gallops  Respiratory: CTA bilaterally, no wheezing, no crackles, no rhonchi  Abdomen: Soft/+BS, non tender, non distended, no guarding  Extremities:  Trace LEedema, No lymphangitis, No petechiae, No rashes, no synovitis  Data Reviewed: Basic Metabolic Panel:  Recent Labs Lab 08/08/14 1010 08/09/14 0400  NA 134* 139  K 4.8 4.4  CL 102 109  CO2 15* 16*    GLUCOSE 243* 115*  BUN 55* 58*  CREATININE 3.45* 3.48*  CALCIUM 9.0 8.6   Liver Function Tests:  Recent Labs Lab 08/08/14 1010  AST 9  ALT 9  ALKPHOS 59  BILITOT 0.6  PROT 6.7  ALBUMIN 3.2*   No results for input(s): LIPASE, AMYLASE in the last 168 hours. No results for input(s): AMMONIA in the last 168 hours. CBC:  Recent Labs Lab 08/08/14 1010 08/09/14 0400  WBC 9.4 6.9  NEUTROABS 7.7  --   HGB 11.2* 9.6*  HCT 32.6* 28.5*  MCV 92.6 94.1  PLT 122* 116*   Cardiac Enzymes:  Recent Labs Lab 08/08/14 1010  TROPONINI <0.30   BNP: Invalid input(s): POCBNP CBG:  Recent Labs Lab 08/08/14 1629 08/08/14 2157 08/09/14 0750  GLUCAP 183* 171* 127*    Recent Results (from the past 240 hour(s))  Culture, blood (routine x 2)     Status: None (Preliminary result)   Collection Time: 08/08/14 11:53 AM  Result Value Ref Range Status   Specimen Description BLOOD LEFT FOREARM  Final   Special Requests BOTTLES DRAWN AEROBIC AND ANAEROBIC 4CC  Final   Culture  Setup Time   Final    08/08/2014 16:05 Performed at [facility]    Culture   Final           BLOOD CULTURE RECEIVED NO GROWTH TO DATE CULTURE WILL BE HELD FOR 5 DAYS BEFORE ISSUING A FINAL NEGATIVE REPORT Performed at [facility]    Report Status PENDING  Incomplete  Culture, blood (routine x 2)     Status: None (Preliminary result)   Collection Time: 08/08/14 12:25 PM  Result Value Ref Range Status   Specimen Description BLOOD RIGHT HAND  Final   Special Requests BOTTLES DRAWN AEROBIC AND ANAEROBIC 5ML  Final   Culture  Setup Time   Final    08/08/2014 14:45 Performed at [facility]    Culture   Final           BLOOD CULTURE RECEIVED NO GROWTH TO DATE CULTURE WILL BE HELD FOR 5 DAYS BEFORE ISSUING A FINAL NEGATIVE REPORT Performed at [facility]    Report Status PENDING  Incomplete     Scheduled Meds: . aspirin  325 mg Oral Daily  . calcitRIOL  0.25 mcg Oral  Daily  . cholecalciferol  1,000 Units Oral Daily  . fluticasone  1 puff Inhalation Daily  . gabapentin  300 mg Oral TID  . heparin  5,000 Units Subcutaneous 3 times per day  . insulin aspart  0-9 Units Subcutaneous TID WC  . latanoprost  1 drop Both Eyes QHS  . levothyroxine  50 mcg Oral QAC breakfast  . omega-3 acid ethyl esters  2 g Oral BID  . pantoprazole  40 mg Oral Daily  . simvastatin  40 mg Oral QHS   Continuous Infusions: . sodium chloride 75 mL/hr at 08/09/14 0558  . sodium chloride       Shravya Wickwire, DO  [facility] Pager [phone]  If 7PM-7AM, please contact night-coverage [email or web address] Password [email or web address] 08/09/2014, 11:53 AM   LOS: 1 day

## 2014-08-10 DIAGNOSIS — I259 Chronic ischemic heart disease, unspecified: Secondary | ICD-10-CM

## 2014-08-10 LAB — BASIC METABOLIC PANEL
ANION GAP: 14 (ref 5–15)
BUN: 52 mg/dL — ABNORMAL HIGH (ref 6–23)
CALCIUM: 8.9 mg/dL (ref 8.4–10.5)
CO2: 14 mEq/L — ABNORMAL LOW (ref 19–32)
CREATININE: 3.17 mg/dL — AB (ref 0.50–1.35)
Chloride: 111 mEq/L (ref 96–112)
GFR, EST AFRICAN AMERICAN: 20 mL/min — AB (ref >90)
GFR, EST NON AFRICAN AMERICAN: 17 mL/min — AB (ref >90)
Glucose, Bld: 143 mg/dL — ABNORMAL HIGH (ref 70–99)
Potassium: 4.7 mEq/L (ref 3.7–5.3)
SODIUM: 139 meq/L (ref 137–147)

## 2014-08-10 LAB — GLUCOSE, CAPILLARY
Glucose-Capillary: 143 mg/dL — ABNORMAL HIGH (ref 70–99)
Glucose-Capillary: 223 mg/dL — ABNORMAL HIGH (ref 70–99)
Glucose-Capillary: 157 mg/dL — ABNORMAL HIGH (ref 70–99)
Glucose-Capillary: 227 mg/dL — ABNORMAL HIGH (ref 70–99)

## 2014-08-10 LAB — INFLUENZA PANEL BY PCR (TYPE A & B)
H1N1FLUPCR: NOT DETECTED
INFLAPCR: NEGATIVE
Influenza B By PCR: NEGATIVE

## 2014-08-10 MED ORDER — TAMSULOSIN HCL 0.4 MG PO CAPS
0.4000 mg | ORAL_CAPSULE | Freq: Every day | ORAL | Status: DC
  Administered 2014-08-10 – 2014-08-11 (×2): 0.4 mg via ORAL
  Filled 2014-08-10 (×3): qty 1

## 2014-08-10 NOTE — Progress Notes (Signed)
PROGRESS NOTE  Patrick Boyle BTD:[id number] DOB: 09-May-1934 DOA: 08/08/2014 PCP: Abigail Miyamoto, MD  Brief history 80 year old male with a history of diabetes mellitus, hypertension, CAD, ischemic cardiomyopathy with ICD, COPD, and CKD stage III with baseline Scr of 1.7-2.19 over the last 2 years presented with 3 week history of progressive weakness and anorexia with associated 2 days of nausea, vomiting, diarrhea. The patient stated that his vomiting and diarrhea had resolved prior to the hospitalization, but the patient continued to have generalized weakness and poor by mouth intake. The patient was found to have acute on chronic renal failure with a serum creatinine of 3.45 and he was admitted for IV fluids and further management. More recently, the patient's serum creatinine has increased to 3 over the last 4 months. He had a renal ultrasound on 05/15/2014 which revealed a trabeculated bladder suggesting possible chronic blood load obstruction. He was evaluated by urology, Dr. Diona Fanti in the outpatient setting , who felt that eventually the patient may need surgical intervention in the future.  Assessment/Plan: Vomiting and diarrhea -Patient states that he has not had any vomiting or diarrhea. Approximate 48 hours -Advance diet--presently tolerating -Stool for C. difficile PCR if diarrhea recurs which it has not -He had his vomiting and diarrhea after he took for amoxicillin for prophylaxis for a dental appointment on 08/06/2014 Acute on chronic renal failure -Patient had serum creatinine 1.30 on 11/13/2011 -wife states he had serum creatinine of 2.6 in July 2015 -Renal ultrasound negative for hydronephrosis -Likely from volume depletion in the setting of furosemide use -Check CPK--150 -Urinalysis negative for any significant pyuria or proteinuria -appreciate renal followup-->started bicarbonate -08/10/14--consult urology as there remains concern that BOO is contributing to pt's  worsening renal function -foley was placed at the time of admission-->obtain urology input if pt need to go home with the foley  Fever -Check influenza PCR--neg -Chest x-ray negative for any infiltrates  -Follow blood cultures --neg to date -Remain off antibiotics as the patient is hemodynamically stable this time  Diabetes mellitus type 2  -check Hemoglobin A1c  -NovoLog sliding scale--controlled presently  Coronary artery disease  -Continue aspirin and statin  COPD  -Stable  -Continue Flovent  Generalized weakness -Multifactorial due to the patient's acute medical condition as well as deconditioning- -PT eval  Family Communication: Wife updated on phone 12/11 Disposition Plan: Home when medically stable          Procedures/Studies: US Renal  08/08/2014   CLINICAL DATA:  80 year old male with acute renal failure. Initial encounter. Current history of Chronic kidney disease.  EXAM: RENAL/URINARY TRACT ULTRASOUND COMPLETE  COMPARISON:  05/15/2014.  FINDINGS: Right Kidney:  Length: 9.5 cm. Unchanged upper pole simple cyst measuring up to 2.0 cm. Echogenicity Stable and within normal limits. No mass or hydronephrosis visualized.  Left Kidney:  Length: 9.7 cm. Echogenicity Stable and within normal limits. No mass or hydronephrosis visualized.  Bladder:  Appears normal for degree of bladder distention. Bilateral ureteral jets detected.  IMPRESSION: Stable sonographic appearance of the kidneys since September, no obstructive uropathy.   Electronically Signed   By: Lars Pinks M.D.   On: 08/08/2014 13:24   Dg Chest Port 1 View  08/08/2014   CLINICAL DATA:  80 year old male with fever weakness and diarrhea for 4 days. Initial encounter.  EXAM: PORTABLE CHEST - 1 VIEW  COMPARISON:  07/21/2013 and earlier.  FINDINGS: Portable AP upright view at 1018 hrs. Stable left chest  single lead cardiac AICD. Sequelae of median sternotomy appears stable. Stable cardiomegaly and mediastinal  contours. Lung volumes not significantly changed with no pneumothorax, pulmonary edema, pleural effusion or confluent pulmonary opacity.  IMPRESSION: No acute cardiopulmonary abnormality.   Electronically Signed   By: Lars Pinks M.D.   On: 08/08/2014 10:29         Subjective:  patient continues to feel weak but denies any fevers, chills, chest pain, shortness breath, nausea, vomiting, diarrhea, abdominal pain, dysuria. He states that his swelling has improved.   Objective: Filed Vitals:   08/09/14 1352 08/09/14 2056 08/10/14 0611 08/10/14 0855  BP: 128/64 124/57 132/56   Pulse: 70 63 60   Temp: 97.5 F (36.4 C) 97.7 F (36.5 C) 98.2 F (36.8 C)   TempSrc: Oral Oral Oral   Resp: 18 16 18    Height:      Weight:      SpO2: 98% 97% 96% 95%    Intake/Output Summary (Last 24 hours) at 08/10/14 1145 Last data filed at 08/10/14 0952  Gross per 24 hour  Intake    240 ml  Output   1700 ml  Net  -1460 ml   Weight change:  Exam:   General:  Pt is alert, follows co [place]/AT  Cardiovascular: RRR, S1/S2, no rubs, no gallops  Respiratory: CTA bilaterally, no wheezing, no crackles, no rhonchi  Abdomen: Soft/+BS, non tender, non distended, no guarding  Extremities: No edema, No lymphangitis, No petechiae, No rashes, no synovitis  Data Reviewed: Basic Metabolic Panel:  Recent Labs Lab 08/08/14 1010 08/09/14 0400 08/10/14 0420  NA 134* 139 139  K 4.8 4.4 4.7  CL 102 109 111  CO2 15* 16* 14*  GLUCOSE 243* 115* 143*  BUN 55* 58* 52*  CREATININE 3.45* 3.48* 3.17*  CALCIUM 9.0 8.6 8.9   Liver Function Tests:  Recent Labs Lab 08/08/14 1010  AST 9  ALT 9  ALKPHOS 59  BILITOT 0.6  PROT 6.7  ALBUMIN 3.2*   No results for input(s): LIPASE, AMYLASE in the last 168 hours. No results for input(s): AMMONIA in the last 168 hours. CBC:  Recent Labs Lab 08/08/14 1010 08/09/14 0400  WBC 9.4 6.9  NEUTROABS 7.7  --   HGB 11.2* 9.6*  HCT 32.6* 28.5*  MCV 92.6 94.1  PLT  122* 116*   Cardiac Enzymes:  Recent Labs Lab 08/08/14 1010 08/09/14 1249  CKTOTAL  --  150  TROPONINI <0.30  --    BNP: Invalid input(s): POCBNP CBG:  Recent Labs Lab 08/09/14 0750 08/09/14 1152 08/09/14 1635 08/09/14 2117 08/10/14 0748  GLUCAP 127* 228* 113* 132* 143*    Recent Results (from the past 240 hour(s))  Urine culture     Status: None   Collection Time: 08/08/14 11:14 AM  Result Value Ref Range Status   Specimen Description URINE, CATHETERIZED  Final   Special Requests NONE  Final   Culture  Setup Time   Final    08/08/2014 15:14 Performed at [place] Performed at [facility]   Final   Culture NO GROWTH Performed at [facility]   Final   Report Status 08/09/2014 FINAL  Final  Culture, blood (routine x 2)     Status: None (Preliminary result)   Collection Time: 08/08/14 11:53 AM  Result Value Ref Range Status   Specimen Description BLOOD LEFT FOREARM  Final   Special Requests BOTTLES DRAWN AEROBIC AND  ANAEROBIC 4CC  Final   Culture  Setup Time   Final    08/08/2014 16:05 Performed at [facility]    Culture   Final           BLOOD CULTURE RECEIVED NO GROWTH TO DATE CULTURE WILL BE HELD FOR 5 DAYS BEFORE ISSUING A FINAL NEGATIVE REPORT Performed at [facility]    Report Status PENDING  Incomplete  Culture, blood (routine x 2)     Status: None (Preliminary result)   Collection Time: 08/08/14 12:25 PM  Result Value Ref Range Status   Specimen Description BLOOD RIGHT HAND  Final   Special Requests BOTTLES DRAWN AEROBIC AND ANAEROBIC 5ML  Final   Culture  Setup Time   Final    08/08/2014 14:45 Performed at [facility]    Culture   Final           BLOOD CULTURE RECEIVED NO GROWTH TO DATE CULTURE WILL BE HELD FOR 5 DAYS BEFORE ISSUING A FINAL NEGATIVE REPORT Performed at [facility]    Report Status PENDING  Incomplete     Scheduled Meds: . aspirin   325 mg Oral Daily  . calcitRIOL  0.25 mcg Oral Daily  . cholecalciferol  1,000 Units Oral Daily  . fluticasone  1 puff Inhalation Daily  . gabapentin  300 mg Oral TID  . heparin  5,000 Units Subcutaneous 3 times per day  . insulin aspart  0-9 Units Subcutaneous TID WC  . latanoprost  1 drop Both Eyes QHS  . levothyroxine  50 mcg Oral QAC breakfast  . omega-3 acid ethyl esters  2 g Oral BID  . pantoprazole  40 mg Oral Daily  . simvastatin  40 mg Oral QHS  . sodium bicarbonate  650 mg Oral BID   Continuous Infusions:    Shepherd Finnan, DO  [facility] Pager [phone]  If 7PM-7AM, please contact night-coverage [email or web address] Password [email or web address] 08/10/2014, 11:45 AM   LOS: 2 days

## 2014-08-10 NOTE — Progress Notes (Signed)
Physical Therapy Treatment Patient Details Name: Madsen Riddle MRN: [id number] DOB: 1934-05-21 Today's Date: 09/05/14    History of Present Illness pt was admitted with nausea, vomiting and diarrhea.  he has acute renal failure    PT Comments    Pt better today, incr activity tolerance,  incr gait distance;   Follow Up Recommendations  Home health PT     Equipment Recommendations  None recommended by PT    Recommendations for Other Services       Precautions / Restrictions Precautions Precautions: Fall Restrictions Weight Bearing Restrictions: No    Mobility  Bed Mobility Overal bed mobility: Needs Assistance Bed Mobility: Supine to Sit;Sit to Supine     Supine to sit: Supervision Sit to supine: Supervision   General bed mobility comments: incr time  Transfers Overall transfer level: Needs assistance Equipment used: Rolling walker (2 wheeled);None Transfers: Sit to/from Stand Sit to Stand: Supervision         General transfer comment: VCs for safe technique, hand placement  Ambulation/Gait Ambulation/Gait assistance: Supervision Ambulation Distance (Feet): 140 Feet Assistive device: Rolling walker (2 wheeled) Gait Pattern/deviations: Step-through pattern;Decreased stride length     General Gait Details: improved wt shift, more steady today,  pt attempting to amb without RW for a few steps (was beginning to carry walker)   Stairs            Wheelchair Mobility    Modified Rankin (Stroke Patients Only)       Balance                                    Cognition Arousal/Alertness: Awake/alert Behavior During Therapy: WFL for tasks assessed/performed Overall Cognitive Status: Within Functional Limits for tasks assessed                      Exercises      General Comments        Pertinent Vitals/Pain Pain Assessment: No/denies pain    Home Living                      Prior Function             PT Goals (current goals can now be found in the care plan section) Acute Rehab PT Goals Patient Stated Goal: get stronger PT Goal Formulation: With patient Time For Goal Achievement: 08/16/14 Potential to Achieve Goals: Good Progress towards PT goals: Progressing toward goals    Frequency  Min 3X/week    PT Plan Current plan remains appropriate    Co-evaluation             End of Session Equipment Utilized During Treatment: Gait belt Activity Tolerance: Patient tolerated treatment well Patient left: with call bell/phone within reach;in bed     Time: 1208-1225 PT Time Calculation (min) (ACUTE ONLY): 17 min  Charges:  $Gait Training: 8-22 mins                    G Codes:      Olander Friedl 09-05-2014, 12:52 PM

## 2014-08-10 NOTE — Consult Note (Signed)
Urology Consult   Physician requesting consult: Orson Eva, MD  Reason for consult: Urinary Retention  History of Present Illness: Patrick Boyle is a 80 y.o. male with multiple medical problems including DM, HTN, CAD and CKD stage 3 who is currently admitted for weakness/anorexia and N/V.  As part of his workup, he was found to have an elevated creatinine to 2.45 from a baseline of 1.7-2.19 over the last 2 years  He has been seen by Dr. Beatrix Fetters this past November and was evaluated for LUTS and noted to have a post void residual of around 352ml.  At that time, he described his urinary symptoms as the following: "The patient Lower urinary tract symptomatology that mild frequency, nocturia 1, weak stream and mild intermittency. AUA BPH symptom score is 10, however with his quality of life index 2. He has not been on any medical therapy for his prostate. He denies any surgery on his prostate. He has not had any urinary tract infections or prior urologic interventions."  During his last visit, he also underwent a DRE which illustrated an approximate 30g gland that was smooth and non-tender.  At that time, there was discussion of medical vs. Surgical management of his likely bladder outlet obstruction but the patient desired surveillance instead.  Since admission, a foley catheter was placed and his creatinine has trended down from 3.48 to 3.17 over the past 24 hours.  A renal U/S was also performed during this admission which showed a stable 2cm simple cyst in the right kidney and otherwise normal appearing kidneys.  Bladder volume was not recorded.  He reports that his urinary symptoms have not changed over the past few months.  He is open to trying an oral medication for his likely bladder outlet obstruction.   Trend in Creatinine: CREATININE, SER  Date/Time Value Ref Range Status  08/09/2014 04:00 AM 3.48* 0.50 - 1.35 mg/dL Final  08/08/2014 10:10 AM 3.45* 0.50 - 1.35 mg/dL Final   06/11/2014 2.67 0.76-1.27 mg/dL Final  05/10/2014 3.19 0.76-1.27 mg/dL Final  04/18/2014 3.02 0.76-1.27 mg/dL Final  12/13/2013 1.89 0.76-1.27 mg/dL Final  11/13/2011 01:20 PM 1.30 0.50 - 1.35 mg/dL Final  06/11/2011 09:11 AM 1.65* 0.50 - 1.35 mg/dL Final  06/04/2011 10:10 AM 1.9* 0.4 - 1.5 mg/dL Final  09/02/2009 04:10 PM 1.5 0.4-1.5 mg/dL Final     Denies a history of voiding or storage urinary symptoms, hematuria, UTIs, STDs, urolithiasis, GU malignancy/trauma/surgery.  Past Medical History  Diagnosis Date  . Diabetes mellitus     dr Olen Pel  . Hyperlipidemia   . Hypertension   . Coronary artery disease     s/p CABG 1997  . GERD (gastroesophageal reflux disease)   . Arthritis   . Allergic rhinitis   . Hyperplastic colon polyp   . Diverticulitis, colon   . Meniere's syndrome   . Tremor, essential     dr Jannifer Franklin  . COPD, mild     dr clance  . MI (myocardial infarction) 1997, 2005  . Ischemic cardiomyopathy     sp ICD (SJM) implanted by Dr Rayann Heman    Past Surgical History  Procedure Laterality Date  . Coronary artery bypass graft  [facility]  . Cardiac catheterization  2005    medical therapy  . Cardiac defibrillator placement      ICD implanted by Dr Rayann Heman, Analyze ST study patient    Medications:  Home meds:    Medication List    ASK your doctor about  these medications        ARTHRITIS PAIN RELIEF PO  Take 650 mg by mouth 2 (two) times daily.     aspirin 325 MG tablet  Take 325 mg by mouth daily.     bimatoprost 0.03 % ophthalmic solution  Commonly known as:  LUMIGAN  Place 1 drop into both eyes at bedtime.     calcitRIOL 0.25 MCG capsule  Commonly known as:  ROCALTROL  Take 0.25 mcg by mouth daily.     cetirizine 10 MG tablet  Commonly known as:  ZYRTEC  Take 5 mg by mouth daily.     cholecalciferol 1000 UNITS tablet  Commonly known as:  VITAMIN D  Take 1,000 Units by mouth daily.     donepezil 10 MG tablet   Commonly known as:  ARICEPT  Take 10 mg by mouth at bedtime.     fish oil-omega-3 fatty acids 1000 MG capsule  Take 2 g by mouth 2 (two) times daily.     FLOVENT DISKUS 100 MCG/BLIST Aepb  Generic drug:  Fluticasone Propionate (Inhal)  Inhale 1 puff into the lungs daily.     furosemide 20 MG tablet  Commonly known as:  LASIX  Take 20 mg by mouth every other day.     gabapentin 300 MG capsule  Commonly known as:  NEURONTIN  Take 300 mg by mouth 3 (three) times daily.     glimepiride 4 MG tablet  Commonly known as:  AMARYL  Take 4 mg by mouth 2 (two) times daily.     lansoprazole 30 MG capsule  Commonly known as:  PREVACID  Take 30 mg by mouth daily as needed (for acid reflex).     levothyroxine 50 MCG tablet  Commonly known as:  SYNTHROID, LEVOTHROID  Take 50 mcg by mouth daily before breakfast.     meclizine 25 MG tablet  Commonly known as:  ANTIVERT  Take 25 mg by mouth daily as needed (for vertigo).     psyllium 0.52 G capsule  Commonly known as:  REGULOID  Take 0.52 g by mouth daily.     simvastatin 40 MG tablet  Commonly known as:  ZOCOR  Take 40 mg by mouth at bedtime.     sitaGLIPtin 50 MG tablet  Commonly known as:  JANUVIA  Take 50 mg by mouth daily with breakfast.        Scheduled Meds: . aspirin  325 mg Oral Daily  . calcitRIOL  0.25 mcg Oral Daily  . cholecalciferol  1,000 Units Oral Daily  . fluticasone  1 puff Inhalation Daily  . gabapentin  300 mg Oral TID  . heparin  5,000 Units Subcutaneous 3 times per day  . insulin aspart  0-9 Units Subcutaneous TID WC  . latanoprost  1 drop Both Eyes QHS  . levothyroxine  50 mcg Oral QAC breakfast  . omega-3 acid ethyl esters  2 g Oral BID  . pantoprazole  40 mg Oral Daily  . simvastatin  40 mg Oral QHS  . sodium bicarbonate  650 mg Oral BID   Continuous Infusions:  PRN Meds:.acetaminophen **OR** acetaminophen, albuterol, ondansetron **OR** ondansetron (ZOFRAN) IV  Allergies:  Allergies   Allergen Reactions  . Metformin And Related Other (See Comments)    GI sensitivity with high doses  . Ramipril Other (See Comments)    Cough    Family History  Problem Relation Age of Onset  . Parkinsonism Father   . Stomach cancer Mother   .  Diabetes Mother     Social History:  reports that he quit smoking about 18 years ago. His smoking use included Cigarettes. He smoked 0.00 packs per day for 35 years. He does not have any smokeless tobacco history on file. He reports that he drinks alcohol. He reports that he does not use illicit drugs.  ROS: A complete review of systems was performed.  All systems are negative except for pertinent findings as noted.  Physical Exam:  Vital signs in last 24 hours: Temp:  [97.7 F (36.5 C)-98.2 F (36.8 C)] 98.2 F (36.8 C) (12/11 0611) Pulse Rate:  [60-63] 60 (12/11 0611) Resp:  [16-18] 18 (12/11 0611) BP: (124-132)/(56-57) 132/56 mmHg (12/11 0611) SpO2:  [95 %-97 %] 95 % (12/11 0855) Constitutional:  Alert and oriented, No acute distress Cardiovascular: Regular rate and rhythm, No JVD Respiratory: Normal respiratory effort, Lungs clear bilaterally GI: Abdomen is soft, nontender, nondistended, no abdominal masses Genitourinary: No CVAT. Normal male phallus, testes are descended bilaterally and non-tender and without masses, scrotum is normal in appearance without lesions or masses, perineum is normal on inspection. Lymphatic: No lymphadenopathy Neurologic: Grossly intact, no focal deficits Psychiatric: Normal mood and affect  Laboratory Data:   Recent Labs  08/08/14 1010 08/09/14 0400  WBC 9.4 6.9  HGB 11.2* 9.6*  HCT 32.6* 28.5*  PLT 122* 116*     Recent Labs  08/08/14 1010 08/09/14 0400 08/10/14 0420  NA 134* 139 139  K 4.8 4.4 4.7  CL 102 109 111  GLUCOSE 243* 115* 143*  BUN 55* 58* 52*  CALCIUM 9.0 8.6 8.9  CREATININE 3.45* 3.48* 3.17*     Results for orders placed or performed during the hospital encounter  of 08/08/14 (from the past 24 hour(s))  Glucose, capillary     Status: Abnormal   Collection Time: 08/09/14  4:35 PM  Result Value Ref Range   Glucose-Capillary 113 (H) 70 - 99 mg/dL  Influenza panel by PCR (type A & B, H1N1)     Status: None   Collection Time: 08/09/14  8:05 PM  Result Value Ref Range   Influenza A By PCR NEGATIVE NEGATIVE   Influenza B By PCR NEGATIVE NEGATIVE   H1N1 flu by pcr NOT DETECTED NOT DETECTED  Glucose, capillary     Status: Abnormal   Collection Time: 08/09/14  9:17 PM  Result Value Ref Range   Glucose-Capillary 132 (H) 70 - 99 mg/dL  Basic metabolic panel     Status: Abnormal   Collection Time: 08/10/14  4:20 AM  Result Value Ref Range   Sodium 139 137 - 147 mEq/L   Potassium 4.7 3.7 - 5.3 mEq/L   Chloride 111 96 - 112 mEq/L   CO2 14 (L) 19 - 32 mEq/L   Glucose, Bld 143 (H) 70 - 99 mg/dL   BUN 52 (H) 6 - 23 mg/dL   Creatinine, Ser 3.17 (H) 0.50 - 1.35 mg/dL   Calcium 8.9 8.4 - 10.5 mg/dL   GFR calc non Af Amer 17 (L) >90 mL/min   GFR calc Af Amer 20 (L) >90 mL/min   Anion gap 14 5 - 15  Glucose, capillary     Status: Abnormal   Collection Time: 08/10/14  7:48 AM  Result Value Ref Range   Glucose-Capillary 143 (H) 70 - 99 mg/dL  Glucose, capillary     Status: Abnormal   Collection Time: 08/10/14 12:02 PM  Result Value Ref Range   Glucose-Capillary 223 (H) 70 -  99 mg/dL   Recent Results (from the past 240 hour(s))  Urine culture     Status: None   Collection Time: 08/08/14 11:14 AM  Result Value Ref Range Status   Specimen Description URINE, CATHETERIZED  Final   Special Requests NONE  Final   Culture  Setup Time   Final    08/08/2014 15:14 Performed at [facility] Performed at [facility]   Final   Culture NO GROWTH Performed at [facility]   Final   Report Status 08/09/2014 FINAL  Final  Culture, blood (routine x 2)     Status: None (Preliminary result)   Collection Time:  08/08/14 11:53 AM  Result Value Ref Range Status   Specimen Description BLOOD LEFT FOREARM  Final   Special Requests BOTTLES DRAWN AEROBIC AND ANAEROBIC 4CC  Final   Culture  Setup Time   Final    08/08/2014 16:05 Performed at [facility]    Culture   Final           BLOOD CULTURE RECEIVED NO GROWTH TO DATE CULTURE WILL BE HELD FOR 5 DAYS BEFORE ISSUING A FINAL NEGATIVE REPORT Performed at [facility]    Report Status PENDING  Incomplete  Culture, blood (routine x 2)     Status: None (Preliminary result)   Collection Time: 08/08/14 12:25 PM  Result Value Ref Range Status   Specimen Description BLOOD RIGHT HAND  Final   Special Requests BOTTLES DRAWN AEROBIC AND ANAEROBIC 5ML  Final   Culture  Setup Time   Final    08/08/2014 14:45 Performed at [facility]    Culture   Final           BLOOD CULTURE RECEIVED NO GROWTH TO DATE CULTURE WILL BE HELD FOR 5 DAYS BEFORE ISSUING A FINAL NEGATIVE REPORT Performed at [facility]    Report Status PENDING  Incomplete    Renal Function:  Recent Labs  08/08/14 1010 08/09/14 0400 08/10/14 0420  CREATININE 3.45* 3.48* 3.17*   Estimated Creatinine Clearance: 20.5 mL/min (by C-G formula based on Cr of 3.17).  Radiologic Imaging: No results found.  I independently reviewed the above imaging studies.  Impression/Recommendation 80 yo with multiple medical problems who was recently found to have acute on chronic renal failure in the setting of known incomplete emptying of his bladder but an ultrasound showing no hydronephrosis.  Since catheter was placed two days ago, his creatinine has been improving - notclear how much of this improvement is due to bladder decompression vs. other medical management.  Recommendations:  1) Continue foley catheter 2) Trend creatinine and medical management per primary team and nephrology consultation 3) Start flomax 0.4mg  qday 4) Return to clinic with Dr. Diona Fanti in  7 days for trial of void   Please contact urologist on call with any questions

## 2014-08-10 NOTE — Progress Notes (Signed)
Assessment/Plan: 1. AKI/CKD vs progressive CKD- He has significant BOO symptoms, prob obstructive etiology s/p foley                                                                   Plan: He need urology follow up 2. Metabolic acidosis- will add NaHCO3 tabs and follow 3. ID- urine and blood cultures pending 4. Anemia- drop   Subjective: Interval History: Reports marked hesitancy  Objective: Vital signs in last 24 hours: Temp:  [97.5 F (36.4 C)-98.2 F (36.8 C)] 98.2 F (36.8 C) (12/11 0611) Pulse Rate:  [60-70] 60 (12/11 0611) Resp:  [16-18] 18 (12/11 0611) BP: (124-132)/(56-64) 132/56 mmHg (12/11 0611) SpO2:  [96 %-98 %] 96 % (12/11 0611) Weight change:   Intake/Output from previous day: 12/10 0701 - 12/11 0700 In: 240 [P.O.:240] Out: 1400 [Urine:1400] Intake/Output this shift:    General appearance: alert, cooperative and appears stated age Resp: clear to auscultation bilaterally Chest wall: no tenderness Cardio: regular rate and rhythm, S1, S2 normal, no murmur, click, rub or gallop Extremities: edema tr  Lab Results:  Recent Labs  08/08/14 1010 08/09/14 0400  WBC 9.4 6.9  HGB 11.2* 9.6*  HCT 32.6* 28.5*  PLT 122* 116*   BMET:  Recent Labs  08/09/14 0400 08/10/14 0420  NA 139 139  K 4.4 4.7  CL 109 111  CO2 16* 14*  GLUCOSE 115* 143*  BUN 58* 52*  CREATININE 3.48* 3.17*  CALCIUM 8.6 8.9   No results for input(s): PTH in the last 72 hours. Iron Studies: No results for input(s): IRON, TIBC, TRANSFERRIN, FERRITIN in the last 72 hours. Studies/Results: US Renal  08/08/2014   CLINICAL DATA:  80 year old male with acute renal failure. Initial encounter. Current history of Chronic kidney disease.  EXAM: RENAL/URINARY TRACT ULTRASOUND COMPLETE  COMPARISON:  05/15/2014.  FINDINGS: Right Kidney:  Length: 9.5 cm. Unchanged upper pole simple cyst measuring up to 2.0 cm. Echogenicity Stable and within normal limits. No mass or hydronephrosis visualized.  Left  Kidney:  Length: 9.7 cm. Echogenicity Stable and within normal limits. No mass or hydronephrosis visualized.  Bladder:  Appears normal for degree of bladder distention. Bilateral ureteral jets detected.  IMPRESSION: Stable sonographic appearance of the kidneys since September, no obstructive uropathy.   Electronically Signed   By: Lars Pinks M.D.   On: 08/08/2014 13:24   Dg Chest Port 1 View  08/08/2014   CLINICAL DATA:  80 year old male with fever weakness and diarrhea for 4 days. Initial encounter.  EXAM: PORTABLE CHEST - 1 VIEW  COMPARISON:  07/21/2013 and earlier.  FINDINGS: Portable AP upright view at 1018 hrs. Stable left chest single lead cardiac AICD. Sequelae of median sternotomy appears stable. Stable cardiomegaly and mediastinal contours. Lung volumes not significantly changed with no pneumothorax, pulmonary edema, pleural effusion or confluent pulmonary opacity.  IMPRESSION: No acute cardiopulmonary abnormality.   Electronically Signed   By: Lars Pinks M.D.   On: 08/08/2014 10:29   Scheduled: . aspirin  325 mg Oral Daily  . calcitRIOL  0.25 mcg Oral Daily  . cholecalciferol  1,000 Units Oral Daily  . fluticasone  1 puff Inhalation Daily  . gabapentin  300 mg Oral TID  . heparin  5,000 Units Subcutaneous  3 times per day  . insulin aspart  0-9 Units Subcutaneous TID WC  . latanoprost  1 drop Both Eyes QHS  . levothyroxine  50 mcg Oral QAC breakfast  . omega-3 acid ethyl esters  2 g Oral BID  . pantoprazole  40 mg Oral Daily  . simvastatin  40 mg Oral QHS  . sodium bicarbonate  650 mg Oral BID     LOS: 2 days   Hero Mccathern C 08/10/2014,8:39 AM

## 2014-08-10 NOTE — Progress Notes (Signed)
OT Cancellation Note  Patient Details Name: Patrick Boyle MRN: [id number] DOB: [birth year]   Cancelled Treatment:    Reason Eval/Treat Not Completed: Fatigue/lethargy limiting ability to participate (pt reports he has had "13 people bothering me" this morning. Not willing to work with OT at this time.)  Islay Polanco A 08/10/2014, 8:26 AM

## 2014-08-11 LAB — CBC
HEMATOCRIT: 29.2 % — AB (ref 39.0–52.0)
HEMOGLOBIN: 9.7 g/dL — AB (ref 13.0–17.0)
MCH: 31.2 pg (ref 26.0–34.0)
MCHC: 33.2 g/dL (ref 30.0–36.0)
MCV: 93.9 fL (ref 78.0–100.0)
Platelets: 138 10*3/uL — ABNORMAL LOW (ref 150–400)
RBC: 3.11 MIL/uL — ABNORMAL LOW (ref 4.22–5.81)
RDW: 12.7 % (ref 11.5–15.5)
WBC: 5.8 10*3/uL (ref 4.0–10.5)

## 2014-08-11 LAB — GLUCOSE, CAPILLARY
GLUCOSE-CAPILLARY: 200 mg/dL — AB (ref 70–99)
GLUCOSE-CAPILLARY: 268 mg/dL — AB (ref 70–99)
GLUCOSE-CAPILLARY: 210 mg/dL — AB (ref 70–99)
Glucose-Capillary: 137 mg/dL — ABNORMAL HIGH (ref 70–99)

## 2014-08-11 LAB — RENAL FUNCTION PANEL
ANION GAP: 15 (ref 5–15)
Albumin: 2.7 g/dL — ABNORMAL LOW (ref 3.5–5.2)
BUN: 50 mg/dL — AB (ref 6–23)
CO2: 16 mEq/L — ABNORMAL LOW (ref 19–32)
CREATININE: 2.93 mg/dL — AB (ref 0.50–1.35)
Calcium: 9 mg/dL (ref 8.4–10.5)
Chloride: 110 mEq/L (ref 96–112)
GFR calc Af Amer: 22 mL/min — ABNORMAL LOW (ref >90)
GFR calc non Af Amer: 19 mL/min — ABNORMAL LOW (ref >90)
Glucose, Bld: 193 mg/dL — ABNORMAL HIGH (ref 70–99)
PHOSPHORUS: 3.6 mg/dL (ref 2.3–4.6)
Potassium: 4.9 mEq/L (ref 3.7–5.3)
Sodium: 141 mEq/L (ref 137–147)

## 2014-08-11 MED ORDER — GABAPENTIN 300 MG PO CAPS
300.0000 mg | ORAL_CAPSULE | Freq: Two times a day (BID) | ORAL | Status: DC
  Administered 2014-08-12: 300 mg via ORAL
  Filled 2014-08-11 (×2): qty 1

## 2014-08-11 MED ORDER — GABAPENTIN 300 MG PO CAPS: 300.0000 mg | ORAL_CAPSULE | Freq: Every day | ORAL | Status: DC

## 2014-08-11 NOTE — Discharge Summary (Signed)
Physician Discharge Summary  Patrick Boyle GUR:[id number] DOB: 04-26-1934 DOA: 08/08/2014  PCP: Abigail Miyamoto, MD  Admit date: 08/08/2014 Discharge date: 08/12/2014  Recommendations for Outpatient Follow-up:  1. Pt will need to follow up with PCP in 2 weeks post discharge 2. Please obtain BMP 08/14/14 at Nephrology office--Dr. Florene Glen to arrange 3. Follow up on HbA1c results  Discharge Diagnoses:  Vomiting and diarrhea -Patient states that he has not had any vomiting or diarrhea. Approximate 48 hours -Advance diet--presently tolerating -Stool for C. difficile PCR if diarrhea recurs which it has not -He had his vomiting and diarrhea after he took for amoxicillin for prophylaxis for a dental appointment on 08/06/2014 Acute on chronic renal failure -Patient had serum creatinine 1.30 on 11/13/2011 -wife states he had serum creatinine of 2.6 in July 2015 -Renal ultrasound negative for hydronephrosis -Likely from volume depletion in the setting of furosemide use and vomiting with component of BOO -Check CPK--150 -Urinalysis negative for any significant pyuria or proteinuria -appreciate renal followup-->started bicarbonate 650mg  bid -08/10/14--appreciate urology input-->plan to go home with foley and f/u with Dr. Diona Fanti in 1 week -home health set up to help manage foley and PT -Serum creatinine 2.8 on the day of discharge Fever -resolved -Check influenza PCR--neg -Chest x-ray negative for any infiltrates  -Follow blood cultures --neg to date -Remain off antibiotics as the patient is hemodynamically stable this time  Diabetes mellitus type 2  -check Hemoglobin A1c--pending at time of d/c -NovoLog sliding scale -d/c home on his usual amaryl and Januvia Coronary artery disease  -Continue aspirin and statin  COPD  -Stable  -Continue Flovent  Generalized weakness -Multifactorial due to the patient's acute medical condition as well as deconditioning- -PT  eval-->HHPT  Discharge Condition: stable  Disposition:  Follow-up Information    Follow up with [facility].   Why:  Home Health Physical Therapy   Contact information:   [place] [address] [phone]       Follow up with Jorja Loa, MD In 1 week.   Specialty:  Urology   Contact information:   [place] [address] [phone]       Follow up with DUNHAM,CYNTHIA B, MD In 2 weeks.   Specialty:  Nephrology   Contact information:   [place] [address] [phone]     home  Diet:carbohydrate modified Wt Readings from Last 3 Encounters:  08/08/14 88.6 kg (195 lb 5.2 oz)  02/22/14 84.823 kg (187 lb)  09/20/13 87.091 kg (192 lb)    History of present illness:  80 year old male with a history of diabetes mellitus, hypertension, CAD, ischemic cardiomyopathy with ICD, COPD, and CKD stage III with baseline Scr of 1.7-2.19 over the last 2 years presented with 3 week history of progressive weakness and anorexia with associated 2 days of nausea, vomiting, diarrhea. The patient stated that his vomiting and diarrhea had resolved prior to the hospitalization, but the patient continued to have generalized weakness and poor by mouth intake. The patient was found to have acute on chronic renal failure with a serum creatinine of 3.45 and he was admitted for IV fluids and further management. More recently, the patient's serum creatinine has increased to 3 over the last 4 months. He had a renal ultrasound on 05/15/2014 which revealed a trabeculated bladder suggesting possible chronic blood load obstruction. He was evaluated by urology, Dr. Diona Fanti in the outpatient setting , who felt that eventually the patient may  need surgical intervention in the future. Repeat renal ultrasound on 08/08/2014 was negative for hydronephrosis but did show bilateral ureteral jets. At the time of admission, the patient was noted to  have a fever of 101.80F. Blood cultures were obtained and were negative. Urine culture was negative. Influenza was negative. The patient defervesced and remained hemodynamically stable off of antibiotics.  Nephrology was consulted. The patient was started on oral bicarbonate due to his metabolic acidosis. Urology was consulted due to concerns of contribution of his bladder outlet obstruction due to his worsening renal failure. A Foley catheter was placed. Urology recommended discharging the patient with a Foley catheter and follow up with Dr. Diona Fanti in one week. After the Foley catheter was placed and the patient was initially hydrated, the patient's renal function gradually improved throughout the hospitalization.   Consultants: Nephrology Urology  Discharge Exam: Filed Vitals:   08/12/14 0544  BP: 139/63  Pulse: 63  Temp: 98.1 F (36.7 C)  Resp: 18   Filed Vitals:   08/11/14 1814 08/11/14 2115 08/12/14 0544 08/12/14 1046  BP: 136/60 123/57 139/63   Pulse: 58 60 63   Temp: 97.8 F (36.6 C) 97.8 F (36.6 C) 98.1 F (36.7 C)   TempSrc: Oral Oral Oral   Resp: 18 18 18    Height:      Weight:      SpO2: 97% 98% 95% 98%   General: A&O x 3, NAD, pleasant, cooperative Cardiovascular: RRR, no rub, no gallop, no S3 Respiratory: CTAB, no wheeze, no rhonchi Abdomen:soft, nontender, nondistended, positive bowel sounds Extremities: trace LE edema, No lymphangitis, no petechiae  Discharge Instructions      Discharge Instructions    Diet - low sodium heart healthy    Complete by:  As directed      Discharge instructions    Complete by:  As directed   Do not restart lasix until instructed by your kidney doctor, Dr. Lorrene Reid Call Dr. Zannie Cove for appointment tomorrow     Increase activity slowly    Complete by:  As directed             Medication List    TAKE these medications        ARTHRITIS PAIN RELIEF PO  Take 650 mg by mouth 2 (two) times daily.     aspirin 325 MG  tablet  Take 325 mg by mouth daily.     bimatoprost 0.03 % ophthalmic solution  Commonly known as:  LUMIGAN  Place 1 drop into both eyes at bedtime.     calcitRIOL 0.25 MCG capsule  Commonly known as:  ROCALTROL  Take 0.25 mcg by mouth daily.     cetirizine 10 MG tablet  Commonly known as:  ZYRTEC  Take 5 mg by mouth daily.     cholecalciferol 1000 UNITS tablet  Commonly known as:  VITAMIN D  Take 1,000 Units by mouth daily.     donepezil 10 MG tablet  Commonly known as:  ARICEPT  Take 10 mg by mouth at bedtime.     fish oil-omega-3 fatty acids 1000 MG capsule  Take 2 g by mouth 2 (two) times daily.     FLOVENT DISKUS 100 MCG/BLIST Aepb  Generic drug:  Fluticasone Propionate (Inhal)  Inhale 1 puff into the lungs daily.     furosemide 20 MG tablet  Commonly known as:  LASIX  Take 20 mg by mouth every other day.     gabapentin 300 MG capsule  Commonly known  as:  NEURONTIN  Take 1 capsule (300 mg total) by mouth 2 (two) times daily.     glimepiride 4 MG tablet  Commonly known as:  AMARYL  Take 4 mg by mouth 2 (two) times daily.     lansoprazole 30 MG capsule  Commonly known as:  PREVACID  Take 30 mg by mouth daily as needed (for acid reflex).     levothyroxine 50 MCG tablet  Commonly known as:  SYNTHROID, LEVOTHROID  Take 50 mcg by mouth daily before breakfast.     meclizine 25 MG tablet  Commonly known as:  ANTIVERT  Take 25 mg by mouth daily as needed (for vertigo).     psyllium 0.52 G capsule  Commonly known as:  REGULOID  Take 0.52 g by mouth daily.     simvastatin 40 MG tablet  Commonly known as:  ZOCOR  Take 40 mg by mouth at bedtime.     sitaGLIPtin 50 MG tablet  Commonly known as:  JANUVIA  Take 50 mg by mouth daily with breakfast.     sodium bicarbonate 650 MG tablet  Take 1 tablet (650 mg total) by mouth 2 (two) times daily.     tamsulosin 0.4 MG Caps capsule  Commonly known as:  FLOMAX  Take 1 capsule (0.4 mg total) by mouth daily after  supper.         The results of significant diagnostics from this hospitalization (including imaging, microbiology, ancillary and laboratory) are listed below for reference.    Significant Diagnostic Studies: US Renal  08/08/2014   CLINICAL DATA:  80 year old male with acute renal failure. Initial encounter. Current history of Chronic kidney disease.  EXAM: RENAL/URINARY TRACT ULTRASOUND COMPLETE  COMPARISON:  05/15/2014.  FINDINGS: Right Kidney:  Length: 9.5 cm. Unchanged upper pole simple cyst measuring up to 2.0 cm. Echogenicity Stable and within normal limits. No mass or hydronephrosis visualized.  Left Kidney:  Length: 9.7 cm. Echogenicity Stable and within normal limits. No mass or hydronephrosis visualized.  Bladder:  Appears normal for degree of bladder distention. Bilateral ureteral jets detected.  IMPRESSION: Stable sonographic appearance of the kidneys since September, no obstructive uropathy.   Electronically Signed   By: Lars Pinks M.D.   On: 08/08/2014 13:24   Dg Chest Port 1 View  08/08/2014   CLINICAL DATA:  80 year old male with fever weakness and diarrhea for 4 days. Initial encounter.  EXAM: PORTABLE CHEST - 1 VIEW  COMPARISON:  07/21/2013 and earlier.  FINDINGS: Portable AP upright view at 1018 hrs. Stable left chest single lead cardiac AICD. Sequelae of median sternotomy appears stable. Stable cardiomegaly and mediastinal contours. Lung volumes not significantly changed with no pneumothorax, pulmonary edema, pleural effusion or confluent pulmonary opacity.  IMPRESSION: No acute cardiopulmonary abnormality.   Electronically Signed   By: Lars Pinks M.D.   On: 08/08/2014 10:29     Microbiology: Recent Results (from the past 240 hour(s))  Urine culture     Status: None   Collection Time: 08/08/14 11:14 AM  Result Value Ref Range Status   Specimen Description URINE, CATHETERIZED  Final   Special Requests NONE  Final   Culture  Setup Time   Final    08/08/2014 15:14 Performed at  [facility] Performed at [facility]   Final   Culture NO GROWTH Performed at [facility]   Final   Report Status 08/09/2014 FINAL  Final  Culture, blood (routine  x 2)     Status: None (Preliminary result)   Collection Time: 08/08/14 11:53 AM  Result Value Ref Range Status   Specimen Description BLOOD LEFT FOREARM  Final   Special Requests BOTTLES DRAWN AEROBIC AND ANAEROBIC 4CC  Final   Culture  Setup Time   Final    08/08/2014 16:05 Performed at [facility]    Culture   Final           BLOOD CULTURE RECEIVED NO GROWTH TO DATE CULTURE WILL BE HELD FOR 5 DAYS BEFORE ISSUING A FINAL NEGATIVE REPORT Performed at [facility]    Report Status PENDING  Incomplete  Culture, blood (routine x 2)     Status: None (Preliminary result)   Collection Time: 08/08/14 12:25 PM  Result Value Ref Range Status   Specimen Description BLOOD RIGHT HAND  Final   Special Requests BOTTLES DRAWN AEROBIC AND ANAEROBIC 5ML  Final   Culture  Setup Time   Final    08/08/2014 14:45 Performed at [facility]    Culture   Final           BLOOD CULTURE RECEIVED NO GROWTH TO DATE CULTURE WILL BE HELD FOR 5 DAYS BEFORE ISSUING A FINAL NEGATIVE REPORT Performed at [facility]    Report Status PENDING  Incomplete     Labs: Basic Metabolic Panel:  Recent Labs Lab 08/08/14 1010 08/09/14 0400 08/10/14 0420 08/11/14 0557 08/12/14 0522  NA 134* 139 139 141 140  K 4.8 4.4 4.7 4.9 5.0  CL 102 109 111 110 109  CO2 15* 16* 14* 16* 17*  GLUCOSE 243* 115* 143* 193* 197*  BUN 55* 58* 52* 50* 48*  CREATININE 3.45* 3.48* 3.17* 2.93* 2.84*  CALCIUM 9.0 8.6 8.9 9.0 9.1  PHOS  --   --   --  3.6  --    Liver Function Tests:  Recent Labs Lab 08/08/14 1010 08/11/14 0557  AST 9  --   ALT 9  --   ALKPHOS 59  --   BILITOT 0.6  --   PROT 6.7  --   ALBUMIN 3.2* 2.7*   No results for input(s): LIPASE, AMYLASE in the last  168 hours. No results for input(s): AMMONIA in the last 168 hours. CBC:  Recent Labs Lab 08/08/14 1010 08/09/14 0400 08/11/14 0557  WBC 9.4 6.9 5.8  NEUTROABS 7.7  --   --   HGB 11.2* 9.6* 9.7*  HCT 32.6* 28.5* 29.2*  MCV 92.6 94.1 93.9  PLT 122* 116* 138*   Cardiac Enzymes:  Recent Labs Lab 08/08/14 1010 08/09/14 1249  CKTOTAL  --  150  TROPONINI <0.30  --    BNP: Invalid input(s): POCBNP CBG:  Recent Labs Lab 08/11/14 1149 08/11/14 1621 08/11/14 2153 08/12/14 0721 08/12/14 1125  GLUCAP 268* 137* 210* 192* 251*    Time coordinating discharge:  Greater than 30 minutes  Signed:  Carmella Kees, DO [facility] Pager: [phone] 08/12/2014, 12:57 PM

## 2014-08-11 NOTE — Progress Notes (Signed)
Physical Therapy Treatment Patient Details Name: Phuoc Huy MRN: [id number] DOB: [birth year] Today's Date: 08-12-14    History of Present Illness pt was admitted with nausea, vomiting and diarrhea.  he has acute renal failure    PT Comments    Pt with improved activity tolerance.  Encouraged pt to use RW at home to improve activity tolerance, pt states he would rather use his cane.   Follow Up Recommendations  Home health PT     Equipment Recommendations  None recommended by PT    Recommendations for Other Services       Precautions / Restrictions Precautions Precautions: Fall Restrictions Weight Bearing Restrictions: No    Mobility  Bed Mobility Overal bed mobility: Modified Independent                Transfers   Equipment used: Rolling walker (2 wheeled);None   Sit to Stand: Supervision         General transfer comment: VCs for safe technique, hand placement  Ambulation/Gait Ambulation/Gait assistance: Supervision Ambulation Distance (Feet): 270 Feet Assistive device: Rolling walker (2 wheeled)       General Gait Details: encouraged pt to use RW at home for energy conservation.  pt states "I think I will just use my cane"   Stairs            Wheelchair Mobility    Modified Rankin (Stroke Patients Only)       Balance               Standing balance comment: needs close supervision for taking a few steps without the RW                    Cognition Arousal/Alertness: Awake/alert Behavior During Therapy: WFL for tasks assessed/performed Overall Cognitive Status: Within Functional Limits for tasks assessed                      Exercises      General Comments        Pertinent Vitals/Pain Pain Assessment: No/denies pain    Home Living                      Prior Function            PT Goals (current goals can now be found in the care plan section) Progress towards PT goals: Progressing  toward goals    Frequency  Min 3X/week    PT Plan Current plan remains appropriate    Co-evaluation             End of Session Equipment Utilized During Treatment: Gait belt Activity Tolerance: Patient tolerated treatment well Patient left: in chair;with call bell/phone within reach     Time: 7353-2992 PT Time Calculation (min) (ACUTE ONLY): 12 min  Charges:  $Gait Training: 8-22 mins                    G Codes:      Florestine Carmical 08/12/2014, 10:15 AM

## 2014-08-11 NOTE — Progress Notes (Signed)
Assessment/Plan: 1. AKI/CKD vs progressive CKD- prob obstructive etiology s/p foley and hemodynamic with recent GI losses Plan: Flomax per GU with OP f/u 2. Metabolic acidosis-  NaHCO3  for now 3. ID- urine and blood cultures neg so far  Subjective: Interval History: none.  Objective: Vital signs in last 24 hours: Temp:  [97.9 F (36.6 C)-98 F (36.7 C)] 97.9 F (36.6 C) (12/12 0453) Pulse Rate:  [65-69] 69 (12/12 0453) Resp:  [18-20] 20 (12/12 0453) BP: (116-128)/(60-69) 128/69 mmHg (12/12 0453) SpO2:  [95 %-98 %] 98 % (12/12 0453) Weight change:   Intake/Output from previous day: 12/11 0701 - 12/12 0700 In: 480 [P.O.:480] Out: 1450 [Urine:1450] Intake/Output this shift:    General appearance: alert and cooperative Resp: clear to auscultation bilaterally Chest wall: no tenderness Cardio: regular rate and rhythm, S1, S2 normal, no murmur, click, rub or gallop Extremities: edema tr  Lab Results:  Recent Labs  08/09/14 0400 08/11/14 0557  WBC 6.9 5.8  HGB 9.6* 9.7*  HCT 28.5* 29.2*  PLT 116* 138*   BMET:  Recent Labs  08/10/14 0420 08/11/14 0557  NA 139 141  K 4.7 4.9  CL 111 110  CO2 14* 16*  GLUCOSE 143* 193*  BUN 52* 50*  CREATININE 3.17* 2.93*  CALCIUM 8.9 9.0   No results for input(s): PTH in the last 72 hours. Iron Studies: No results for input(s): IRON, TIBC, TRANSFERRIN, FERRITIN in the last 72 hours. Studies/Results: No results found.  Scheduled: . aspirin  325 mg Oral Daily  . calcitRIOL  0.25 mcg Oral Daily  . cholecalciferol  1,000 Units Oral Daily  . fluticasone  1 puff Inhalation Daily  . gabapentin  300 mg Oral TID  . heparin  5,000 Units Subcutaneous 3 times per day  . insulin aspart  0-9 Units Subcutaneous TID WC  . latanoprost  1 drop Both Eyes QHS  . levothyroxine  50 mcg Oral QAC breakfast  . omega-3 acid ethyl esters  2 g Oral BID  . pantoprazole  40 mg Oral  Daily  . simvastatin  40 mg Oral QHS  . sodium bicarbonate  650 mg Oral BID  . tamsulosin  0.4 mg Oral QPC supper     LOS: 3 days   Berman Grainger C 08/11/2014,7:45 AM

## 2014-08-11 NOTE — Progress Notes (Signed)
PROGRESS NOTE  Patrick Boyle WIO:[id number] DOB: [birth year] DOA: 08/08/2014 PCP: Abigail Miyamoto, MD  Assessment/Plan: Vomiting and diarrhea -Patient states that he has not had any vomiting or diarrhea. Approximate 48 hours -Advance diet--presently tolerating -Stool for C. difficile PCR if diarrhea recurs which it has not -He had his vomiting and diarrhea after he took for amoxicillin for prophylaxis for a dental appointment on 08/06/2014 Acute on chronic renal failure -Patient had serum creatinine 1.30 on 11/13/2011 -wife states he had serum creatinine of 2.6 in July 2015 -Renal ultrasound negative for hydronephrosis -Likely from volume depletion in the setting of furosemide use and vomiting with component of BOO -Check CPK--150 -Urinalysis negative for any significant pyuria or proteinuria -appreciate renal followup-->started bicarbonate -08/10/14--appreciate urology input-->plan to go home with foley and f/u with Dr. Diona Fanti in 1 week Fever -resolved -Check influenza PCR--neg -Chest x-ray negative for any infiltrates  -Follow blood cultures --neg to date -Remain off antibiotics as the patient is hemodynamically stable this time  Diabetes mellitus type 2  -check Hemoglobin A1c  -NovoLog sliding scalee Coronary artery disease  -Continue aspirin and statin  COPD  -Stable  -Continue Flovent  Generalized weakness -Multifactorial due to the patient's acute medical condition as well as deconditioning- -PT eval-->HHPT  Family Communication: Wife updated on phone 12/12  Disposition Plan:   Home 08/12/14 if stable         Procedures/Studies: US Renal  08/08/2014   CLINICAL DATA:  80 year old male with acute renal failure. Initial encounter. Current history of Chronic kidney disease.  EXAM: RENAL/URINARY TRACT ULTRASOUND COMPLETE  COMPARISON:  05/15/2014.  FINDINGS: Right Kidney:  Length: 9.5 cm. Unchanged upper pole simple cyst measuring up to 2.0 cm.  Echogenicity Stable and within normal limits. No mass or hydronephrosis visualized.  Left Kidney:  Length: 9.7 cm. Echogenicity Stable and within normal limits. No mass or hydronephrosis visualized.  Bladder:  Appears normal for degree of bladder distention. Bilateral ureteral jets detected.  IMPRESSION: Stable sonographic appearance of the kidneys since September, no obstructive uropathy.   Electronically Signed   By: Lars Pinks M.D.   On: 08/08/2014 13:24   Dg Chest Port 1 View  08/08/2014   CLINICAL DATA:  80 year old male with fever weakness and diarrhea for 4 days. Initial encounter.  EXAM: PORTABLE CHEST - 1 VIEW  COMPARISON:  07/21/2013 and earlier.  FINDINGS: Portable AP upright view at 1018 hrs. Stable left chest single lead cardiac AICD. Sequelae of median sternotomy appears stable. Stable cardiomegaly and mediastinal contours. Lung volumes not significantly changed with no pneumothorax, pulmonary edema, pleural effusion or confluent pulmonary opacity.  IMPRESSION: No acute cardiopulmonary abnormality.   Electronically Signed   By: Lars Pinks M.D.   On: 08/08/2014 10:29         Subjective: Patient denies fevers, chills, headache, chest pain, dyspnea, nausea, vomiting, diarrhea, abdominal pain, dysuria, hematuria   Objective: Filed Vitals:   08/10/14 2104 08/11/14 0453 08/11/14 0812 08/11/14 1439  BP: 116/60 128/69  133/64  Pulse: 65 69  60  Temp: 98 F (36.7 C) 97.9 F (36.6 C)  98 F (36.7 C)  TempSrc: Oral Oral  Oral  Resp: 18 20  18   Height:      Weight:      SpO2: 97% 98% 97% 99%    Intake/Output Summary (Last 24 hours) at 08/11/14 1440 Last data filed at 08/11/14 1110  Gross per 24 hour  Intake  240 ml  Output   1550 ml  Net  -1310 ml   Weight change:  Exam:   General:  Pt is alert, follows commands appropriately, not in acute distress  HEENT: No icterus, No thrush,[place]/AT  Cardiovascular: RRR, S1/S2, no rubs, no gallops  Respiratory: CTA bilaterally, no  wheezing, no crackles, no rhonchi  Abdomen: Soft/+BS, non tender, non distended, no guarding  Extremities: trace LE edema, No lymphangitis, No petechiae, No rashes, no synovitis  Data Reviewed: Basic Metabolic Panel:  Recent Labs Lab 08/08/14 1010 08/09/14 0400 08/10/14 0420 08/11/14 0557  NA 134* 139 139 141  K 4.8 4.4 4.7 4.9  CL 102 109 111 110  CO2 15* 16* 14* 16*  GLUCOSE 243* 115* 143* 193*  BUN 55* 58* 52* 50*  CREATININE 3.45* 3.48* 3.17* 2.93*  CALCIUM 9.0 8.6 8.9 9.0  PHOS  --   --   --  3.6   Liver Function Tests:  Recent Labs Lab 08/08/14 1010 08/11/14 0557  AST 9  --   ALT 9  --   ALKPHOS 59  --   BILITOT 0.6  --   PROT 6.7  --   ALBUMIN 3.2* 2.7*   No results for input(s): LIPASE, AMYLASE in the last 168 hours. No results for input(s): AMMONIA in the last 168 hours. CBC:  Recent Labs Lab 08/08/14 1010 08/09/14 0400 08/11/14 0557  WBC 9.4 6.9 5.8  NEUTROABS 7.7  --   --   HGB 11.2* 9.6* 9.7*  HCT 32.6* 28.5* 29.2*  MCV 92.6 94.1 93.9  PLT 122* 116* 138*   Cardiac Enzymes:  Recent Labs Lab 08/08/14 1010 08/09/14 1249  CKTOTAL  --  150  TROPONINI <0.30  --    BNP: Invalid input(s): POCBNP CBG:  Recent Labs Lab 08/10/14 1202 08/10/14 1638 08/10/14 2103 08/11/14 0737 08/11/14 1149  GLUCAP 223* 157* 227* 200* 268*    Recent Results (from the past 240 hour(s))  Urine culture     Status: None   Collection Time: 08/08/14 11:14 AM  Result Value Ref Range Status   Specimen Description URINE, CATHETERIZED  Final   Special Requests NONE  Final   Culture  Setup Time   Final    08/08/2014 15:14 Performed at [facility] Performed at [facility]   Final   Culture NO GROWTH Performed at [facility]   Final   Report Status 08/09/2014 FINAL  Final  Culture, blood (routine x 2)     Status: None (Preliminary result)   Collection Time: 08/08/14 11:53 AM  Result Value Ref Range  Status   Specimen Description BLOOD LEFT FOREARM  Final   Special Requests BOTTLES DRAWN AEROBIC AND ANAEROBIC 4CC  Final   Culture  Setup Time   Final    08/08/2014 16:05 Performed at [facility]    Culture   Final           BLOOD CULTURE RECEIVED NO GROWTH TO DATE CULTURE WILL BE HELD FOR 5 DAYS BEFORE ISSUING A FINAL NEGATIVE REPORT Performed at [facility]    Report Status PENDING  Incomplete  Culture, blood (routine x 2)     Status: None (Preliminary result)   Collection Time: 08/08/14 12:25 PM  Result Value Ref Range Status   Specimen Description BLOOD RIGHT HAND  Final   Special Requests BOTTLES DRAWN AEROBIC AND ANAEROBIC 5ML  Final   Culture  Setup Time  Final    08/08/2014 14:45 Performed at [facility]    Culture   Final           BLOOD CULTURE RECEIVED NO GROWTH TO DATE CULTURE WILL BE HELD FOR 5 DAYS BEFORE ISSUING A FINAL NEGATIVE REPORT Performed at [facility]    Report Status PENDING  Incomplete     Scheduled Meds: . aspirin  325 mg Oral Daily  . calcitRIOL  0.25 mcg Oral Daily  . cholecalciferol  1,000 Units Oral Daily  . fluticasone  1 puff Inhalation Daily  . gabapentin  300 mg Oral TID  . heparin  5,000 Units Subcutaneous 3 times per day  . insulin aspart  0-9 Units Subcutaneous TID WC  . latanoprost  1 drop Both Eyes QHS  . levothyroxine  50 mcg Oral QAC breakfast  . omega-3 acid ethyl esters  2 g Oral BID  . pantoprazole  40 mg Oral Daily  . simvastatin  40 mg Oral QHS  . sodium bicarbonate  650 mg Oral BID  . tamsulosin  0.4 mg Oral QPC supper   Continuous Infusions:    Daliya Parchment, DO  [facility] Pager [phone]  If 7PM-7AM, please contact night-coverage [email or web address] Password [email or web address] 08/11/2014, 2:40 PM   LOS: 3 days

## 2014-08-12 LAB — GLUCOSE, CAPILLARY
Glucose-Capillary: 192 mg/dL — ABNORMAL HIGH (ref 70–99)
Glucose-Capillary: 251 mg/dL — ABNORMAL HIGH (ref 70–99)

## 2014-08-12 LAB — BASIC METABOLIC PANEL
Anion gap: 14 (ref 5–15)
BUN: 48 mg/dL — AB (ref 6–23)
CHLORIDE: 109 meq/L (ref 96–112)
CO2: 17 mEq/L — ABNORMAL LOW (ref 19–32)
CREATININE: 2.84 mg/dL — AB (ref 0.50–1.35)
Calcium: 9.1 mg/dL (ref 8.4–10.5)
GFR calc Af Amer: 23 mL/min — ABNORMAL LOW (ref >90)
GFR calc non Af Amer: 20 mL/min — ABNORMAL LOW (ref >90)
Glucose, Bld: 197 mg/dL — ABNORMAL HIGH (ref 70–99)
Potassium: 5 mEq/L (ref 3.7–5.3)
Sodium: 140 mEq/L (ref 137–147)

## 2014-08-12 LAB — HEMOGLOBIN A1C
Hgb A1c MFr Bld: 7.7 % — ABNORMAL HIGH (ref <5.7)
Mean Plasma Glucose: 174 mg/dL — ABNORMAL HIGH (ref <117)

## 2014-08-12 MED ORDER — SODIUM BICARBONATE 650 MG PO TABS: 650.0000 mg | ORAL_TABLET | Freq: Two times a day (BID) | ORAL | Status: AC

## 2014-08-12 MED ORDER — TAMSULOSIN HCL 0.4 MG PO CAPS: 0.4000 mg | ORAL_CAPSULE | Freq: Every day | ORAL | Status: AC

## 2014-08-12 MED ORDER — GABAPENTIN 300 MG PO CAPS: 300.0000 mg | ORAL_CAPSULE | Freq: Two times a day (BID) | ORAL | Status: DC

## 2014-08-12 NOTE — Progress Notes (Addendum)
CARE MANAGEMENT NOTE 08/12/2014  Patient:  Patrick Boyle,Patrick Boyle   Account Number:  [id number]  Date Initiated:  08/09/2014  Documentation initiated by:  Edwyna Shell  Subjective/Objective Assessment:   80 yo male admitted with acute renal failure from home with spouse     Action/Plan:   discharge planning   Anticipated DC Date:  08/12/2014   Anticipated DC Plan:  [place]  CM consult      [facility] Choice  HOME HEALTH   Choice offered to / List presented to:  C-1 Patient        [place] arranged  [place]      [place].   Status of service:  Completed, signed off Medicare Important Message given?  YES (If response is "NO", the following Medicare IM given date fields will be blank) Date Medicare IM given:  08/12/2014 Medicare IM given by:  [facility] Date Additional Medicare IM given:   Additional Medicare IM given by:    Discharge Disposition:  [place]  Per UR Regulation:  Reviewed for med. necessity/level of care/duration of stay  If discussed at [place] of Stay Meetings, dates discussed:    Comments:  08/12/2014 1245 NCM spoke to pt and gave permission to speak to wife. Requested  [facility] for [facility] PT and RN. Contacted [facility] for [facility] for scheduled dc home today. Pt states he has RW at home. Jonnie Finner RN CCM Case Mgmt [phone]  08/10/14 Edwyna Shell RN BSN CM [phone] Provided patient with [facility] agency choice list for follow up on PT recommendation for [facility] PT. No orders, will continue to follow  08/09/14 Edwyna Shell RN BSN CM [phone] Patient stated he has walker and cane he uses at home, his spouse is legally blind. He has a PCP and phamracy and has no needs at this time

## 2014-08-12 NOTE — Progress Notes (Signed)
Assessment/Plan: 1. AKI/CKD (Dr Lorrene Reid) vs progressive CKD- prob obstructive etiology s/p foley and hemodynamic with recent GI losses.  Plan: Flomax per GU with OP f/u; OK for DC home from renal perspective.  Will arrange for labs at our office on Tuesday. 2. Metabolic acidosis- continue NaHCO3 for now  . Subjective: Interval History: No new issues.  Wants to go home.  Objective: Vital signs in last 24 hours: Temp:  [97.8 F (36.6 C)-98.1 F (36.7 C)] 98.1 F (36.7 C) (12/13 0544) Pulse Rate:  [58-63] 63 (12/13 0544) Resp:  [18] 18 (12/13 0544) BP: (123-139)/(57-64) 139/63 mmHg (12/13 0544) SpO2:  [95 %-99 %] 95 % (12/13 0544) Weight change:   Intake/Output from previous day: 12/12 0701 - 12/13 0700 In: 600 [P.O.:600] Out: 1950 [Urine:1950] Intake/Output this shift:    General appearance: alert and cooperative Resp: clear to auscultation bilaterally Cardio: regular rate and rhythm, S1, S2 normal, no murmur, click, rub or gallop GI: soft, non-tender; bowel sounds normal; no masses,  no organomegaly  No LE edema  Lab Results:  Recent Labs  08/11/14 0557  WBC 5.8  HGB 9.7*  HCT 29.2*  PLT 138*   BMET:  Recent Labs  08/11/14 0557 08/12/14 0522  NA 141 140  K 4.9 5.0  CL 110 109  CO2 16* 17*  GLUCOSE 193* 197*  BUN 50* 48*  CREATININE 2.93* 2.84*  CALCIUM 9.0 9.1   No results for input(s): PTH in the last 72 hours. Iron Studies: No results for input(s): IRON, TIBC, TRANSFERRIN, FERRITIN in the last 72 hours. Studies/Results: No results found.  Scheduled: . aspirin  325 mg Oral Daily  . calcitRIOL  0.25 mcg Oral Daily  . cholecalciferol  1,000 Units Oral Daily  . fluticasone  1 puff Inhalation Daily  . gabapentin  300 mg Oral BID  . heparin  5,000 Units Subcutaneous 3 times per day  . insulin aspart  0-9 Units Subcutaneous TID WC  . latanoprost  1 drop Both Eyes QHS  . levothyroxine  50 mcg Oral QAC breakfast  . omega-3  acid ethyl esters  2 g Oral BID  . pantoprazole  40 mg Oral Daily  . simvastatin  40 mg Oral QHS  . sodium bicarbonate  650 mg Oral BID  . tamsulosin  0.4 mg Oral QPC supper     LOS: 4 days   Patrick Boyle C 08/12/2014,8:27 AM

## 2014-08-13 DIAGNOSIS — Z466 Encounter for fitting and adjustment of urinary device: Secondary | ICD-10-CM | POA: Diagnosis not present

## 2014-08-13 DIAGNOSIS — T368X5D Adverse effect of other systemic antibiotics, subsequent encounter: Secondary | ICD-10-CM | POA: Diagnosis not present

## 2014-08-13 DIAGNOSIS — M1990 Unspecified osteoarthritis, unspecified site: Secondary | ICD-10-CM | POA: Diagnosis not present

## 2014-08-13 DIAGNOSIS — S99922D Unspecified injury of left foot, subsequent encounter: Secondary | ICD-10-CM | POA: Diagnosis not present

## 2014-08-13 DIAGNOSIS — E119 Type 2 diabetes mellitus without complications: Secondary | ICD-10-CM | POA: Diagnosis not present

## 2014-08-13 DIAGNOSIS — I255 Ischemic cardiomyopathy: Secondary | ICD-10-CM | POA: Diagnosis not present

## 2014-08-13 DIAGNOSIS — N189 Chronic kidney disease, unspecified: Secondary | ICD-10-CM | POA: Diagnosis not present

## 2014-08-13 DIAGNOSIS — I2510 Atherosclerotic heart disease of native coronary artery without angina pectoris: Secondary | ICD-10-CM | POA: Diagnosis not present

## 2014-08-13 DIAGNOSIS — J449 Chronic obstructive pulmonary disease, unspecified: Secondary | ICD-10-CM | POA: Diagnosis not present

## 2014-08-14 DIAGNOSIS — T368X5D Adverse effect of other systemic antibiotics, subsequent encounter: Secondary | ICD-10-CM | POA: Diagnosis not present

## 2014-08-14 DIAGNOSIS — I255 Ischemic cardiomyopathy: Secondary | ICD-10-CM | POA: Diagnosis not present

## 2014-08-14 DIAGNOSIS — N172 Acute kidney failure with medullary necrosis: Secondary | ICD-10-CM | POA: Diagnosis not present

## 2014-08-14 DIAGNOSIS — I2510 Atherosclerotic heart disease of native coronary artery without angina pectoris: Secondary | ICD-10-CM | POA: Diagnosis not present

## 2014-08-14 DIAGNOSIS — N189 Chronic kidney disease, unspecified: Secondary | ICD-10-CM | POA: Diagnosis not present

## 2014-08-14 DIAGNOSIS — E119 Type 2 diabetes mellitus without complications: Secondary | ICD-10-CM | POA: Diagnosis not present

## 2014-08-14 DIAGNOSIS — J449 Chronic obstructive pulmonary disease, unspecified: Secondary | ICD-10-CM | POA: Diagnosis not present

## 2014-08-14 LAB — CULTURE, BLOOD (ROUTINE X 2)
CULTURE: NO GROWTH
Culture: NO GROWTH

## 2014-08-16 DIAGNOSIS — N189 Chronic kidney disease, unspecified: Secondary | ICD-10-CM | POA: Diagnosis not present

## 2014-08-16 DIAGNOSIS — T368X5D Adverse effect of other systemic antibiotics, subsequent encounter: Secondary | ICD-10-CM | POA: Diagnosis not present

## 2014-08-16 DIAGNOSIS — I2510 Atherosclerotic heart disease of native coronary artery without angina pectoris: Secondary | ICD-10-CM | POA: Diagnosis not present

## 2014-08-16 DIAGNOSIS — J449 Chronic obstructive pulmonary disease, unspecified: Secondary | ICD-10-CM | POA: Diagnosis not present

## 2014-08-16 DIAGNOSIS — I255 Ischemic cardiomyopathy: Secondary | ICD-10-CM | POA: Diagnosis not present

## 2014-08-16 DIAGNOSIS — E119 Type 2 diabetes mellitus without complications: Secondary | ICD-10-CM | POA: Diagnosis not present

## 2014-08-17 DIAGNOSIS — I2510 Atherosclerotic heart disease of native coronary artery without angina pectoris: Secondary | ICD-10-CM | POA: Diagnosis not present

## 2014-08-17 DIAGNOSIS — T368X5D Adverse effect of other systemic antibiotics, subsequent encounter: Secondary | ICD-10-CM | POA: Diagnosis not present

## 2014-08-17 DIAGNOSIS — I255 Ischemic cardiomyopathy: Secondary | ICD-10-CM | POA: Diagnosis not present

## 2014-08-17 DIAGNOSIS — J449 Chronic obstructive pulmonary disease, unspecified: Secondary | ICD-10-CM | POA: Diagnosis not present

## 2014-08-17 DIAGNOSIS — E119 Type 2 diabetes mellitus without complications: Secondary | ICD-10-CM | POA: Diagnosis not present

## 2014-08-17 DIAGNOSIS — R339 Retention of urine, unspecified: Secondary | ICD-10-CM | POA: Diagnosis not present

## 2014-08-17 DIAGNOSIS — N189 Chronic kidney disease, unspecified: Secondary | ICD-10-CM | POA: Diagnosis not present

## 2014-08-20 ENCOUNTER — Encounter (HOSPITAL_COMMUNITY): Payer: Self-pay | Admitting: *Deleted

## 2014-08-20 ENCOUNTER — Inpatient Hospital Stay (HOSPITAL_COMMUNITY)
Admission: EM | Admit: 2014-08-20 | Discharge: 2014-08-27 | DRG: 871 | Disposition: A | Discharge status: Home Health | Payer: Medicare Other | Attending: Internal Medicine | Admitting: Internal Medicine

## 2014-08-20 ENCOUNTER — Emergency Department (HOSPITAL_COMMUNITY): Payer: Medicare Other

## 2014-08-20 DIAGNOSIS — N3000 Acute cystitis without hematuria: Secondary | ICD-10-CM | POA: Diagnosis present

## 2014-08-20 DIAGNOSIS — D62 Acute posthemorrhagic anemia: Secondary | ICD-10-CM | POA: Diagnosis present

## 2014-08-20 DIAGNOSIS — K635 Polyp of colon: Secondary | ICD-10-CM | POA: Diagnosis present

## 2014-08-20 DIAGNOSIS — I255 Ischemic cardiomyopathy: Secondary | ICD-10-CM | POA: Diagnosis present

## 2014-08-20 DIAGNOSIS — E785 Hyperlipidemia, unspecified: Secondary | ICD-10-CM | POA: Diagnosis present

## 2014-08-20 DIAGNOSIS — N400 Enlarged prostate without lower urinary tract symptoms: Secondary | ICD-10-CM | POA: Diagnosis not present

## 2014-08-20 DIAGNOSIS — N401 Enlarged prostate with lower urinary tract symptoms: Secondary | ICD-10-CM | POA: Diagnosis present

## 2014-08-20 DIAGNOSIS — Z7982 Long term (current) use of aspirin: Secondary | ICD-10-CM

## 2014-08-20 DIAGNOSIS — J449 Chronic obstructive pulmonary disease, unspecified: Secondary | ICD-10-CM | POA: Diagnosis present

## 2014-08-20 DIAGNOSIS — K5730 Diverticulosis of large intestine without perforation or abscess without bleeding: Secondary | ICD-10-CM | POA: Diagnosis not present

## 2014-08-20 DIAGNOSIS — Z86010 Personal history of colonic polyps: Secondary | ICD-10-CM

## 2014-08-20 DIAGNOSIS — K219 Gastro-esophageal reflux disease without esophagitis: Secondary | ICD-10-CM | POA: Diagnosis present

## 2014-08-20 DIAGNOSIS — I2510 Atherosclerotic heart disease of native coronary artery without angina pectoris: Secondary | ICD-10-CM | POA: Diagnosis present

## 2014-08-20 DIAGNOSIS — N179 Acute kidney failure, unspecified: Secondary | ICD-10-CM | POA: Diagnosis present

## 2014-08-20 DIAGNOSIS — E119 Type 2 diabetes mellitus without complications: Secondary | ICD-10-CM | POA: Diagnosis present

## 2014-08-20 DIAGNOSIS — B961 Klebsiella pneumoniae [K. pneumoniae] as the cause of diseases classified elsewhere: Secondary | ICD-10-CM | POA: Diagnosis present

## 2014-08-20 DIAGNOSIS — K5791 Diverticulosis of intestine, part unspecified, without perforation or abscess with bleeding: Secondary | ICD-10-CM | POA: Diagnosis not present

## 2014-08-20 DIAGNOSIS — H8109 Meniere's disease, unspecified ear: Secondary | ICD-10-CM | POA: Diagnosis present

## 2014-08-20 DIAGNOSIS — N390 Urinary tract infection, site not specified: Secondary | ICD-10-CM | POA: Diagnosis present

## 2014-08-20 DIAGNOSIS — I129 Hypertensive chronic kidney disease with stage 1 through stage 4 chronic kidney disease, or unspecified chronic kidney disease: Secondary | ICD-10-CM | POA: Diagnosis present

## 2014-08-20 DIAGNOSIS — K648 Other hemorrhoids: Secondary | ICD-10-CM | POA: Diagnosis present

## 2014-08-20 DIAGNOSIS — D124 Benign neoplasm of descending colon: Secondary | ICD-10-CM | POA: Diagnosis not present

## 2014-08-20 DIAGNOSIS — R509 Fever, unspecified: Secondary | ICD-10-CM | POA: Diagnosis not present

## 2014-08-20 DIAGNOSIS — D122 Benign neoplasm of ascending colon: Secondary | ICD-10-CM | POA: Diagnosis not present

## 2014-08-20 DIAGNOSIS — K449 Diaphragmatic hernia without obstruction or gangrene: Secondary | ICD-10-CM | POA: Diagnosis present

## 2014-08-20 DIAGNOSIS — Z87891 Personal history of nicotine dependence: Secondary | ICD-10-CM | POA: Diagnosis not present

## 2014-08-20 DIAGNOSIS — K5731 Diverticulosis of large intestine without perforation or abscess with bleeding: Secondary | ICD-10-CM | POA: Diagnosis present

## 2014-08-20 DIAGNOSIS — Z951 Presence of aortocoronary bypass graft: Secondary | ICD-10-CM

## 2014-08-20 DIAGNOSIS — N320 Bladder-neck obstruction: Secondary | ICD-10-CM | POA: Diagnosis present

## 2014-08-20 DIAGNOSIS — R339 Retention of urine, unspecified: Secondary | ICD-10-CM | POA: Diagnosis present

## 2014-08-20 DIAGNOSIS — I252 Old myocardial infarction: Secondary | ICD-10-CM

## 2014-08-20 DIAGNOSIS — Z95810 Presence of automatic (implantable) cardiac defibrillator: Secondary | ICD-10-CM

## 2014-08-20 DIAGNOSIS — D649 Anemia, unspecified: Secondary | ICD-10-CM | POA: Diagnosis present

## 2014-08-20 DIAGNOSIS — I5022 Chronic systolic (congestive) heart failure: Secondary | ICD-10-CM | POA: Diagnosis present

## 2014-08-20 DIAGNOSIS — K922 Gastrointestinal hemorrhage, unspecified: Secondary | ICD-10-CM | POA: Diagnosis present

## 2014-08-20 DIAGNOSIS — A419 Sepsis, unspecified organism: Principal | ICD-10-CM | POA: Diagnosis present

## 2014-08-20 DIAGNOSIS — I700 Atherosclerosis of aorta: Secondary | ICD-10-CM | POA: Diagnosis not present

## 2014-08-20 DIAGNOSIS — R404 Transient alteration of awareness: Secondary | ICD-10-CM | POA: Diagnosis not present

## 2014-08-20 DIAGNOSIS — N183 Chronic kidney disease, stage 3 (moderate): Secondary | ICD-10-CM | POA: Diagnosis not present

## 2014-08-20 DIAGNOSIS — I4892 Unspecified atrial flutter: Secondary | ICD-10-CM | POA: Diagnosis present

## 2014-08-20 DIAGNOSIS — E1140 Type 2 diabetes mellitus with diabetic neuropathy, unspecified: Secondary | ICD-10-CM | POA: Diagnosis present

## 2014-08-20 DIAGNOSIS — R531 Weakness: Secondary | ICD-10-CM | POA: Diagnosis not present

## 2014-08-20 DIAGNOSIS — K2980 Duodenitis without bleeding: Secondary | ICD-10-CM | POA: Diagnosis present

## 2014-08-20 DIAGNOSIS — N138 Other obstructive and reflux uropathy: Secondary | ICD-10-CM | POA: Diagnosis present

## 2014-08-20 DIAGNOSIS — N184 Chronic kidney disease, stage 4 (severe): Secondary | ICD-10-CM | POA: Diagnosis present

## 2014-08-20 DIAGNOSIS — T368X5D Adverse effect of other systemic antibiotics, subsequent encounter: Secondary | ICD-10-CM | POA: Diagnosis not present

## 2014-08-20 DIAGNOSIS — D120 Benign neoplasm of cecum: Secondary | ICD-10-CM | POA: Diagnosis not present

## 2014-08-20 DIAGNOSIS — N189 Chronic kidney disease, unspecified: Secondary | ICD-10-CM | POA: Insufficient documentation

## 2014-08-20 DIAGNOSIS — Z79899 Other long term (current) drug therapy: Secondary | ICD-10-CM | POA: Diagnosis not present

## 2014-08-20 DIAGNOSIS — D126 Benign neoplasm of colon, unspecified: Secondary | ICD-10-CM | POA: Diagnosis not present

## 2014-08-20 LAB — URINALYSIS, ROUTINE W REFLEX MICROSCOPIC
Bilirubin Urine: NEGATIVE
Glucose, UA: NEGATIVE mg/dL
Ketones, ur: NEGATIVE mg/dL
Nitrite: NEGATIVE
Protein, ur: NEGATIVE mg/dL
Specific Gravity, Urine: 1.015 (ref 1.005–1.030)
Urobilinogen, UA: 0.2 mg/dL (ref 0.0–1.0)
pH: 5.5 (ref 5.0–8.0)

## 2014-08-20 LAB — CBC WITH DIFFERENTIAL/PLATELET
Basophils Absolute: 0 10*3/uL (ref 0.0–0.1)
Basophils Relative: 0 % (ref 0–1)
Eosinophils Absolute: 0.2 10*3/uL (ref 0.0–0.7)
Eosinophils Relative: 1 % (ref 0–5)
HCT: 30.6 % — ABNORMAL LOW (ref 39.0–52.0)
Hemoglobin: 10 g/dL — ABNORMAL LOW (ref 13.0–17.0)
Lymphocytes Relative: 13 % (ref 12–46)
Lymphs Abs: 1.7 10*3/uL (ref 0.7–4.0)
MCH: 31.2 pg (ref 26.0–34.0)
MCHC: 32.7 g/dL (ref 30.0–36.0)
MCV: 95.3 fL (ref 78.0–100.0)
Monocytes Absolute: 0.9 10*3/uL (ref 0.1–1.0)
Monocytes Relative: 7 % (ref 3–12)
Neutro Abs: 10.2 10*3/uL — ABNORMAL HIGH (ref 1.7–7.7)
Neutrophils Relative %: 79 % — ABNORMAL HIGH (ref 43–77)
Platelets: 187 10*3/uL (ref 150–400)
RBC: 3.21 MIL/uL — ABNORMAL LOW (ref 4.22–5.81)
RDW: 12.9 % (ref 11.5–15.5)
WBC: 13 10*3/uL — ABNORMAL HIGH (ref 4.0–10.5)

## 2014-08-20 LAB — COMPREHENSIVE METABOLIC PANEL
ALT: 21 U/L (ref 0–53)
AST: 20 U/L (ref 0–37)
Albumin: 3.1 g/dL — ABNORMAL LOW (ref 3.5–5.2)
Alkaline Phosphatase: 59 U/L (ref 39–117)
Anion gap: 15 (ref 5–15)
BUN: 36 mg/dL — ABNORMAL HIGH (ref 6–23)
CO2: 19 mEq/L (ref 19–32)
Calcium: 9 mg/dL (ref 8.4–10.5)
Chloride: 103 mEq/L (ref 96–112)
Creatinine, Ser: 2.7 mg/dL — ABNORMAL HIGH (ref 0.50–1.35)
GFR calc Af Amer: 24 mL/min — ABNORMAL LOW (ref >90)
GFR calc non Af Amer: 21 mL/min — ABNORMAL LOW (ref >90)
Glucose, Bld: 114 mg/dL — ABNORMAL HIGH (ref 70–99)
Potassium: 4.5 mEq/L (ref 3.7–5.3)
Sodium: 137 mEq/L (ref 137–147)
Total Bilirubin: 0.3 mg/dL (ref 0.3–1.2)
Total Protein: 6.7 g/dL (ref 6.0–8.3)

## 2014-08-20 LAB — URINE MICROSCOPIC-ADD ON

## 2014-08-20 LAB — I-STAT CG4 LACTIC ACID, ED: Lactic Acid, Venous: 0.88 mmol/L (ref 0.5–2.2)

## 2014-08-20 MED ORDER — DEXTROSE 5 % IV SOLN
1.0000 g | Freq: Once | INTRAVENOUS | Status: AC
  Administered 2014-08-20: 1 g via INTRAVENOUS
  Filled 2014-08-20: qty 10

## 2014-08-20 MED ORDER — IOHEXOL 300 MG/ML  SOLN: 100.0000 mL | Freq: Once | INTRAMUSCULAR | Status: DC | PRN

## 2014-08-20 MED ORDER — IOHEXOL 300 MG/ML  SOLN: 50.0000 mL | Freq: Once | INTRAMUSCULAR | Status: DC | PRN

## 2014-08-20 MED ORDER — SODIUM CHLORIDE 0.9 % IV BOLUS (SEPSIS)
1000.0000 mL | Freq: Once | INTRAVENOUS | Status: AC
  Administered 2014-08-20: 1000 mL via INTRAVENOUS

## 2014-08-20 NOTE — ED Notes (Signed)
Per EMS, pt complains of weakness X 4 days. Pt was discharged from hospital on 12/13, where he had a foley catheter. Pt noticed he felt weaker today after physical therapy. Pt has rectal temp of 101.6. Pt is A&Ox4.

## 2014-08-20 NOTE — ED Provider Notes (Signed)
CSN: [id number]     Arrival date & time 08/20/14  2025 History   First MD Initiated Contact with Patient 08/20/14 2031     Chief Complaint  Patient presents with  . Fever  . Weakness     (Consider location/radiation/quality/duration/timing/severity/associated sxs/prior Treatment) HPI Patient presents to the emergency department with weakness and fever over the last 24 hours.  The patient states he was recently hospitalized with similar symptoms.  Patient states that he had a Foley catheter placed and was discharged home with this patient, states that today he could not get out of bed.  Patient denies chest pain, shortness of breath, nausea, vomiting, diarrhea, headache, blurred vision, back pain, neck pain, near syncope, lightheadedness or syncope.  The patient states that he does have some abdominal pain.  Patient states that he has not any other medications prior to arrival.  Nothing seems to make his condition, better or worse Past Medical History  Diagnosis Date  . Diabetes mellitus     dr Olen Pel  . Hyperlipidemia   . Hypertension   . Coronary artery disease     s/p CABG 1997  . GERD (gastroesophageal reflux disease)   . Arthritis   . Allergic rhinitis   . Hyperplastic colon polyp   . Diverticulitis, colon   . Meniere's syndrome   . Tremor, essential     dr Jannifer Franklin  . COPD, mild     dr clance  . MI (myocardial infarction) 1997, 2005  . Ischemic cardiomyopathy     sp ICD (SJM) implanted by Dr Rayann Heman   Past Surgical History  Procedure Laterality Date  . Coronary artery bypass graft  [place]  . Cardiac catheterization  2005    medical therapy  . Cardiac defibrillator placement      ICD implanted by Dr Rayann Heman, Analyze ST study patient   Family History  Problem Relation Age of Onset  . Parkinsonism Father   . Stomach cancer Mother   . Diabetes Mother    History  Substance Use Topics  . Smoking status: Former Smoker -- 35 years    Types: Cigarettes    Quit date:  09/01/1995  . Smokeless tobacco: Not on file  . Alcohol Use: 0.0 oz/week     Comment: occasional scotch    Review of Systems  All other systems negative except as documented in the HPI. All pertinent positives and negatives as reviewed in the HPI.  Allergies  Metformin and related and Ramipril  Home Medications   Prior to Admission medications   Medication Sig Start Date End Date Taking? Authorizing Provider  Acetaminophen (ARTHRITIS PAIN RELIEF PO) Take 650 mg by mouth 2 (two) times daily.    Yes Historical Provider, MD  aspirin 325 MG tablet Take 325 mg by mouth daily.     Yes Historical Provider, MD  bimatoprost (LUMIGAN) 0.03 % ophthalmic solution Place 1 drop into both eyes at bedtime.    Yes Historical Provider, MD  calcitRIOL (ROCALTROL) 0.25 MCG capsule Take 0.25 mcg by mouth daily.   Yes Historical Provider, MD  cetirizine (ZYRTEC) 10 MG tablet Take 5 mg by mouth daily.     Yes Historical Provider, MD  cholecalciferol (VITAMIN D) 1000 UNITS tablet Take 1,000 Units by mouth daily.   Yes Historical Provider, MD  donepezil (ARICEPT) 10 MG tablet Take 10 mg by mouth at bedtime.   Yes Historical Provider, MD  fish oil-omega-3 fatty acids 1000 MG capsule Take 2 g by  mouth 2 (two) times daily.    Yes Historical Provider, MD  Fluticasone Propionate, Inhal, (FLOVENT DISKUS) 100 MCG/BLIST AEPB Inhale 1 puff into the lungs daily.    Yes Historical Provider, MD  gabapentin (NEURONTIN) 300 MG capsule Take 1 capsule (300 mg total) by mouth 2 (two) times daily. 08/12/14  Yes Orson Eva, MD  glimepiride (AMARYL) 4 MG tablet Take 4 mg by mouth 2 (two) times daily.     Yes Historical Provider, MD  lansoprazole (PREVACID) 30 MG capsule Take 30 mg by mouth daily as needed (for acid reflex).    Yes Historical Provider, MD  levothyroxine (SYNTHROID, LEVOTHROID) 50 MCG tablet Take 50 mcg by mouth daily before breakfast.   Yes Historical Provider, MD  meclizine (ANTIVERT) 25 MG tablet Take 25 mg by  mouth daily as needed (for vertigo).    Yes Historical Provider, MD  psyllium (REGULOID) 0.52 G capsule Take 0.52 g by mouth daily.   Yes Historical Provider, MD  simvastatin (ZOCOR) 40 MG tablet Take 40 mg by mouth at bedtime.     Yes Historical Provider, MD  sitaGLIPtin (JANUVIA) 50 MG tablet Take 50 mg by mouth daily with breakfast.   Yes Historical Provider, MD  sodium bicarbonate 650 MG tablet Take 1 tablet (650 mg total) by mouth 2 (two) times daily. Patient taking differently: Take 650 mg by mouth 3 (three) times daily.  08/12/14  Yes Orson Eva, MD  tamsulosin (FLOMAX) 0.4 MG CAPS capsule Take 1 capsule (0.4 mg total) by mouth daily after supper. 08/12/14  Yes Orson Eva, MD  traMADol (ULTRAM) 50 MG tablet Take 1 tablet by mouth every 6 (six) hours as needed. For 10 days 08/12/14  Yes Historical Provider, MD  furosemide (LASIX) 20 MG tablet Take 20 mg by mouth every other day.     Historical Provider, MD   BP 141/75 mmHg  Pulse 65  Temp(Src) 101.6 F (38.7 C) (Rectal)  Resp 20  Ht 5\' 11"  (1.803 m)  Wt 190 lb (86.183 kg)  BMI 26.51 kg/m2  SpO2 99% Physical Exam  Constitutional: He is oriented to person, place, and time. He appears well-developed and well-nourished. No distress.  HENT:  Head: Normocephalic and atraumatic.  Mouth/Throat: Oropharynx is clear and moist.  Eyes: Pupils are equal, round, and reactive to light.  Neck: Normal range of motion. Neck supple.  Cardiovascular: Normal rate, regular rhythm and normal heart sounds.  Exam reveals no gallop and no friction rub.   No murmur heard. Pulmonary/Chest: Effort normal and breath sounds normal. No respiratory distress.  Abdominal: Soft. Bowel sounds are normal. He exhibits no distension. There is tenderness. There is no rebound and no guarding.  Musculoskeletal: He exhibits no edema.  Neurological: He is alert and oriented to person, place, and time. He exhibits normal muscle tone. Coordination normal.  Skin: Skin is warm  and dry. No rash noted. No erythema.  Nursing note and vitals reviewed.   ED Course  Procedures (including critical care time) Labs Review Labs Reviewed  URINALYSIS, ROUTINE W REFLEX MICROSCOPIC - Abnormal; Notable for the following:    APPearance CLOUDY (*)    Hgb urine dipstick MODERATE (*)    Leukocytes, UA MODERATE (*)    All other components within normal limits  COMPREHENSIVE METABOLIC PANEL - Abnormal; Notable for the following:    Glucose, Bld 114 (*)    BUN 36 (*)    Creatinine, Ser 2.70 (*)    Albumin 3.1 (*)  GFR calc non Af Amer 21 (*)    GFR calc Af Amer 24 (*)    All other components within normal limits  CBC WITH DIFFERENTIAL - Abnormal; Notable for the following:    WBC 13.0 (*)    RBC 3.21 (*)    Hemoglobin 10.0 (*)    HCT 30.6 (*)    Neutrophils Relative % 79 (*)    Neutro Abs 10.2 (*)    All other components within normal limits  URINE MICROSCOPIC-ADD ON - Abnormal; Notable for the following:    Bacteria, UA MANY (*)    All other components within normal limits  URINE CULTURE  I-STAT CG4 LACTIC ACID, ED    Imaging Review No results found.   EKG Interpretation   Date/Time:  Monday August 20 2014 20:25:21 EST Ventricular Rate:  68 PR Interval:  219 QRS Duration: 131 QT Interval:  401 QTC Calculation: 426 R Axis:   -36 Text Interpretation:  Sinus rhythm Borderline prolonged PR interval  Nonspecific IVCD with LAD Consider anterior infarct Nonspecific T  abnormalities, lateral leads Similar to prior Confirmed by Mingo Amber  MD,  BLAIR ([id number]) on 08/20/2014 10:31:09 PM     Patient will need admission to the hospital for fever and UTI with recent hospitalization an indwelling catheter.  Spoke with the hospitalist about this patient MDM   Final diagnoses:  Fever        Brent General, PA-C 08/20/14 [place], MD 08/20/14 2351

## 2014-08-20 NOTE — ED Notes (Signed)
Pt transported to CT ?

## 2014-08-20 NOTE — ED Notes (Signed)
Bed: XB93 Expected date: 08/20/14 Expected time: 8:02 PM Means of arrival: Ambulance Comments: Fever, kidney failure

## 2014-08-21 DIAGNOSIS — R339 Retention of urine, unspecified: Secondary | ICD-10-CM | POA: Diagnosis not present

## 2014-08-21 DIAGNOSIS — B961 Klebsiella pneumoniae [K. pneumoniae] as the cause of diseases classified elsewhere: Secondary | ICD-10-CM | POA: Diagnosis present

## 2014-08-21 DIAGNOSIS — K449 Diaphragmatic hernia without obstruction or gangrene: Secondary | ICD-10-CM | POA: Diagnosis present

## 2014-08-21 DIAGNOSIS — N184 Chronic kidney disease, stage 4 (severe): Secondary | ICD-10-CM | POA: Diagnosis not present

## 2014-08-21 DIAGNOSIS — K648 Other hemorrhoids: Secondary | ICD-10-CM | POA: Diagnosis present

## 2014-08-21 DIAGNOSIS — D120 Benign neoplasm of cecum: Secondary | ICD-10-CM | POA: Diagnosis not present

## 2014-08-21 DIAGNOSIS — Z951 Presence of aortocoronary bypass graft: Secondary | ICD-10-CM | POA: Diagnosis not present

## 2014-08-21 DIAGNOSIS — I5022 Chronic systolic (congestive) heart failure: Secondary | ICD-10-CM | POA: Diagnosis not present

## 2014-08-21 DIAGNOSIS — E119 Type 2 diabetes mellitus without complications: Secondary | ICD-10-CM | POA: Diagnosis present

## 2014-08-21 DIAGNOSIS — Z79899 Other long term (current) drug therapy: Secondary | ICD-10-CM | POA: Diagnosis not present

## 2014-08-21 DIAGNOSIS — D122 Benign neoplasm of ascending colon: Secondary | ICD-10-CM | POA: Diagnosis not present

## 2014-08-21 DIAGNOSIS — K922 Gastrointestinal hemorrhage, unspecified: Secondary | ICD-10-CM | POA: Diagnosis not present

## 2014-08-21 DIAGNOSIS — I252 Old myocardial infarction: Secondary | ICD-10-CM | POA: Diagnosis not present

## 2014-08-21 DIAGNOSIS — K2980 Duodenitis without bleeding: Secondary | ICD-10-CM | POA: Diagnosis not present

## 2014-08-21 DIAGNOSIS — Z87891 Personal history of nicotine dependence: Secondary | ICD-10-CM | POA: Diagnosis not present

## 2014-08-21 DIAGNOSIS — I2510 Atherosclerotic heart disease of native coronary artery without angina pectoris: Secondary | ICD-10-CM | POA: Diagnosis present

## 2014-08-21 DIAGNOSIS — K635 Polyp of colon: Secondary | ICD-10-CM | POA: Diagnosis present

## 2014-08-21 DIAGNOSIS — Z7982 Long term (current) use of aspirin: Secondary | ICD-10-CM | POA: Diagnosis not present

## 2014-08-21 DIAGNOSIS — A419 Sepsis, unspecified organism: Secondary | ICD-10-CM | POA: Diagnosis present

## 2014-08-21 DIAGNOSIS — Z86010 Personal history of colonic polyps: Secondary | ICD-10-CM | POA: Diagnosis not present

## 2014-08-21 DIAGNOSIS — I255 Ischemic cardiomyopathy: Secondary | ICD-10-CM | POA: Diagnosis present

## 2014-08-21 DIAGNOSIS — N390 Urinary tract infection, site not specified: Secondary | ICD-10-CM | POA: Diagnosis not present

## 2014-08-21 DIAGNOSIS — I129 Hypertensive chronic kidney disease with stage 1 through stage 4 chronic kidney disease, or unspecified chronic kidney disease: Secondary | ICD-10-CM | POA: Diagnosis present

## 2014-08-21 DIAGNOSIS — J449 Chronic obstructive pulmonary disease, unspecified: Secondary | ICD-10-CM | POA: Diagnosis present

## 2014-08-21 DIAGNOSIS — E785 Hyperlipidemia, unspecified: Secondary | ICD-10-CM | POA: Diagnosis present

## 2014-08-21 DIAGNOSIS — K5791 Diverticulosis of intestine, part unspecified, without perforation or abscess with bleeding: Secondary | ICD-10-CM | POA: Diagnosis not present

## 2014-08-21 DIAGNOSIS — E1140 Type 2 diabetes mellitus with diabetic neuropathy, unspecified: Secondary | ICD-10-CM | POA: Diagnosis present

## 2014-08-21 DIAGNOSIS — K5730 Diverticulosis of large intestine without perforation or abscess without bleeding: Secondary | ICD-10-CM | POA: Diagnosis not present

## 2014-08-21 DIAGNOSIS — D126 Benign neoplasm of colon, unspecified: Secondary | ICD-10-CM | POA: Diagnosis not present

## 2014-08-21 DIAGNOSIS — N320 Bladder-neck obstruction: Secondary | ICD-10-CM | POA: Diagnosis present

## 2014-08-21 DIAGNOSIS — I4892 Unspecified atrial flutter: Secondary | ICD-10-CM | POA: Diagnosis present

## 2014-08-21 DIAGNOSIS — N3000 Acute cystitis without hematuria: Secondary | ICD-10-CM

## 2014-08-21 DIAGNOSIS — N179 Acute kidney failure, unspecified: Secondary | ICD-10-CM | POA: Diagnosis not present

## 2014-08-21 DIAGNOSIS — N189 Chronic kidney disease, unspecified: Secondary | ICD-10-CM | POA: Diagnosis not present

## 2014-08-21 DIAGNOSIS — D62 Acute posthemorrhagic anemia: Secondary | ICD-10-CM | POA: Diagnosis not present

## 2014-08-21 DIAGNOSIS — N183 Chronic kidney disease, stage 3 (moderate): Secondary | ICD-10-CM | POA: Diagnosis not present

## 2014-08-21 DIAGNOSIS — N400 Enlarged prostate without lower urinary tract symptoms: Secondary | ICD-10-CM | POA: Diagnosis not present

## 2014-08-21 DIAGNOSIS — Z95810 Presence of automatic (implantable) cardiac defibrillator: Secondary | ICD-10-CM | POA: Diagnosis not present

## 2014-08-21 DIAGNOSIS — K5731 Diverticulosis of large intestine without perforation or abscess with bleeding: Secondary | ICD-10-CM | POA: Diagnosis not present

## 2014-08-21 DIAGNOSIS — D124 Benign neoplasm of descending colon: Secondary | ICD-10-CM | POA: Diagnosis not present

## 2014-08-21 DIAGNOSIS — N138 Other obstructive and reflux uropathy: Secondary | ICD-10-CM | POA: Diagnosis present

## 2014-08-21 DIAGNOSIS — H8109 Meniere's disease, unspecified ear: Secondary | ICD-10-CM | POA: Diagnosis present

## 2014-08-21 DIAGNOSIS — N401 Enlarged prostate with lower urinary tract symptoms: Secondary | ICD-10-CM | POA: Diagnosis present

## 2014-08-21 DIAGNOSIS — K219 Gastro-esophageal reflux disease without esophagitis: Secondary | ICD-10-CM | POA: Diagnosis present

## 2014-08-21 LAB — BASIC METABOLIC PANEL
Anion gap: 10 (ref 5–15)
BUN: 37 mg/dL — ABNORMAL HIGH (ref 6–23)
CHLORIDE: 108 meq/L (ref 96–112)
CO2: 18 mmol/L — AB (ref 19–32)
CREATININE: 2.79 mg/dL — AB (ref 0.50–1.35)
Calcium: 8.4 mg/dL (ref 8.4–10.5)
GFR calc Af Amer: 23 mL/min — ABNORMAL LOW (ref >90)
GFR calc non Af Amer: 20 mL/min — ABNORMAL LOW (ref >90)
GLUCOSE: 137 mg/dL — AB (ref 70–99)
POTASSIUM: 4.1 mmol/L (ref 3.5–5.1)
Sodium: 136 mmol/L (ref 135–145)

## 2014-08-21 LAB — CLOSTRIDIUM DIFFICILE BY PCR: Toxigenic C. Difficile by PCR: NEGATIVE

## 2014-08-21 LAB — CBC
HEMATOCRIT: 31.9 % — AB (ref 39.0–52.0)
HEMOGLOBIN: 10.4 g/dL — AB (ref 13.0–17.0)
MCH: 31.4 pg (ref 26.0–34.0)
MCHC: 32.6 g/dL (ref 30.0–36.0)
MCV: 96.4 fL (ref 78.0–100.0)
Platelets: 187 10*3/uL (ref 150–400)
RBC: 3.31 MIL/uL — ABNORMAL LOW (ref 4.22–5.81)
RDW: 13.1 % (ref 11.5–15.5)
WBC: 13.1 10*3/uL — ABNORMAL HIGH (ref 4.0–10.5)

## 2014-08-21 LAB — TSH: TSH: 1.877 u[IU]/mL (ref 0.350–4.500)

## 2014-08-21 LAB — MAGNESIUM: MAGNESIUM: 1.5 mg/dL (ref 1.5–2.5)

## 2014-08-21 MED ORDER — ONDANSETRON HCL 4 MG PO TABS
4.0000 mg | ORAL_TABLET | Freq: Three times a day (TID) | ORAL | Status: DC | PRN
  Administered 2014-08-21: 4 mg via ORAL
  Filled 2014-08-21: qty 1

## 2014-08-21 MED ORDER — LINAGLIPTIN 5 MG PO TABS
5.0000 mg | ORAL_TABLET | Freq: Every day | ORAL | Status: DC
  Administered 2014-08-21 – 2014-08-27 (×7): 5 mg via ORAL
  Filled 2014-08-21 (×7): qty 1

## 2014-08-21 MED ORDER — LATANOPROST 0.005 % OP SOLN
1.0000 [drp] | Freq: Every day | OPHTHALMIC | Status: DC
  Administered 2014-08-21 – 2014-08-26 (×7): 1 [drp] via OPHTHALMIC
  Filled 2014-08-21: qty 2.5

## 2014-08-21 MED ORDER — DEXTROSE 5 % IV SOLN
1.0000 g | INTRAVENOUS | Status: DC
  Administered 2014-08-21 – 2014-08-26 (×6): 1 g via INTRAVENOUS
  Filled 2014-08-21 (×6): qty 10

## 2014-08-21 MED ORDER — PSYLLIUM 95 % PO PACK
1.0000 | PACK | Freq: Every day | ORAL | Status: DC
  Administered 2014-08-24: 1 via ORAL
  Filled 2014-08-21 (×7): qty 1

## 2014-08-21 MED ORDER — CALCITRIOL 0.25 MCG PO CAPS
0.2500 ug | ORAL_CAPSULE | Freq: Every day | ORAL | Status: DC
  Administered 2014-08-21 – 2014-08-27 (×7): 0.25 ug via ORAL
  Filled 2014-08-21 (×7): qty 1

## 2014-08-21 MED ORDER — ASPIRIN 325 MG PO TABS
325.0000 mg | ORAL_TABLET | Freq: Every day | ORAL | Status: DC
  Administered 2014-08-21 – 2014-08-23 (×3): 325 mg via ORAL
  Filled 2014-08-21 (×3): qty 1

## 2014-08-21 MED ORDER — ACETAMINOPHEN 325 MG PO TABS: 650.0000 mg | ORAL_TABLET | Freq: Four times a day (QID) | ORAL | Status: DC | PRN

## 2014-08-21 MED ORDER — BOOST / RESOURCE BREEZE PO LIQD
1.0000 | Freq: Two times a day (BID) | ORAL | Status: DC
  Administered 2014-08-21 – 2014-08-27 (×8): 1 via ORAL

## 2014-08-21 MED ORDER — SODIUM CHLORIDE 0.9 % IV SOLN
INTRAVENOUS | Status: DC
  Administered 2014-08-21 (×2): via INTRAVENOUS

## 2014-08-21 MED ORDER — FLUTICASONE PROPIONATE HFA 110 MCG/ACT IN AERO
1.0000 | INHALATION_SPRAY | Freq: Every day | RESPIRATORY_TRACT | Status: DC
  Administered 2014-08-22 – 2014-08-26 (×4): 1 via RESPIRATORY_TRACT
  Filled 2014-08-21: qty 12

## 2014-08-21 MED ORDER — OMEGA-3-ACID ETHYL ESTERS 1 G PO CAPS
1.0000 g | ORAL_CAPSULE | Freq: Two times a day (BID) | ORAL | Status: DC
  Administered 2014-08-21 – 2014-08-27 (×13): 1 g via ORAL
  Filled 2014-08-21 (×14): qty 1

## 2014-08-21 MED ORDER — ACETAMINOPHEN 650 MG RE SUPP: 650.0000 mg | Freq: Four times a day (QID) | RECTAL | Status: DC | PRN

## 2014-08-21 MED ORDER — LORATADINE 10 MG PO TABS
10.0000 mg | ORAL_TABLET | Freq: Every day | ORAL | Status: DC
  Administered 2014-08-21 – 2014-08-27 (×7): 10 mg via ORAL
  Filled 2014-08-21 (×7): qty 1

## 2014-08-21 MED ORDER — TAMSULOSIN HCL 0.4 MG PO CAPS
0.4000 mg | ORAL_CAPSULE | Freq: Every day | ORAL | Status: DC
  Administered 2014-08-21 – 2014-08-26 (×6): 0.4 mg via ORAL
  Filled 2014-08-21 (×7): qty 1

## 2014-08-21 MED ORDER — PANTOPRAZOLE SODIUM 40 MG PO TBEC
40.0000 mg | DELAYED_RELEASE_TABLET | Freq: Every day | ORAL | Status: DC
  Administered 2014-08-21 – 2014-08-27 (×7): 40 mg via ORAL
  Filled 2014-08-21 (×7): qty 1

## 2014-08-21 MED ORDER — VITAMIN D3 25 MCG (1000 UNIT) PO TABS
1000.0000 [IU] | ORAL_TABLET | Freq: Every day | ORAL | Status: DC
  Administered 2014-08-21 – 2014-08-27 (×7): 1000 [IU] via ORAL
  Filled 2014-08-21 (×7): qty 1

## 2014-08-21 MED ORDER — GABAPENTIN 300 MG PO CAPS
300.0000 mg | ORAL_CAPSULE | Freq: Two times a day (BID) | ORAL | Status: DC
  Administered 2014-08-21 – 2014-08-27 (×12): 300 mg via ORAL
  Filled 2014-08-21 (×13): qty 1

## 2014-08-21 MED ORDER — ENSURE COMPLETE PO LIQD
237.0000 mL | Freq: Two times a day (BID) | ORAL | Status: DC
  Administered 2014-08-21: 237 mL via ORAL

## 2014-08-21 MED ORDER — LEVOTHYROXINE SODIUM 50 MCG PO TABS
50.0000 ug | ORAL_TABLET | Freq: Every day | ORAL | Status: DC
  Administered 2014-08-21 – 2014-08-27 (×7): 50 ug via ORAL
  Filled 2014-08-21 (×7): qty 1

## 2014-08-21 MED ORDER — DONEPEZIL HCL 10 MG PO TABS
10.0000 mg | ORAL_TABLET | Freq: Every day | ORAL | Status: DC
  Administered 2014-08-21 – 2014-08-26 (×7): 10 mg via ORAL
  Filled 2014-08-21 (×7): qty 1

## 2014-08-21 MED ORDER — TRAMADOL HCL 50 MG PO TABS: 50.0000 mg | ORAL_TABLET | Freq: Four times a day (QID) | ORAL | Status: DC | PRN

## 2014-08-21 MED ORDER — SIMVASTATIN 40 MG PO TABS
40.0000 mg | ORAL_TABLET | Freq: Every day | ORAL | Status: DC
  Administered 2014-08-21 – 2014-08-26 (×7): 40 mg via ORAL
  Filled 2014-08-21 (×7): qty 1

## 2014-08-21 MED ORDER — GLIMEPIRIDE 4 MG PO TABS
4.0000 mg | ORAL_TABLET | Freq: Two times a day (BID) | ORAL | Status: DC
  Administered 2014-08-21 – 2014-08-23 (×5): 4 mg via ORAL
  Filled 2014-08-21 (×6): qty 1

## 2014-08-21 MED ORDER — MECLIZINE HCL 25 MG PO TABS
25.0000 mg | ORAL_TABLET | Freq: Every day | ORAL | Status: DC | PRN
  Filled 2014-08-21: qty 1

## 2014-08-21 MED ORDER — ONDANSETRON HCL 4 MG/2ML IJ SOLN
4.0000 mg | Freq: Four times a day (QID) | INTRAMUSCULAR | Status: DC | PRN
  Administered 2014-08-23: 4 mg via INTRAVENOUS
  Filled 2014-08-21: qty 2

## 2014-08-21 MED ORDER — SODIUM BICARBONATE 650 MG PO TABS
650.0000 mg | ORAL_TABLET | Freq: Three times a day (TID) | ORAL | Status: DC
  Administered 2014-08-21 – 2014-08-27 (×18): 650 mg via ORAL
  Filled 2014-08-21 (×20): qty 1

## 2014-08-21 MED ORDER — HEPARIN SODIUM (PORCINE) 5000 UNIT/ML IJ SOLN
5000.0000 [IU] | Freq: Three times a day (TID) | INTRAMUSCULAR | Status: DC
  Administered 2014-08-21 – 2014-08-22 (×6): 5000 [IU] via SUBCUTANEOUS
  Filled 2014-08-21 (×9): qty 1

## 2014-08-21 NOTE — Evaluation (Addendum)
Physical Therapy Evaluation Patient Details Name: Patrick Boyle MRN: [id number] DOB: 03-Aug-1934 Today's Date: 08/21/2014   History of Present Illness  80 yo male admitted with sepsis due to UTI. Recent admit ~08/11/14 for N/V/D, acute renal failure. hx of periphernal neuropathy.   Clinical Impression  On eval, pt required Min assist at times for ambulation (Min guard mostly) with use of IV pole for support to simulate cane use. Pt states he uses cane at baseline. Noted some unsteadiness and decreased activity tolerance during session. Recommend HHPT.     Follow Up Recommendations Home health PT    Equipment Recommendations  Rolling walker with 5" wheels (possibly-will continue to assess)    Recommendations for Other Services       Precautions / Restrictions Precautions Precautions: Fall Restrictions Weight Bearing Restrictions: No      Mobility  Bed Mobility Overal bed mobility: Modified Independent                Transfers Overall transfer level: Needs assistance   Transfers: Sit to/from Stand Sit to Stand: From elevated surface;Min guard         General transfer comment: close guard for safety. Mild posterior leaning noted on initial standing.  Ambulation/Gait Ambulation/Gait assistance: Min assist Ambulation Distance (Feet): 100 Feet Assistive device:  (IV pole) Gait Pattern/deviations: Step-through pattern;Decreased stride length     General Gait Details: small amount of assist to steady intermittently. fatigues fairly easily. Will need to assess ambulation with straight cane on next visit-could potentially benefit from walker use.  Stairs            Wheelchair Mobility    Modified Rankin (Stroke Patients Only)       Balance           Standing balance support: No upper extremity supported;During functional activity Standing balance-Leahy Scale: Fair                               Pertinent Vitals/Pain Pain Assessment:  No/denies pain Checked with RN Jeannene Patella) before session-okay to proceed with eval Start of session:NSR, HR 64 bpm End of session: NSR, HR 82 bpm    Home Living Family/patient expects to be discharged to:: Private residence Living Arrangements: Spouse/significant other   Type of Home: House Home Access: Stairs to enter   [employer] of Steps: 2 Home Layout: One [place]: Grab bars - toilet;Grab bars - tub/shower;Cane - single point;Walker - 2 wheels Additional Comments: pt wife is legally blind but she is pretty i per pt    Prior Function Level of Independence: Independent;Independent with assistive device(s)         Comments: uses cane at times   also furniture walks     Hand Dominance        Extremity/Trunk Assessment   Upper Extremity Assessment: Overall WFL for tasks assessed           Lower Extremity Assessment: Generalized weakness      Cervical / Trunk Assessment: Normal  Communication   Communication: No difficulties  Cognition Arousal/Alertness: Awake/alert Behavior During Therapy: WFL for tasks assessed/performed Overall Cognitive Status: Within Functional Limits for tasks assessed                      General Comments      Exercises        Assessment/Plan    PT Assessment Patient needs continued PT services  PT Diagnosis Difficulty walking;Generalized weakness   PT Problem List Decreased strength;Decreased activity tolerance;Decreased balance;Decreased mobility;Decreased knowledge of use of DME  PT Treatment Interventions DME instruction;Gait training;Functional mobility training;Therapeutic activities;Patient/family education;Therapeutic exercise;Balance training   PT Goals (Current goals can be found in the Care Plan section) Acute Rehab PT Goals Patient Stated Goal: feel better PT Goal Formulation: With patient Time For Goal Achievement: 09/04/14 Potential to Achieve Goals: Good    Frequency Min 3X/week    Barriers to discharge        Co-evaluation               End of Session Equipment Utilized During Treatment: Gait belt Activity Tolerance: Patient limited by fatigue Patient left: in bed;with call bell/phone within reach;with bed alarm set           Time: 1358-1411 PT Time Calculation (min) (ACUTE ONLY): 13 min   Charges:   PT Evaluation $Initial PT Evaluation Tier I: 1 Procedure PT Treatments $Gait Training: 8-22 mins   PT G Codes:          Weston Anna, MPT Pager: [phone]

## 2014-08-21 NOTE — Care Management Note (Addendum)
    Page 1 of 1   08/21/2014     4:26:45 PM CARE MANAGEMENT NOTE 08/21/2014  Patient:  Patrick Boyle,Patrick Boyle   Account Number:  [id number]  Date Initiated:  08/21/2014  Documentation initiated by:  Dessa Phi  Subjective/Objective Assessment:   80 y/o m admitted w/sepsis-uti.Readmit-12/9-12/13-aki/pbstructive uropathy.     Action/Plan:   From home.   Anticipated DC Date:  08/24/2014   Anticipated DC Plan:  [place]  CM consult      Choice offered to / List presented to:             Status of service:  In process, will continue to follow Medicare Important Message given?   (If response is "NO", the following Medicare IM given date fields will be blank) Date Medicare IM given:   Medicare IM given by:   Date Additional Medicare IM given:   Additional Medicare IM given by:    Discharge Disposition:    Per UR Regulation:  Reviewed for med. necessity/level of care/duration of stay  If discussed at [place] of Stay Meetings, dates discussed:    Comments:  08/21/14 Dessa Phi RN BSN NCM [place].Kristen awareAwait PT recommendations.

## 2014-08-21 NOTE — Progress Notes (Signed)
Patient started having diarrhea once up on the floor. C diff sample taken down to lab and patient placed on enteric precautions.

## 2014-08-21 NOTE — Progress Notes (Signed)
INITIAL NUTRITION ASSESSMENT  DOCUMENTATION CODES Per approved criteria  -Not Applicable   INTERVENTION: - Resource Breeze po BID, each supplement provides 250 kcal and 9 grams of protein  NUTRITION DIAGNOSIS: Inadequate oral intake related to diarrhea as evidenced by poor po.   Goal: Pt to meet >/= 90% of their estimated nutrition needs   Monitor:  Weight trend, po intake, acceptance of supplements, labs  Reason for Assessment: MST  80 y.o. male  Admitting Dx: Sepsis secondary to UTI  ASSESSMENT: 80 y.o. male who was just admitted last week with AKI on CKD caused by obstructive uropathy due to enlarged prostate. Had catheter placement during that admission which improved his creatinine down from 3.4 on admission to 2.8 on discharge. Catheter was removed a couple of days ago. Over past 24 hours has developed generalized weakness, fever.    - Pt reports poor po intake due to diarrhea. He "does not want to eat anything to make it worse." RN brought pt Ensure supplement which pt does not want to drink because he does not want to have more diarrhea. Pt agreed to try [employer]. Pt with no recent weight loss. Reports that appetite was good prior to admission. Pt has not has anything to eat today. - No signs of fat or muscle depletion.  Labs: Na and K WNL BUN elevated Height: Ht Readings from Last 1 Encounters:  08/21/14 5\' 11"  (1.803 m)    Weight: Wt Readings from Last 1 Encounters:  08/21/14 198 lb 6.6 oz (90 kg)    Ideal Body Weight: 75.3 kg  % Ideal Body Weight: 120%  Wt Readings from Last 10 Encounters:  08/21/14 198 lb 6.6 oz (90 kg)  08/08/14 195 lb 5.2 oz (88.6 kg)  02/22/14 187 lb (84.823 kg)  09/20/13 192 lb (87.091 kg)  08/18/13 190 lb (86.183 kg)  04/03/13 190 lb 9.6 oz (86.456 kg)  04/18/12 190 lb 12.8 oz (86.546 kg)  11/13/11 174 lb (78.926 kg)  10/30/11 192 lb (87.091 kg)  05/13/11 195 lb (88.451 kg)    BMI:  Body mass index is 27.69  kg/(m^2).  Estimated Nutritional Needs: Kcal: 2200-2400 Protein: 115-130 g Fluid: 2.4 L/day  Skin: Intact  Diet Order: Diet regular  EDUCATION NEEDS: -Education needs addressed   Intake/Output Summary (Last 24 hours) at 08/21/14 1446 Last data filed at 08/21/14 1349  Gross per 24 hour  Intake  257.5 ml  Output   1025 ml  Net -767.5 ml    Last BM: 12/22   Labs:   Recent Labs Lab 08/20/14 2133 08/21/14 0500  NA 137 136  K 4.5 4.1  CL 103 108  CO2 19 18*  BUN 36* 37*  CREATININE 2.70* 2.79*  CALCIUM 9.0 8.4  GLUCOSE 114* 137*    CBG (last 3)  No results for input(s): GLUCAP in the last 72 hours.  Scheduled Meds: . aspirin  325 mg Oral Daily  . calcitRIOL  0.25 mcg Oral Daily  . cefTRIAXone (ROCEPHIN)  IV  1 g Intravenous Q24H  . cholecalciferol  1,000 Units Oral Daily  . donepezil  10 mg Oral QHS  . feeding supplement (ENSURE COMPLETE)  237 mL Oral BID BM  . fluticasone  1 puff Inhalation Daily  . gabapentin  300 mg Oral BID  . glimepiride  4 mg Oral BID  . heparin  5,000 Units Subcutaneous 3 times per day  . latanoprost  1 drop Both Eyes QHS  . levothyroxine  50 mcg  Oral QAC breakfast  . linagliptin  5 mg Oral Daily  . loratadine  10 mg Oral Daily  . omega-3 acid ethyl esters  1 g Oral BID  . pantoprazole  40 mg Oral Daily  . psyllium  1 packet Oral Daily  . simvastatin  40 mg Oral QHS  . sodium bicarbonate  650 mg Oral TID  . tamsulosin  0.4 mg Oral QPC supper    Continuous Infusions: . sodium chloride 50 mL/hr at 08/21/14 2355    Past Medical History  Diagnosis Date  . Diabetes mellitus     dr Olen Pel  . Hyperlipidemia   . Hypertension   . Coronary artery disease     s/p CABG 1997  . GERD (gastroesophageal reflux disease)   . Arthritis   . Allergic rhinitis   . Hyperplastic colon polyp   . Diverticulitis, colon   . Meniere's syndrome   . Tremor, essential     dr Jannifer Franklin  . COPD, mild     dr clance  . MI (myocardial infarction)  1997, 2005  . Ischemic cardiomyopathy     sp ICD (SJM) implanted by Dr Rayann Heman    Past Surgical History  Procedure Laterality Date  . Coronary artery bypass graft  [place]  . Cardiac catheterization  2005    medical therapy  . Cardiac defibrillator placement      ICD implanted by Dr Rayann Heman, Analyze ST study patient    Laurette Schimke MS, RD, LDN

## 2014-08-21 NOTE — Progress Notes (Addendum)
Pt had an episode of emesis. He had been c/o nausea this am and had not eaten anything and only drank water with meds this am. PO zofran given per MD orders. A short while later, pt called out and stated he had vomited. Upon entering room, found clear emesis all over the floor. Pt was being cleaned up by tech and RN received a phone call from central tele that pt had converted to [place]. RN reviewed rhythm on monitor and confirmed that it was a flutter. EKG obtained for final verification. BP 142/70 manually checked by RN, pulse 70's on telemetry monitor. Pt has no hx of afib/aflutter. Pt has no complaints of chest pain, SOB, or dizziness at this time. Pt stated his nausea was relieved after the emesis episode. MD paged to make aware of new cardiac rhythm. Awaiting further instructions. Will continue to monitor pt for changes.   Othella Boyer [facility]  08/21/2014 12:53 PM   MD called back. Pt stable at this time. Pt has a defibrillator in place. Labs ordered. Will continue to monitor.  Othella Boyer Kaysia Willard 1:20 PM

## 2014-08-21 NOTE — H&P (Signed)
[facility] History and Physical  Patrick Boyle GXQ:[id number] DOB: 01/29/1934 DOA: 08/20/2014  Referring physician: EDP PCP: Abigail Miyamoto, MD   Chief Complaint: Fever   HPI: Patrick Boyle is a 80 y.o. male who was just admitted last week with AKI on CKD caused by obstructive uropathy due to enlarged prostate.  Had catheter placement during that admission which improved his creatinine down from 3.4 on admission to 2.8 on discharge.  Catheter was removed a couple of days ago.  Over past 24 hours has developed generalized weakness, fever.  No associated chest pain, nausea, SOB, vomiting, diarrhea, headache, back pain.  Does have some associated abdominal pain.  Nothing seems to make symptoms better or worse.  W/U in ED confirmed presence of UTI.  Review of Systems: Systems reviewed.  As above, otherwise negative  Past Medical History  Diagnosis Date  . Diabetes mellitus     dr Olen Pel  . Hyperlipidemia   . Hypertension   . Coronary artery disease     s/p CABG 1997  . GERD (gastroesophageal reflux disease)   . Arthritis   . Allergic rhinitis   . Hyperplastic colon polyp   . Diverticulitis, colon   . Meniere's syndrome   . Tremor, essential     dr Jannifer Franklin  . COPD, mild     dr clance  . MI (myocardial infarction) 1997, 2005  . Ischemic cardiomyopathy     sp ICD (SJM) implanted by Dr Rayann Heman   Past Surgical History  Procedure Laterality Date  . Coronary artery bypass graft  [place]  . Cardiac catheterization  2005    medical therapy  . Cardiac defibrillator placement      ICD implanted by Dr Rayann Heman, Analyze ST study patient   Social History:  reports that he quit smoking about 18 years ago. His smoking use included Cigarettes. He smoked 0.00 packs per day for 35 years. He does not have any smokeless tobacco history on file. He reports that he drinks alcohol. He reports that he does not use illicit drugs.  Allergies  Allergen Reactions  . Metformin And Related  Other (See Comments)    GI sensitivity with high doses  . Ramipril Other (See Comments)    Cough    Family History  Problem Relation Age of Onset  . Parkinsonism Father   . Stomach cancer Mother   . Diabetes Mother      Prior to Admission medications   Medication Sig Start Date End Date Taking? Authorizing Provider  Acetaminophen (ARTHRITIS PAIN RELIEF PO) Take 650 mg by mouth 2 (two) times daily.    Yes Historical Provider, MD  aspirin 325 MG tablet Take 325 mg by mouth daily.     Yes Historical Provider, MD  bimatoprost (LUMIGAN) 0.03 % ophthalmic solution Place 1 drop into both eyes at bedtime.    Yes Historical Provider, MD  calcitRIOL (ROCALTROL) 0.25 MCG capsule Take 0.25 mcg by mouth daily.   Yes Historical Provider, MD  cetirizine (ZYRTEC) 10 MG tablet Take 5 mg by mouth daily.     Yes Historical Provider, MD  cholecalciferol (VITAMIN D) 1000 UNITS tablet Take 1,000 Units by mouth daily.   Yes Historical Provider, MD  donepezil (ARICEPT) 10 MG tablet Take 10 mg by mouth at bedtime.   Yes Historical Provider, MD  fish oil-omega-3 fatty acids 1000 MG capsule Take 2 g by mouth 2 (two) times daily.    Yes Historical Provider, MD  Fluticasone Propionate,  Inhal, (FLOVENT DISKUS) 100 MCG/BLIST AEPB Inhale 1 puff into the lungs daily.    Yes Historical Provider, MD  gabapentin (NEURONTIN) 300 MG capsule Take 1 capsule (300 mg total) by mouth 2 (two) times daily. 08/12/14  Yes Orson Eva, MD  glimepiride (AMARYL) 4 MG tablet Take 4 mg by mouth 2 (two) times daily.     Yes Historical Provider, MD  lansoprazole (PREVACID) 30 MG capsule Take 30 mg by mouth daily as needed (for acid reflex).    Yes Historical Provider, MD  levothyroxine (SYNTHROID, LEVOTHROID) 50 MCG tablet Take 50 mcg by mouth daily before breakfast.   Yes Historical Provider, MD  meclizine (ANTIVERT) 25 MG tablet Take 25 mg by mouth daily as needed (for vertigo).    Yes Historical Provider, MD  psyllium (REGULOID) 0.52 G  capsule Take 0.52 g by mouth daily.   Yes Historical Provider, MD  simvastatin (ZOCOR) 40 MG tablet Take 40 mg by mouth at bedtime.     Yes Historical Provider, MD  sitaGLIPtin (JANUVIA) 50 MG tablet Take 50 mg by mouth daily with breakfast.   Yes Historical Provider, MD  sodium bicarbonate 650 MG tablet Take 1 tablet (650 mg total) by mouth 2 (two) times daily. Patient taking differently: Take 650 mg by mouth 3 (three) times daily.  08/12/14  Yes Orson Eva, MD  tamsulosin (FLOMAX) 0.4 MG CAPS capsule Take 1 capsule (0.4 mg total) by mouth daily after supper. 08/12/14  Yes Orson Eva, MD  traMADol (ULTRAM) 50 MG tablet Take 1 tablet by mouth every 6 (six) hours as needed. For 10 days 08/12/14  Yes Historical Provider, MD  furosemide (LASIX) 20 MG tablet Take 20 mg by mouth every other day.     Historical Provider, MD   Physical Exam: Filed Vitals:   08/20/14 2230  BP: 141/75  Pulse: 65  Temp:   Resp: 20    BP 141/75 mmHg  Pulse 65  Temp(Src) 101.6 F (38.7 C) (Rectal)  Resp 20  Ht 5\' 11"  (1.803 m)  Wt 86.183 kg (190 lb)  BMI 26.51 kg/m2  SpO2 99%  General Appearance:    Alert, oriented, no distress, appears stated age  Head:    Normocephalic, atraumatic  Eyes:    PERRL, EOMI, sclera non-icteric        Nose:   Nares without drainage or epistaxis. Mucosa, turbinates normal  Throat:   Moist mucous membranes. Oropharynx without erythema or exudate.  Neck:   Supple. No carotid bruits.  No thyromegaly.  No lymphadenopathy.   Back:     No CVA tenderness, no spinal tenderness  Lungs:     Clear to auscultation bilaterally, without wheezes, rhonchi or rales  Chest wall:    No tenderness to palpitation  Heart:    Regular rate and rhythm without murmurs, gallops, rubs  Abdomen:     Soft, non-tender, nondistended, normal bowel sounds, no organomegaly  Genitalia:    deferred  Rectal:    deferred  Extremities:   No clubbing, cyanosis or edema.  Pulses:   2+ and symmetric all extremities   Skin:   Skin color, texture, turgor normal, no rashes or lesions  Lymph nodes:   Cervical, supraclavicular, and axillary nodes normal  Neurologic:   CNII-XII intact. Normal strength, sensation and reflexes      throughout    Labs on Admission:  Basic Metabolic Panel:  Recent Labs Lab 08/20/14 2133  NA 137  K 4.5  CL 103  CO2  19  GLUCOSE 114*  BUN 36*  CREATININE 2.70*  CALCIUM 9.0   Liver Function Tests:  Recent Labs Lab 08/20/14 2133  AST 20  ALT 21  ALKPHOS 59  BILITOT 0.3  PROT 6.7  ALBUMIN 3.1*   No results for input(s): LIPASE, AMYLASE in the last 168 hours. No results for input(s): AMMONIA in the last 168 hours. CBC:  Recent Labs Lab 08/20/14 2133  WBC 13.0*  NEUTROABS 10.2*  HGB 10.0*  HCT 30.6*  MCV 95.3  PLT 187   Cardiac Enzymes: No results for input(s): CKTOTAL, CKMB, CKMBINDEX, TROPONINI in the last 168 hours.  BNP (last 3 results) No results for input(s): PROBNP in the last 8760 hours. CBG: No results for input(s): GLUCAP in the last 168 hours.  Radiological Exams on Admission: Ct Abdomen Pelvis Wo Contrast  08/20/2014   CLINICAL DATA:  Acute onset of generalized weakness for 4 days. Fever. Initial encounter.  EXAM: CT ABDOMEN AND PELVIS WITHOUT CONTRAST  TECHNIQUE: Multidetector CT imaging of the abdomen and pelvis was performed following the standard protocol without IV contrast.  COMPARISON:  Renal ultrasound performed 08/08/2014  FINDINGS: Minimal right basilar atelectasis is noted. Diffuse coronary artery calcification is noted. A pacemaker lead is partially imaged.  The liver and spleen are unremarkable in appearance. The patient is status post cholecystectomy, with clips noted at the gallbladder fossa. The pancreas and adrenal glands are unremarkable.  Nonspecific perinephric stranding is noted bilaterally. The kidneys are otherwise unremarkable. There is no evidence of hydronephrosis. No renal or ureteral stones are seen.  No free fluid  is identified. The small bowel is unremarkable in appearance. The stomach is within normal limits. No acute vascular abnormalities are seen. Scattered calcification is seen along the abdominal aorta and its branches. There is borderline ectasia of the distal abdominal aorta.  The appendix unremarkable in appearance, though difficult to fully assess. There is no evidence for appendicitis. The colon is largely filled with fluid. Diffuse diverticulosis is noted along the descending and sigmoid colon, without evidence for diverticulitis.  The bladder is mildly distended. Scattered foci of air are seen within the bladder, of uncertain significance. There is focal anterior bladder wall thickening, and several small stones are seen dependently within the bladder. Would correlate for recent Foley catheter placement. The prostate is enlarged, measuring 5.6 cm in transverse dimension. No inguinal lymphadenopathy is seen.  No acute osseous abnormalities are identified. The patient's right hip arthroplasty is grossly unremarkable in appearance, though incompletely imaged. Degenerative joint space narrowing and subcortical cystic change is noted at the left hip joint.  IMPRESSION: 1. Scattered foci of air noted within the bladder, of uncertain significance. Would correlate for recent Foley catheter placement. If no Foley catheter has recently been placed, emphysematous cystitis could have such an appearance. 2. Focal anterior bladder wall thickening, and several small stones dependently within the bladder. An underlying mass cannot be excluded. Would consider cystoscopy for further evaluation, when and as deemed clinically appropriate. 3. Enlarged prostate noted. 4. Scattered calcification along the abdominal aorta and its branches. 5. Diffuse diverticulosis along the descending and sigmoid colon, without evidence of diverticulitis. 6. Diffuse coronary artery calcification noted. 7. Degenerative joint space narrowing and  subcortical cystic change at the left hip.   Electronically Signed   By: Garald Balding M.D.   On: 08/20/2014 23:48    EKG: Independently reviewed.  Assessment/Plan Principal Problem:   Sepsis secondary to UTI Active Problems:   Chronic systolic congestive heart  failure   Acute on chronic renal failure   1. Sepsis secondary to UTI - catheter associated UTI 1. Rocephin 2. Cultures pending 3. Bladder scan at 0600, in and out cath PRN if having urinary retention. 4. Tylenol PRN fever. 2. CKD - creatinine improved to 2.7 from 3.4 on admission last week and 2.8 on discharge last week. 1. Likely represents CKD at this point 2. Continue home meds including bicarb 3. Continue to hold lasix 4. Gentle hydration given sepsis with NS at 50 cc/hr 5. Monitor intake and output.    Code Status: Full  Family Communication: Family at bedside Disposition Plan: Admit to inpatient   Time spent: 78 min  GARDNER, JARED M. [facility] Pager [phone]  If 7AM-7PM, please contact the day team taking care of the patient [email or web address] Password [facility] 08/21/2014, 12:39 AM

## 2014-08-21 NOTE — Progress Notes (Signed)
[facility]  Patient Status: Active (receiving services up to time of hospitalization)  [facility] is providing the following services: RN and PT  If patient discharges after hours, please call [phone].   Patrick Boyle 08/21/2014, 12:00 PM

## 2014-08-21 NOTE — Progress Notes (Signed)
[facility]                                                                              Patient Demographics  Patrick Boyle, is a 80 y.o. male, DOB - 30-Oct-1933, [id number]  Admit date - 08/20/2014   Admitting Physician Etta Quill, DO  Outpatient Primary MD for the patient is FRIED, Jaymes Graff, MD  LOS - 1   Chief Complaint  Patient presents with  . Fever  . Weakness      HPI on 08/21/2014 by Dr. Jennette Kettle Patrick Boyle is a 80 y.o. male who was just admitted last week with AKI on CKD caused by obstructive uropathy due to enlarged prostate. Had catheter placement during that admission which improved his creatinine down from 3.4 on admission to 2.8 on discharge. Catheter was removed a couple of days ago. Over past 24 hours has developed generalized weakness, fever. No associated chest pain, nausea, SOB, vomiting, diarrhea, headache, back pain. Does have some associated abdominal pain. Nothing seems to make symptoms better or worse.  W/U in ED confirmed presence of UTI.  Assessment & Plan   Sepsis secondary UTI -Patient was febrile with leukocytosis upon admission -Will continue ceftriaxone -Pending urine cultures -Patient had foley, which was removed last week, possibly catheter associated  New Atrial Flutter -Patient had episode of emesis, followed by Atrial flutter -Currently rate controlled -Will obtain TSH and Mag levels -Will also have ICD interrogated   Nausea/Vomiting -Likely secondary to sepsis -Will order antiemetics   Urinary retention -Will replace foley catheter -Spoke with Dr. Diona Fanti, who recommended discharging patient with foley and he or a partner will see after discharge -Continue flomax  Chronic Kidney Disease, Stage IV -Likely at baseline, at previous hospital admission was 3.4 and trended down to 2.8 upon discharge -Continue to monitor closely  Diabetes Mellitus, type 2 with neuropathy -Continue amaryl and tradjenta with  CBG monitoring -Continue gabapentin for neuropathy  Hyperlipidemia -Continue statin  GERD -Continue PPI  Chronic systolic CHF -Has ICD in place (2012) -Continue to monitor intake output and daily weights -Currently euvolemic and compensated  Code Status: Full  Family Communication: None at bedside  Disposition Plan: Admitted  Time Spent in minutes   30 minutes  Procedures  None  Consults   Dr. Diona Fanti, urology, via phone  DVT Prophylaxis  heparin  Lab Results  Component Value Date   PLT 187 08/21/2014    Medications  Scheduled Meds: . aspirin  325 mg Oral Daily  . calcitRIOL  0.25 mcg Oral Daily  . cefTRIAXone (ROCEPHIN)  IV  1 g Intravenous Q24H  . cholecalciferol  1,000 Units Oral Daily  . donepezil  10 mg Oral QHS  . feeding supplement (ENSURE COMPLETE)  237 mL Oral BID BM  . fluticasone  1 puff Inhalation Daily  . gabapentin  300 mg Oral BID  . glimepiride  4 mg Oral BID  . heparin  5,000 Units Subcutaneous 3 times per day  . latanoprost  1 drop Both Eyes QHS  . levothyroxine  50 mcg Oral QAC breakfast  . linagliptin  5 mg Oral Daily  . loratadine  10 mg Oral Daily  .  omega-3 acid ethyl esters  1 g Oral BID  . pantoprazole  40 mg Oral Daily  . psyllium  1 packet Oral Daily  . simvastatin  40 mg Oral QHS  . sodium bicarbonate  650 mg Oral TID  . tamsulosin  0.4 mg Oral QPC supper   Continuous Infusions: . sodium chloride 50 mL/hr at 08/21/14 0051   PRN Meds:.acetaminophen **OR** acetaminophen, meclizine, ondansetron (ZOFRAN) IV, ondansetron, traMADol  Antibiotics    Anti-infectives    Start     Dose/Rate Route Frequency Ordered Stop   08/21/14 2300  cefTRIAXone (ROCEPHIN) 1 g in dextrose 5 % 50 mL IVPB     1 g100 mL/hr over 30 Minutes Intravenous Every 24 hours 08/21/14 0014     08/20/14 2315  cefTRIAXone (ROCEPHIN) 1 g in dextrose 5 % 50 mL IVPB     1 g100 mL/hr over 30 Minutes Intravenous  Once 08/20/14 2306 08/20/14 2342       Subjective:   Patrick Boyle seen and examined today.  Patient states he feels somewhat nauseous.  He denies chest pain, shortness of breath.  He inquires about going home today.  Objective:   Filed Vitals:   08/20/14 2230 08/21/14 0132 08/21/14 0657 08/21/14 1243  BP: 141/75 145/61 148/64 142/70  Pulse: 65 67 78 72  Temp:  97.8 F (36.6 C) 97.8 F (36.6 C)   TempSrc:  Oral Oral   Resp: 20 20 20    Height:  5\' 11"  (1.803 m)    Weight:  90 kg (198 lb 6.6 oz)    SpO2: 99% 98% 97%     Wt Readings from Last 3 Encounters:  08/21/14 90 kg (198 lb 6.6 oz)  08/08/14 88.6 kg (195 lb 5.2 oz)  02/22/14 84.823 kg (187 lb)     Intake/Output Summary (Last 24 hours) at 08/21/14 1322 Last data filed at 08/21/14 1149  Gross per 24 hour  Intake  257.5 ml  Output    800 ml  Net -542.5 ml    Exam  General: Well developed, well nourished, NAD, appears stated age  HEENT: NCAT, mucous membranes moist.   Cardiovascular: S1 S2 auscultated, no rubs, murmurs or gallops. Regular rate and rhythm.  Respiratory: Clear to auscultation bilaterally with equal chest rise  Abdomen: Soft, nontender, nondistended, + bowel sounds  Extremities: warm dry without cyanosis clubbing or edema  Neuro: AAOx3, no focal deficits  Skin: Without rashes exudates or nodules  Psych: Normal affect and demeanor with intact judgement and insight  Data Review   Micro Results Recent Results (from the past 240 hour(s))  Clostridium Difficile by PCR     Status: None   Collection Time: 08/21/14  4:15 AM  Result Value Ref Range Status   C difficile by pcr NEGATIVE NEGATIVE Final    Comment: Performed at [facility]    Radiology Reports Ct Abdomen Pelvis Wo Contrast  08/20/2014   CLINICAL DATA:  Acute onset of generalized weakness for 4 days. Fever. Initial encounter.  EXAM: CT ABDOMEN AND PELVIS WITHOUT CONTRAST  TECHNIQUE: Multidetector CT imaging of the abdomen and pelvis was performed following  the standard protocol without IV contrast.  COMPARISON:  Renal ultrasound performed 08/08/2014  FINDINGS: Minimal right basilar atelectasis is noted. Diffuse coronary artery calcification is noted. A pacemaker lead is partially imaged.  The liver and spleen are unremarkable in appearance. The patient is status post cholecystectomy, with clips noted at the gallbladder fossa. The pancreas and adrenal glands are  unremarkable.  Nonspecific perinephric stranding is noted bilaterally. The kidneys are otherwise unremarkable. There is no evidence of hydronephrosis. No renal or ureteral stones are seen.  No free fluid is identified. The small bowel is unremarkable in appearance. The stomach is within normal limits. No acute vascular abnormalities are seen. Scattered calcification is seen along the abdominal aorta and its branches. There is borderline ectasia of the distal abdominal aorta.  The appendix unremarkable in appearance, though difficult to fully assess. There is no evidence for appendicitis. The colon is largely filled with fluid. Diffuse diverticulosis is noted along the descending and sigmoid colon, without evidence for diverticulitis.  The bladder is mildly distended. Scattered foci of air are seen within the bladder, of uncertain significance. There is focal anterior bladder wall thickening, and several small stones are seen dependently within the bladder. Would correlate for recent Foley catheter placement. The prostate is enlarged, measuring 5.6 cm in transverse dimension. No inguinal lymphadenopathy is seen.  No acute osseous abnormalities are identified. The patient's right hip arthroplasty is grossly unremarkable in appearance, though incompletely imaged. Degenerative joint space narrowing and subcortical cystic change is noted at the left hip joint.  IMPRESSION: 1. Scattered foci of air noted within the bladder, of uncertain significance. Would correlate for recent Foley catheter placement. If no Foley  catheter has recently been placed, emphysematous cystitis could have such an appearance. 2. Focal anterior bladder wall thickening, and several small stones dependently within the bladder. An underlying mass cannot be excluded. Would consider cystoscopy for further evaluation, when and as deemed clinically appropriate. 3. Enlarged prostate noted. 4. Scattered calcification along the abdominal aorta and its branches. 5. Diffuse diverticulosis along the descending and sigmoid colon, without evidence of diverticulitis. 6. Diffuse coronary artery calcification noted. 7. Degenerative joint space narrowing and subcortical cystic change at the left hip.   Electronically Signed   By: Garald Balding M.D.   On: 08/20/2014 23:48   US Renal  08/08/2014   CLINICAL DATA:  80 year old male with acute renal failure. Initial encounter. Current history of Chronic kidney disease.  EXAM: RENAL/URINARY TRACT ULTRASOUND COMPLETE  COMPARISON:  05/15/2014.  FINDINGS: Right Kidney:  Length: 9.5 cm. Unchanged upper pole simple cyst measuring up to 2.0 cm. Echogenicity Stable and within normal limits. No mass or hydronephrosis visualized.  Left Kidney:  Length: 9.7 cm. Echogenicity Stable and within normal limits. No mass or hydronephrosis visualized.  Bladder:  Appears normal for degree of bladder distention. Bilateral ureteral jets detected.  IMPRESSION: Stable sonographic appearance of the kidneys since September, no obstructive uropathy.   Electronically Signed   By: Lars Pinks M.D.   On: 08/08/2014 13:24   Dg Chest Port 1 View  08/08/2014   CLINICAL DATA:  80 year old male with fever weakness and diarrhea for 4 days. Initial encounter.  EXAM: PORTABLE CHEST - 1 VIEW  COMPARISON:  07/21/2013 and earlier.  FINDINGS: Portable AP upright view at 1018 hrs. Stable left chest single lead cardiac AICD. Sequelae of median sternotomy appears stable. Stable cardiomegaly and mediastinal contours. Lung volumes not significantly changed with no  pneumothorax, pulmonary edema, pleural effusion or confluent pulmonary opacity.  IMPRESSION: No acute cardiopulmonary abnormality.   Electronically Signed   By: Lars Pinks M.D.   On: 08/08/2014 10:29    CBC  Recent Labs Lab 08/20/14 2133 08/21/14 0500  WBC 13.0* 13.1*  HGB 10.0* 10.4*  HCT 30.6* 31.9*  PLT 187 187  MCV 95.3 96.4  MCH 31.2 31.4  MCHC  32.7 32.6  RDW 12.9 13.1  LYMPHSABS 1.7  --   MONOABS 0.9  --   EOSABS 0.2  --   BASOSABS 0.0  --     Chemistries   Recent Labs Lab 08/20/14 2133 08/21/14 0500  NA 137 136  K 4.5 4.1  CL 103 108  CO2 19 18*  GLUCOSE 114* 137*  BUN 36* 37*  CREATININE 2.70* 2.79*  CALCIUM 9.0 8.4  AST 20  --   ALT 21  --   ALKPHOS 59  --   BILITOT 0.3  --    ------------------------------------------------------------------------------------------------------------------ estimated creatinine clearance is 23.2 mL/min (by C-G formula based on Cr of 2.79). ------------------------------------------------------------------------------------------------------------------ No results for input(s): HGBA1C in the last 72 hours. ------------------------------------------------------------------------------------------------------------------ No results for input(s): CHOL, HDL, LDLCALC, TRIG, CHOLHDL, LDLDIRECT in the last 72 hours. ------------------------------------------------------------------------------------------------------------------ No results for input(s): TSH, T4TOTAL, T3FREE, THYROIDAB in the last 72 hours.  Invalid input(s): FREET3 ------------------------------------------------------------------------------------------------------------------ No results for input(s): VITAMINB12, FOLATE, FERRITIN, TIBC, IRON, RETICCTPCT in the last 72 hours.  Coagulation profile No results for input(s): INR, PROTIME in the last 168 hours.  No results for input(s): DDIMER in the last 72 hours.  Cardiac Enzymes No results for input(s): CKMB,  TROPONINI, MYOGLOBIN in the last 168 hours.  Invalid input(s): CK ------------------------------------------------------------------------------------------------------------------ Invalid input(s): POCBNP    Melady Chow D.O. on 08/21/2014 at 1:22 PM  Between 7am to 7pm - Pager - [phone]  After 7pm go to [email or web address] - password [email or web address]  And look for the night coverage person covering for me after hours  [facility] Office  [phone]

## 2014-08-21 NOTE — Plan of Care (Signed)
Problem: Phase I Progression Outcomes Goal: Voiding-avoid urinary catheter unless indicated Outcome: Not Progressing Pt unable to void d/t enlarged prostate. Coude foley catheter placed. Primary MD has consulted Urology.

## 2014-08-21 NOTE — Progress Notes (Signed)
Patient unable to urinate at all. Bladder scan done and it showed 375cc. RN did an in and out cath and got out 400cc. Will report to day RN.

## 2014-08-22 DIAGNOSIS — N179 Acute kidney failure, unspecified: Secondary | ICD-10-CM

## 2014-08-22 DIAGNOSIS — I5022 Chronic systolic (congestive) heart failure: Secondary | ICD-10-CM

## 2014-08-22 DIAGNOSIS — N189 Chronic kidney disease, unspecified: Secondary | ICD-10-CM

## 2014-08-22 LAB — BASIC METABOLIC PANEL
Anion gap: 7 (ref 5–15)
BUN: 45 mg/dL — AB (ref 6–23)
CHLORIDE: 114 meq/L — AB (ref 96–112)
CO2: 20 mmol/L (ref 19–32)
Calcium: 7.9 mg/dL — ABNORMAL LOW (ref 8.4–10.5)
Creatinine, Ser: 2.56 mg/dL — ABNORMAL HIGH (ref 0.50–1.35)
GFR, EST AFRICAN AMERICAN: 26 mL/min — AB (ref >90)
GFR, EST NON AFRICAN AMERICAN: 22 mL/min — AB (ref >90)
GLUCOSE: 101 mg/dL — AB (ref 70–99)
Potassium: 3.8 mmol/L (ref 3.5–5.1)
Sodium: 141 mmol/L (ref 135–145)

## 2014-08-22 LAB — CBC
HEMATOCRIT: 25.2 % — AB (ref 39.0–52.0)
HEMOGLOBIN: 8.3 g/dL — AB (ref 13.0–17.0)
MCH: 31.7 pg (ref 26.0–34.0)
MCHC: 32.9 g/dL (ref 30.0–36.0)
MCV: 96.2 fL (ref 78.0–100.0)
Platelets: 160 10*3/uL (ref 150–400)
RBC: 2.62 MIL/uL — ABNORMAL LOW (ref 4.22–5.81)
RDW: 12.9 % (ref 11.5–15.5)
WBC: 6.5 10*3/uL (ref 4.0–10.5)

## 2014-08-22 MED ORDER — MAGNESIUM SULFATE 2 GM/50ML IV SOLN
2.0000 g | Freq: Once | INTRAVENOUS | Status: AC
  Administered 2014-08-22: 2 g via INTRAVENOUS
  Filled 2014-08-22: qty 50

## 2014-08-22 NOTE — Progress Notes (Signed)
[facility]                                                                              Patient Demographics  Patrick Boyle, is a 80 y.o. male, DOB - 02/03/1934, RSW:[id number]  Admit date - 08/20/2014   Admitting Physician Etta Quill, DO  Outpatient Primary MD for the patient is FRIED, Jaymes Graff, MD  LOS - 2   Chief Complaint  Patient presents with  . Fever  . Weakness      HPI on 08/21/2014 by Dr. Jennette Kettle Patrick Boyle is a 80 y.o. male who was just admitted last week with AKI on CKD caused by obstructive uropathy due to enlarged prostate. Had catheter placement during that admission which improved his creatinine down from 3.4 on admission to 2.8 on discharge. Catheter was removed a couple of days ago. Over past 24 hours has developed generalized weakness, fever. No associated chest pain, nausea, SOB, vomiting, diarrhea, headache, back pain. Does have some associated abdominal pain. Nothing seems to make symptoms better or worse.  W/U in ED confirmed presence of UTI.  Assessment & Plan   Sepsis secondary UTI -Patient was febrile with leukocytosis upon admission -Will continue ceftriaxone -Urine culture: >100K GNR -Patient had foley, which was removed last week, possibly catheter associated  New Atrial Flutter -Resolved, likely secondary to sepsis and emesis -Patient had episode of emesis, followed by Atrial flutter -Currently rate controlled -TSH 1.877 -Magnesium 1.5, will replete -ICD interrogated and functioning properly  Nausea/Vomiting -Likely secondary to sepsis -Will order antiemetics   Urinary retention -Foley catheter reinserted 08/21/2014 -Spoke with Dr. Diona Fanti (on 08/21/2014), who recommended discharging patient with foley and he or a partner will see after discharge -Continue flomax  Chronic Kidney Disease, Stage IV -Likely at baseline, at previous hospital admission was 3.4 and trended down to 2.8 upon discharge -Cr  2.56 -Continue to monitor closely  Diabetes Mellitus, type 2 with neuropathy -Continue amaryl and tradjenta with CBG monitoring -Continue gabapentin for neuropathy  Hyperlipidemia -Continue statin  GERD -Continue PPI  Chronic systolic CHF -Has ICD in place (2012) -Continue to monitor intake output and daily weights -Currently euvolemic and compensated  Normocytic Anemia -Likely dilutional -Will continue to monitor CBC -Will discontinue IVF  Code Status: Full  Family Communication: None at bedside  Disposition Plan: Admitted  Time Spent in minutes   30 minutes  Procedures  None  Consults   Dr. Diona Fanti, urology, via phone  DVT Prophylaxis  heparin  Lab Results  Component Value Date   PLT 160 08/22/2014    Medications  Scheduled Meds: . aspirin  325 mg Oral Daily  . calcitRIOL  0.25 mcg Oral Daily  . cefTRIAXone (ROCEPHIN)  IV  1 g Intravenous Q24H  . cholecalciferol  1,000 Units Oral Daily  . donepezil  10 mg Oral QHS  . feeding supplement (RESOURCE BREEZE)  1 Container Oral BID BM  . fluticasone  1 puff Inhalation Daily  . gabapentin  300 mg Oral BID  . glimepiride  4 mg Oral BID  . heparin  5,000 Units Subcutaneous 3 times per day  . latanoprost  1 drop Both Eyes QHS  .  levothyroxine  50 mcg Oral QAC breakfast  . linagliptin  5 mg Oral Daily  . loratadine  10 mg Oral Daily  . omega-3 acid ethyl esters  1 g Oral BID  . pantoprazole  40 mg Oral Daily  . psyllium  1 packet Oral Daily  . simvastatin  40 mg Oral QHS  . sodium bicarbonate  650 mg Oral TID  . tamsulosin  0.4 mg Oral QPC supper   Continuous Infusions: . sodium chloride 50 mL/hr at 08/21/14 2234   PRN Meds:.acetaminophen **OR** acetaminophen, meclizine, ondansetron (ZOFRAN) IV, ondansetron, traMADol  Antibiotics    Anti-infectives    Start     Dose/Rate Route Frequency Ordered Stop   08/21/14 2300  cefTRIAXone (ROCEPHIN) 1 g in dextrose 5 % 50 mL IVPB     1 g100 mL/hr over 30  Minutes Intravenous Every 24 hours 08/21/14 0014     08/20/14 2315  cefTRIAXone (ROCEPHIN) 1 g in dextrose 5 % 50 mL IVPB     1 g100 mL/hr over 30 Minutes Intravenous  Once 08/20/14 2306 08/20/14 2342      Subjective:   Reymundo Poll seen and examined today.  Patient states he feels better today and wants to know when he can go home.  He denies further nausea or vomiting.  He denies palpitations, chest pain, SOB, abdominal pain.  Objective:   Filed Vitals:   08/21/14 1243 08/21/14 1346 08/21/14 2039 08/22/14 0552  BP: 142/70 133/63 127/61 116/67  Pulse: 72 72 86 62  Temp:  97.9 F (36.6 C) 98 F (36.7 C) 97.5 F (36.4 C)  TempSrc:  Oral Oral Oral  Resp:  20 24 16   Height:      Weight:      SpO2:  97% 99% 97%    Wt Readings from Last 3 Encounters:  08/21/14 90 kg (198 lb 6.6 oz)  08/08/14 88.6 kg (195 lb 5.2 oz)  02/22/14 84.823 kg (187 lb)     Intake/Output Summary (Last 24 hours) at 08/22/14 1054 Last data filed at 08/22/14 0700  Gross per 24 hour  Intake   1130 ml  Output   1251 ml  Net   -121 ml    Exam  General: Well developed, well nourished, NAD, appears stated age  HEENT: NCAT, mucous membranes moist.   Cardiovascular: S1 S2 auscultated, no rubs, murmurs or gallops. Regular rate and rhythm.  Respiratory: Clear to auscultation bilaterally with equal chest rise  Abdomen: Soft, nontender, nondistended, + bowel sounds  Extremities: warm dry without cyanosis clubbing or edema  Neuro: AAOx3, no focal deficits  Skin: Without rashes exudates or nodules  Psych: Appropriate  Data Review   Micro Results Recent Results (from the past 240 hour(s))  Urine culture     Status: None (Preliminary result)   Collection Time: 08/20/14  9:43 PM  Result Value Ref Range Status   Specimen Description URINE, CATHETERIZED  Final   Special Requests NONE  Final   Culture  Setup Time   Final    08/21/2014 04:23 Performed at [place]    Final    >=100,000 COLONIES/ML Performed at [facility]    Culture   Final    [place] Performed at [facility]    Report Status PENDING  Incomplete  Clostridium Difficile by PCR     Status: None   Collection Time: 08/21/14  4:15 AM  Result Value Ref Range Status  C difficile by pcr NEGATIVE NEGATIVE Final    Comment: Performed at [facility]    Radiology Reports Ct Abdomen Pelvis Wo Contrast  08/20/2014   CLINICAL DATA:  Acute onset of generalized weakness for 4 days. Fever. Initial encounter.  EXAM: CT ABDOMEN AND PELVIS WITHOUT CONTRAST  TECHNIQUE: Multidetector CT imaging of the abdomen and pelvis was performed following the standard protocol without IV contrast.  COMPARISON:  Renal ultrasound performed 08/08/2014  FINDINGS: Minimal right basilar atelectasis is noted. Diffuse coronary artery calcification is noted. A pacemaker lead is partially imaged.  The liver and spleen are unremarkable in appearance. The patient is status post cholecystectomy, with clips noted at the gallbladder fossa. The pancreas and adrenal glands are unremarkable.  Nonspecific perinephric stranding is noted bilaterally. The kidneys are otherwise unremarkable. There is no evidence of hydronephrosis. No renal or ureteral stones are seen.  No free fluid is identified. The small bowel is unremarkable in appearance. The stomach is within normal limits. No acute vascular abnormalities are seen. Scattered calcification is seen along the abdominal aorta and its branches. There is borderline ectasia of the distal abdominal aorta.  The appendix unremarkable in appearance, though difficult to fully assess. There is no evidence for appendicitis. The colon is largely filled with fluid. Diffuse diverticulosis is noted along the descending and sigmoid colon, without evidence for diverticulitis.  The bladder is mildly distended. Scattered foci of air are seen within the bladder, of uncertain  significance. There is focal anterior bladder wall thickening, and several small stones are seen dependently within the bladder. Would correlate for recent Foley catheter placement. The prostate is enlarged, measuring 5.6 cm in transverse dimension. No inguinal lymphadenopathy is seen.  No acute osseous abnormalities are identified. The patient's right hip arthroplasty is grossly unremarkable in appearance, though incompletely imaged. Degenerative joint space narrowing and subcortical cystic change is noted at the left hip joint.  IMPRESSION: 1. Scattered foci of air noted within the bladder, of uncertain significance. Would correlate for recent Foley catheter placement. If no Foley catheter has recently been placed, emphysematous cystitis could have such an appearance. 2. Focal anterior bladder wall thickening, and several small stones dependently within the bladder. An underlying mass cannot be excluded. Would consider cystoscopy for further evaluation, when and as deemed clinically appropriate. 3. Enlarged prostate noted. 4. Scattered calcification along the abdominal aorta and its branches. 5. Diffuse diverticulosis along the descending and sigmoid colon, without evidence of diverticulitis. 6. Diffuse coronary artery calcification noted. 7. Degenerative joint space narrowing and subcortical cystic change at the left hip.   Electronically Signed   By: Garald Balding M.D.   On: 08/20/2014 23:48   US Renal  08/08/2014   CLINICAL DATA:  80 year old male with acute renal failure. Initial encounter. Current history of Chronic kidney disease.  EXAM: RENAL/URINARY TRACT ULTRASOUND COMPLETE  COMPARISON:  05/15/2014.  FINDINGS: Right Kidney:  Length: 9.5 cm. Unchanged upper pole simple cyst measuring up to 2.0 cm. Echogenicity Stable and within normal limits. No mass or hydronephrosis visualized.  Left Kidney:  Length: 9.7 cm. Echogenicity Stable and within normal limits. No mass or hydronephrosis visualized.  Bladder:   Appears normal for degree of bladder distention. Bilateral ureteral jets detected.  IMPRESSION: Stable sonographic appearance of the kidneys since September, no obstructive uropathy.   Electronically Signed   By: Lars Pinks M.D.   On: 08/08/2014 13:24   Dg Chest Port 1 View  08/08/2014   CLINICAL DATA:  80 year old male  with fever weakness and diarrhea for 4 days. Initial encounter.  EXAM: PORTABLE CHEST - 1 VIEW  COMPARISON:  07/21/2013 and earlier.  FINDINGS: Portable AP upright view at 1018 hrs. Stable left chest single lead cardiac AICD. Sequelae of median sternotomy appears stable. Stable cardiomegaly and mediastinal contours. Lung volumes not significantly changed with no pneumothorax, pulmonary edema, pleural effusion or confluent pulmonary opacity.  IMPRESSION: No acute cardiopulmonary abnormality.   Electronically Signed   By: Lars Pinks M.D.   On: 08/08/2014 10:29    CBC  Recent Labs Lab 08/20/14 2133 08/21/14 0500 08/22/14 0655  WBC 13.0* 13.1* 6.5  HGB 10.0* 10.4* 8.3*  HCT 30.6* 31.9* 25.2*  PLT 187 187 160  MCV 95.3 96.4 96.2  MCH 31.2 31.4 31.7  MCHC 32.7 32.6 32.9  RDW 12.9 13.1 12.9  LYMPHSABS 1.7  --   --   MONOABS 0.9  --   --   EOSABS 0.2  --   --   BASOSABS 0.0  --   --     Chemistries   Recent Labs Lab 08/20/14 2133 08/21/14 0500 08/21/14 1412 08/22/14 0655  NA 137 136  --  141  K 4.5 4.1  --  3.8  CL 103 108  --  114*  CO2 19 18*  --  20  GLUCOSE 114* 137*  --  101*  BUN 36* 37*  --  45*  CREATININE 2.70* 2.79*  --  2.56*  CALCIUM 9.0 8.4  --  7.9*  MG  --   --  1.5  --   AST 20  --   --   --   ALT 21  --   --   --   ALKPHOS 59  --   --   --   BILITOT 0.3  --   --   --    ------------------------------------------------------------------------------------------------------------------ estimated creatinine clearance is 25.3 mL/min (by C-G formula based on Cr of  2.56). ------------------------------------------------------------------------------------------------------------------ No results for input(s): HGBA1C in the last 72 hours. ------------------------------------------------------------------------------------------------------------------ No results for input(s): CHOL, HDL, LDLCALC, TRIG, CHOLHDL, LDLDIRECT in the last 72 hours. ------------------------------------------------------------------------------------------------------------------  Recent Labs  08/21/14 1412  TSH 1.877   ------------------------------------------------------------------------------------------------------------------ No results for input(s): VITAMINB12, FOLATE, FERRITIN, TIBC, IRON, RETICCTPCT in the last 72 hours.  Coagulation profile No results for input(s): INR, PROTIME in the last 168 hours.  No results for input(s): DDIMER in the last 72 hours.  Cardiac Enzymes No results for input(s): CKMB, TROPONINI, MYOGLOBIN in the last 168 hours.  Invalid input(s): CK ------------------------------------------------------------------------------------------------------------------ Invalid input(s): POCBNP    Pam Vanalstine D.O. on 08/22/2014 at 10:54 AM  Between 7am to 7pm - Pager - [phone]  After 7pm go to [email or web address] - password [email or web address]  And look for the night coverage person covering for me after hours  [facility] Office  [phone]

## 2014-08-23 DIAGNOSIS — K922 Gastrointestinal hemorrhage, unspecified: Secondary | ICD-10-CM

## 2014-08-23 DIAGNOSIS — D62 Acute posthemorrhagic anemia: Secondary | ICD-10-CM

## 2014-08-23 DIAGNOSIS — R339 Retention of urine, unspecified: Secondary | ICD-10-CM

## 2014-08-23 DIAGNOSIS — N400 Enlarged prostate without lower urinary tract symptoms: Secondary | ICD-10-CM

## 2014-08-23 LAB — URINE CULTURE: Colony Count: >=100000

## 2014-08-23 LAB — BASIC METABOLIC PANEL
ANION GAP: 4 — AB (ref 5–15)
BUN: 59 mg/dL — ABNORMAL HIGH (ref 6–23)
CHLORIDE: 118 meq/L — AB (ref 96–112)
CO2: 20 mmol/L (ref 19–32)
Calcium: 7.9 mg/dL — ABNORMAL LOW (ref 8.4–10.5)
Creatinine, Ser: 2.52 mg/dL — ABNORMAL HIGH (ref 0.50–1.35)
GFR calc Af Amer: 27 mL/min — ABNORMAL LOW (ref >90)
GFR calc non Af Amer: 23 mL/min — ABNORMAL LOW (ref >90)
Glucose, Bld: 267 mg/dL — ABNORMAL HIGH (ref 70–99)
Potassium: 5.2 mmol/L — ABNORMAL HIGH (ref 3.5–5.1)
SODIUM: 142 mmol/L (ref 135–145)

## 2014-08-23 LAB — HEMOGLOBIN AND HEMATOCRIT, BLOOD
HCT: 20.5 % — ABNORMAL LOW (ref 39.0–52.0)
Hemoglobin: 7 g/dL — ABNORMAL LOW (ref 13.0–17.0)

## 2014-08-23 LAB — CBC
HCT: 19.2 % — ABNORMAL LOW (ref 39.0–52.0)
HEMOGLOBIN: 6.4 g/dL — AB (ref 13.0–17.0)
MCH: 31.4 pg (ref 26.0–34.0)
MCHC: 33.3 g/dL (ref 30.0–36.0)
MCV: 94.1 fL (ref 78.0–100.0)
PLATELETS: 142 10*3/uL — AB (ref 150–400)
RBC: 2.04 MIL/uL — AB (ref 4.22–5.81)
RDW: 12.7 % (ref 11.5–15.5)
WBC: 5.7 10*3/uL (ref 4.0–10.5)

## 2014-08-23 LAB — HEMOGLOBIN A1C
Hgb A1c MFr Bld: 7.6 % — ABNORMAL HIGH (ref <5.7)
Mean Plasma Glucose: 171 mg/dL — ABNORMAL HIGH (ref <117)

## 2014-08-23 LAB — GLUCOSE, CAPILLARY: Glucose-Capillary: 238 mg/dL — ABNORMAL HIGH (ref 70–99)

## 2014-08-23 LAB — ABO/RH: ABO/RH(D): O POS

## 2014-08-23 LAB — PREPARE RBC (CROSSMATCH)

## 2014-08-23 MED ORDER — INSULIN ASPART 100 UNIT/ML ~~LOC~~ SOLN
0.0000 [IU] | Freq: Three times a day (TID) | SUBCUTANEOUS | Status: DC
  Administered 2014-08-23: 3 [IU] via SUBCUTANEOUS
  Administered 2014-08-24: 5 [IU] via SUBCUTANEOUS
  Administered 2014-08-24: 1 [IU] via SUBCUTANEOUS
  Administered 2014-08-26: 2 [IU] via SUBCUTANEOUS
  Administered 2014-08-26: 1 [IU] via SUBCUTANEOUS
  Administered 2014-08-26 – 2014-08-27 (×2): 2 [IU] via SUBCUTANEOUS

## 2014-08-23 MED ORDER — SODIUM CHLORIDE 0.9 % IV SOLN: Freq: Once | INTRAVENOUS | Status: DC

## 2014-08-23 MED ORDER — PEG 3350-KCL-NA BICARB-NACL 420 G PO SOLR
4000.0000 mL | Freq: Once | ORAL | Status: AC
  Administered 2014-08-24: 4000 mL via ORAL

## 2014-08-23 MED ORDER — SODIUM CHLORIDE 0.9 % IV SOLN
INTRAVENOUS | Status: DC
  Administered 2014-08-24: 08:00:00 via INTRAVENOUS
  Administered 2014-08-25: 500 mL via INTRAVENOUS

## 2014-08-23 NOTE — Progress Notes (Signed)
PT Cancellation Note  Patient Details Name: Patrick Boyle MRN: [id number] DOB: 01/15/1934   Cancelled Treatment:     HgB 6.4        Will reattempt as schedule permits.   Rica Koyanagi  PTA [facility]  Acute  Rehab Pager      [phone]

## 2014-08-23 NOTE — Progress Notes (Addendum)
[facility] PROGRESS NOTE  Patrick Boyle PNT:[id number] DOB: 15-Oct-1933 DOA: 08/20/2014 PCP: Abigail Miyamoto, MD  Assessment/Plan: Sepsis secondary to klebsiella UTI -Patient was febrile, with leukocytosis and tachycardia upon admission -Will continue ceftriaxone -Urine culture: klebsiella positive to rocephin -will continue abx's, supportive care and follow urine output  New Atrial Flutter -Resolved, likely secondary to sepsis and emesis -Patient had episode of emesis, followed by Atrial flutter -Currently rate controlled -TSH 1.877 -Magnesium repleted -ICD interrogated and functioning properly  Nausea/Vomiting -will continue PRN antiemetics   Urinary retention -Foley catheter reinserted 08/21/2014 -Spoke with Dr. Diona Fanti (on 08/21/2014), who recommended discharging patient with foley and he or a partner will see after discharge. -Continue flomax  Chronic Kidney Disease, Stage IV -Likely at baseline, at previous hospital admission was 3.4 and trended down to 2.8 upon discharge -Cr 2.56 -Continue to monitor closely  Diabetes Mellitus, type 2 with neuropathy -Continue gabapentin for neuropathy -will use SSI  Hyperlipidemia -Continue statin  GERD -Continue PPI  Chronic systolic CHF -Has ICD in place (2012) -Continue to monitor intake output and daily weights -Currently euvolemic and compensated -Denies SOB  Normocytic Anemia: in the 10's range at baseline: due to CKD and with acute blood loss anemia component from lower GI bleed -due to lower GI bleed -Will continue to monitoring CBC -s/p transfusion of 1 unit of PRBC -GI consulted -heparin discontinue -will hold ASA -diet change to full liquid   Code Status: Full  Code Status: Full Family Communication: wife and daughter at bedside Disposition Plan: to be determine    Consultants:  GI (Dr. Michail Sermon)  Procedures:  See below for x-ray report   Antibiotics:  Rocephin  12/23  HPI/Subjective: Afebrile; feeling better. Patient with positive bloody stools (X3 episodes; maroon/blackish in appearance. Hgb dropped down to 6.4 and patient received 1 unit of PRBC   Objective: Filed Vitals:   08/23/14 1315  BP: 114/62  Pulse: 85  Temp: 97.9 F (36.6 C)  Resp: 20    Intake/Output Summary (Last 24 hours) at 08/23/14 1532 Last data filed at 08/23/14 1417  Gross per 24 hour  Intake   1160 ml  Output    775 ml  Net    385 ml   Filed Weights   08/20/14 2025 08/21/14 0132 08/23/14 0547  Weight: 86.183 kg (190 lb) 90 kg (198 lb 6.6 oz) 86.229 kg (190 lb 1.6 oz)    Exam:   General:  Feeling ok; reports some bloody stools overnight and also 1 episode of vomiting  Cardiovascular: S1 and S2, no rubs or gallops; positive SEM  Respiratory: CTA bilaterally  Abdomen: soft, positive BS, no guarding, no distension   Musculoskeletal: no edema or cyanosis  Data Reviewed: Basic Metabolic Panel:  Recent Labs Lab 08/20/14 2133 08/21/14 0500 08/21/14 1412 08/22/14 0655 08/23/14 0425  NA 137 136  --  141 142  K 4.5 4.1  --  3.8 5.2*  CL 103 108  --  114* 118*  CO2 19 18*  --  20 20  GLUCOSE 114* 137*  --  101* 267*  BUN 36* 37*  --  45* 59*  CREATININE 2.70* 2.79*  --  2.56* 2.52*  CALCIUM 9.0 8.4  --  7.9* 7.9*  MG  --   --  1.5  --   --    Liver Function Tests:  Recent Labs Lab 08/20/14 2133  AST 20  ALT 21  ALKPHOS 59  BILITOT 0.3  PROT 6.7  ALBUMIN 3.1*  CBC:  Recent Labs Lab 08/20/14 2133 08/21/14 0500 08/22/14 0655 08/23/14 0425 08/23/14 1500  WBC 13.0* 13.1* 6.5 5.7  --   NEUTROABS 10.2*  --   --   --   --   HGB 10.0* 10.4* 8.3* 6.4* 7.0*  HCT 30.6* 31.9* 25.2* 19.2* 20.5*  MCV 95.3 96.4 96.2 94.1  --   PLT 187 187 160 142*  --    CBG: No results for input(s): GLUCAP in the last 168 hours.  Recent Results (from the past 240 hour(s))  Urine culture     Status: None   Collection Time: 08/20/14  9:43 PM  Result Value  Ref Range Status   Specimen Description URINE, CATHETERIZED  Final   Special Requests NONE  Final   Culture  Setup Time   Final    08/21/2014 04:23 Performed at [place]   Final    >=100,000 COLONIES/ML Performed at [place] Performed at [facility]    Report Status 08/23/2014 FINAL  Final   Organism ID, Bacteria KLEBSIELLA PNEUMONIAE  Final      Susceptibility   Klebsiella pneumoniae - MIC*    AMPICILLIN >=32 RESISTANT Resistant     CEFAZOLIN <=4 SENSITIVE Sensitive     CEFTRIAXONE <=1 SENSITIVE Sensitive     CIPROFLOXACIN <=0.25 SENSITIVE Sensitive     GENTAMICIN <=1 SENSITIVE Sensitive     LEVOFLOXACIN <=0.12 SENSITIVE Sensitive     NITROFURANTOIN 64 INTERMEDIATE Intermediate     TOBRAMYCIN <=1 SENSITIVE Sensitive     TRIMETH/SULFA <=20 SENSITIVE Sensitive     PIP/TAZO <=4 SENSITIVE Sensitive     * KLEBSIELLA PNEUMONIAE  Clostridium Difficile by PCR     Status: None   Collection Time: 08/21/14  4:15 AM  Result Value Ref Range Status   C difficile by pcr NEGATIVE NEGATIVE Final    Comment: Performed at [facility]     Studies: No results found.  Scheduled Meds: . sodium chloride   Intravenous Once  . calcitRIOL  0.25 mcg Oral Daily  . cefTRIAXone (ROCEPHIN)  IV  1 g Intravenous Q24H  . cholecalciferol  1,000 Units Oral Daily  . donepezil  10 mg Oral QHS  . feeding supplement (RESOURCE BREEZE)  1 Container Oral BID BM  . fluticasone  1 puff Inhalation Daily  . gabapentin  300 mg Oral BID  . insulin aspart  0-9 Units Subcutaneous TID WC  . latanoprost  1 drop Both Eyes QHS  . levothyroxine  50 mcg Oral QAC breakfast  . linagliptin  5 mg Oral Daily  . loratadine  10 mg Oral Daily  . omega-3 acid ethyl esters  1 g Oral BID  . pantoprazole  40 mg Oral Daily  . psyllium  1 packet Oral Daily  . simvastatin  40 mg Oral QHS  . sodium bicarbonate  650 mg Oral TID  .  tamsulosin  0.4 mg Oral QPC supper   Continuous Infusions:   Principal Problem:   Sepsis secondary to UTI Active Problems:   Chronic systolic congestive heart failure   Acute on chronic renal failure   Acute cystitis without hematuria    Time spent: 30 minutes    [place] Dubois MD [facility] Pager [phone]. If 7PM-7AM, please contact night-coverage at [email or web address], password [facility] 08/23/2014, 3:32 PM  LOS: 3 days

## 2014-08-23 NOTE — Consult Note (Signed)
Referring Provider: Dr. Dyann Kief Primary Care Physician:  Abigail Miyamoto, MD Primary Gastroenterologist:  Althia Forts  Reason for Consultation:  GI bleed  HPI: Patrick Boyle is a 80 y.o. male seen for a consult due to the acute onset of rectal bleeding last night described as 3 large maroon colored stools overnight without associated hematemesis, abdominal pain or melena. He had food-colored vomitus yesterday as well. Reports dizziness yesterday. No further bleeding or BMs since last night. He reports having bloody loose stools last week that he attributes to the antibiotics that he was on. Had acute on chronic renal failure. Reports a normal colonoscopy in [place] in 2008. C. Diff negative on 08/21/14. Hgb 6.4 this morning (8.3).  Past Medical History  Diagnosis Date  . Diabetes mellitus     dr Olen Pel  . Hyperlipidemia   . Hypertension   . Coronary artery disease     s/p CABG 1997  . GERD (gastroesophageal reflux disease)   . Arthritis   . Allergic rhinitis   . Hyperplastic colon polyp   . Diverticulitis, colon   . Meniere's syndrome   . Tremor, essential     dr Jannifer Franklin  . COPD, mild     dr clance  . MI (myocardial infarction) 1997, 2005  . Ischemic cardiomyopathy     sp ICD (SJM) implanted by Dr Rayann Heman    Past Surgical History  Procedure Laterality Date  . Coronary artery bypass graft  [place]  . Cardiac catheterization  2005    medical therapy  . Cardiac defibrillator placement      ICD implanted by Dr Rayann Heman, Analyze ST study patient    Prior to Admission medications   Medication Sig Start Date End Date Taking? Authorizing Provider  Acetaminophen (ARTHRITIS PAIN RELIEF PO) Take 650 mg by mouth 2 (two) times daily.    Yes Historical Provider, MD  aspirin 325 MG tablet Take 325 mg by mouth daily.     Yes Historical Provider, MD  bimatoprost (LUMIGAN) 0.03 % ophthalmic solution Place 1 drop into both eyes at bedtime.    Yes Historical Provider, MD  calcitRIOL (ROCALTROL)  0.25 MCG capsule Take 0.25 mcg by mouth daily.   Yes Historical Provider, MD  cetirizine (ZYRTEC) 10 MG tablet Take 5 mg by mouth daily.     Yes Historical Provider, MD  cholecalciferol (VITAMIN D) 1000 UNITS tablet Take 1,000 Units by mouth daily.   Yes Historical Provider, MD  donepezil (ARICEPT) 10 MG tablet Take 10 mg by mouth at bedtime.   Yes Historical Provider, MD  fish oil-omega-3 fatty acids 1000 MG capsule Take 2 g by mouth 2 (two) times daily.    Yes Historical Provider, MD  Fluticasone Propionate, Inhal, (FLOVENT DISKUS) 100 MCG/BLIST AEPB Inhale 1 puff into the lungs daily.    Yes Historical Provider, MD  gabapentin (NEURONTIN) 300 MG capsule Take 1 capsule (300 mg total) by mouth 2 (two) times daily. 08/12/14  Yes Orson Eva, MD  glimepiride (AMARYL) 4 MG tablet Take 4 mg by mouth 2 (two) times daily.     Yes Historical Provider, MD  lansoprazole (PREVACID) 30 MG capsule Take 30 mg by mouth daily as needed (for acid reflex).    Yes Historical Provider, MD  levothyroxine (SYNTHROID, LEVOTHROID) 50 MCG tablet Take 50 mcg by mouth daily before breakfast.   Yes Historical Provider, MD  meclizine (ANTIVERT) 25 MG tablet Take 25 mg by mouth daily as needed (for vertigo).  Yes Historical Provider, MD  psyllium (REGULOID) 0.52 G capsule Take 0.52 g by mouth daily.   Yes Historical Provider, MD  simvastatin (ZOCOR) 40 MG tablet Take 40 mg by mouth at bedtime.     Yes Historical Provider, MD  sitaGLIPtin (JANUVIA) 50 MG tablet Take 50 mg by mouth daily with breakfast.   Yes Historical Provider, MD  sodium bicarbonate 650 MG tablet Take 1 tablet (650 mg total) by mouth 2 (two) times daily. Patient taking differently: Take 650 mg by mouth 3 (three) times daily.  08/12/14  Yes Orson Eva, MD  tamsulosin (FLOMAX) 0.4 MG CAPS capsule Take 1 capsule (0.4 mg total) by mouth daily after supper. 08/12/14  Yes Orson Eva, MD  traMADol (ULTRAM) 50 MG tablet Take 1 tablet by mouth every 6 (six) hours as  needed. For 10 days 08/12/14  Yes Historical Provider, MD  furosemide (LASIX) 20 MG tablet Take 20 mg by mouth every other day.     Historical Provider, MD    Scheduled Meds: . sodium chloride   Intravenous Once  . calcitRIOL  0.25 mcg Oral Daily  . cefTRIAXone (ROCEPHIN)  IV  1 g Intravenous Q24H  . cholecalciferol  1,000 Units Oral Daily  . donepezil  10 mg Oral QHS  . feeding supplement (RESOURCE BREEZE)  1 Container Oral BID BM  . fluticasone  1 puff Inhalation Daily  . gabapentin  300 mg Oral BID  . insulin aspart  0-9 Units Subcutaneous TID WC  . latanoprost  1 drop Both Eyes QHS  . levothyroxine  50 mcg Oral QAC breakfast  . linagliptin  5 mg Oral Daily  . loratadine  10 mg Oral Daily  . omega-3 acid ethyl esters  1 g Oral BID  . pantoprazole  40 mg Oral Daily  . psyllium  1 packet Oral Daily  . simvastatin  40 mg Oral QHS  . sodium bicarbonate  650 mg Oral TID  . tamsulosin  0.4 mg Oral QPC supper   Continuous Infusions:  PRN Meds:.acetaminophen **OR** acetaminophen, meclizine, ondansetron (ZOFRAN) IV, ondansetron, traMADol  Allergies as of 08/20/2014 - Review Complete 08/20/2014  Allergen Reaction Noted  . Metformin and related Other (See Comments)   . Ramipril Other (See Comments)     Family History  Problem Relation Age of Onset  . Parkinsonism Father   . Stomach cancer Mother   . Diabetes Mother     History   Social History  . Marital Status: Married    Spouse Name: N/A    Number of Children: N/A  . Years of Education: N/A   Occupational History  . retired    Social History Main Topics  . Smoking status: Former Smoker -- 35 years    Types: Cigarettes    Quit date: 09/01/1995  . Smokeless tobacco: Not on file  . Alcohol Use: 0.0 oz/week     Comment: occasional scotch  . Drug Use: No  . Sexual Activity: Not on file   Other Topics Concern  . Not on file   Social History Narrative   Pt lives in [place] with wife.   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  Physical Exam: Vital signs: Filed Vitals:   08/23/14 1315  BP: 114/62  Pulse: 85  Temp: 97.9 F (36.6 C)  Resp: 20   Last BM Date: 08/23/14 General:   Elderly,  lethargic, Well-developed, well-nourished, pleasant and cooperative in NAD HEENT: anicteric Neck: supple, nontender Lungs:  Clear throughout to auscultation.   No wheezes, crackles, or rhonchi. No acute distress. Heart:  Regular rate and rhythm; no murmurs, clicks, rubs,  or gallops. Abdomen: soft, nontender, nondistended, +BS  Rectal:  Deferred Ext: no edema  GI:  Lab Results:  Recent Labs  08/21/14 0500 08/22/14 0655 08/23/14 0425 08/23/14 1500  WBC 13.1* 6.5 5.7  --   HGB 10.4* 8.3* 6.4* 7.0*  HCT 31.9* 25.2* 19.2* 20.5*  PLT 187 160 142*  --    BMET  Recent Labs  08/21/14 0500 08/22/14 0655 08/23/14 0425  NA 136 141 142  K 4.1 3.8 5.2*  CL 108 114* 118*  CO2 18* 20 20  GLUCOSE 137* 101* 267*  BUN 37* 45* 59*  CREATININE 2.79* 2.56* 2.52*  CALCIUM 8.4 7.9* 7.9*   LFT  Recent Labs  08/20/14 2133  PROT 6.7  ALBUMIN 3.1*  AST 20  ALT 21  ALKPHOS 59  BILITOT 0.3   PT/INR No results for input(s): LABPROT, INR in the last 72 hours.   Studies/Results: No results found.  Impression/Plan:  80 yo with maroon stools X 3 last night and drop in Hgb to 6.4. Hemodynamically stable. No further bleeding. Suspect a diverticular bleed but ischemic colitis also high on the differential. Malignancy possible as well. Doubt peptic ulcer bleed. Colon prep tomorrow and EGD/colonoscopy on 08/25/14. Clear liquids tomorrow. NPO p MN on 08/25/14. Patient agreeable to proceed.   LOS: 3 days   [place] C.  08/23/2014, 4:48 PM

## 2014-08-23 NOTE — Progress Notes (Signed)
Passed a moderate amount of tarry to maroon color soft stool. Patient stated that he had a stool each night after the antibiotic which was the color black. Became nausea and vomited at this time. Observed pieces of food in the emesis.  Zofran IV given.

## 2014-08-23 NOTE — Progress Notes (Signed)
CRITICAL VALUE ALERT  Critical value received:  Hgb 6.4  Date of notification:  08/24/2014  Time of notification:  0510  Critical value read back:Yes.    Nurse who received alert:  L. Vergel de Larkin Ina RN  MD notified (1st page):  Lamar Blinks NP  Time of first page:  [phone]  MD notified (2nd page):  Time of second page:  Responding MD:  Lamar Blinks NP  Time MD responded:  [phone]

## 2014-08-24 DIAGNOSIS — N183 Chronic kidney disease, stage 3 (moderate): Secondary | ICD-10-CM

## 2014-08-24 LAB — GLUCOSE, CAPILLARY
GLUCOSE-CAPILLARY: 107 mg/dL — AB (ref 70–99)
GLUCOSE-CAPILLARY: 136 mg/dL — AB (ref 70–99)
Glucose-Capillary: 102 mg/dL — ABNORMAL HIGH (ref 70–99)
Glucose-Capillary: 259 mg/dL — ABNORMAL HIGH (ref 70–99)
Glucose-Capillary: 111 mg/dL — ABNORMAL HIGH (ref 70–99)

## 2014-08-24 LAB — CBC
HEMATOCRIT: 18.5 % — AB (ref 39.0–52.0)
HEMOGLOBIN: 6.5 g/dL — AB (ref 13.0–17.0)
MCH: 33.7 pg (ref 26.0–34.0)
MCHC: 35.1 g/dL (ref 30.0–36.0)
MCV: 95.9 fL (ref 78.0–100.0)
Platelets: 120 10*3/uL — ABNORMAL LOW (ref 150–400)
RBC: 1.93 MIL/uL — ABNORMAL LOW (ref 4.22–5.81)
RDW: 13.6 % (ref 11.5–15.5)
WBC: 6.6 10*3/uL (ref 4.0–10.5)

## 2014-08-24 LAB — PREPARE RBC (CROSSMATCH)

## 2014-08-24 MED ORDER — SODIUM CHLORIDE 0.9 % IV SOLN: Freq: Once | INTRAVENOUS | Status: DC

## 2014-08-24 MED ORDER — FUROSEMIDE 10 MG/ML IJ SOLN
40.0000 mg | Freq: Once | INTRAMUSCULAR | Status: AC
  Administered 2014-08-24: 40 mg via INTRAVENOUS
  Filled 2014-08-24: qty 4

## 2014-08-24 NOTE — Progress Notes (Signed)
CRITICAL VALUE ALERT  Critical value received:  Hgb 6.5  Date of notification:  08/24/14  Time of notification:  5:22 am  Critical value read back:Yes.    Nurse who received alert:  F. Milagros Loll, RN  MD notified (1st page):  Fredirick Maudlin, PA  Time of first page:  6:03 am  MD notified (2nd page):  Time of second page:  Responding MD:  Fredirick Maudlin, PA  Time MD responded:  6:08 am

## 2014-08-24 NOTE — Progress Notes (Signed)
[facility] PROGRESS NOTE  Patrick Boyle HAL:[id number] DOB: February 02, 1934 DOA: 08/20/2014 PCP: Abigail Miyamoto, MD  Assessment/Plan: Sepsis secondary to klebsiella UTI -Patient was febrile, with leukocytosis and tachycardia upon admission -Will continue ceftriaxone -Urine culture: klebsiella positive to rocephin -will continue abx's, supportive care and follow urine output  New Atrial Flutter -Resolved, likely secondary to sepsis and emesis -Patient had episode of emesis, followed by Atrial flutter -Currently rate controlled -TSH 1.877 -Magnesium repleted -ICD interrogated and functioning properly  Nausea/Vomiting -will continue PRN antiemetics   Urinary retention -Foley catheter reinserted 08/21/2014 -Spoke with Dr. Diona Fanti (on 08/21/2014), who recommended discharging patient with foley and he or a partner will see after discharge. -Continue flomax  Chronic Kidney Disease, Stage IV -Likely at baseline, at previous hospital admission was 3.4 and trended down to 2.8 upon discharge -Cr 2.56 -Continue to monitor closely; BMET in am  Diabetes Mellitus, type 2 with neuropathy -Continue gabapentin for neuropathy -will use SSI  Hyperlipidemia -Continue statin  GERD -Continue PPI  Chronic systolic CHF -Has ICD in place (2012) -Continue to monitor intake output and daily weights -Currently euvolemic and compensated -Denies SOB  Normocytic Anemia: in the 10's range at baseline: due to CKD and with acute blood loss anemia component from lower GI bleed -due to lower GI bleed -Will continue to monitor CBC -s/p transfusion of 1 unit of PRBC on 12/24 -giving Hgb 6.5 on 12/25 (will transfuse 2 more untis of PRBC's) -GI on board and planning colonoscopy in am (12/26) -heparin discontinued; DVT prophylaxis with SCD's -will continue holding ASA -diet change to clear liquid   Code Status: Full  Code Status: Full Family Communication: wife and daughter at  bedside Disposition Plan: to be determine    Consultants:  GI (Dr. Michail Sermon)  Procedures:  See below for x-ray report   Plan is for colonoscopy on 12/26  Antibiotics:  Rocephin 12/23  HPI/Subjective: Feeling ok and more oriented/conversative today (12/25); reports no further nausea or vomiting. toelrating CLD. Hgb 6.5  Objective: Filed Vitals:   08/24/14 1502  BP: 123/50  Pulse: 64  Temp: 97.3 F (36.3 C)  Resp: 20    Intake/Output Summary (Last 24 hours) at 08/24/14 1551 Last data filed at 08/24/14 1412  Gross per 24 hour  Intake    500 ml  Output   1075 ml  Net   -575 ml   Filed Weights   08/21/14 0132 08/23/14 0547 08/24/14 0529  Weight: 90 kg (198 lb 6.6 oz) 86.229 kg (190 lb 1.6 oz) 87.5 kg (192 lb 14.4 oz)    Exam:   General:  Feeling ok and more oriented/conversative today (12/25); reports no further nausea or vomiting. toelrating CLD. Hgb 6.5  Cardiovascular: S1 and S2, no rubs or gallops; positive SEM  Respiratory: CTA bilaterally  Abdomen: soft, positive BS, no guarding, no distension   Musculoskeletal: no edema or cyanosis  Data Reviewed: Basic Metabolic Panel:  Recent Labs Lab 08/20/14 2133 08/21/14 0500 08/21/14 1412 08/22/14 0655 08/23/14 0425  NA 137 136  --  141 142  K 4.5 4.1  --  3.8 5.2*  CL 103 108  --  114* 118*  CO2 19 18*  --  20 20  GLUCOSE 114* 137*  --  101* 267*  BUN 36* 37*  --  45* 59*  CREATININE 2.70* 2.79*  --  2.56* 2.52*  CALCIUM 9.0 8.4  --  7.9* 7.9*  MG  --   --  1.5  --   --  Liver Function Tests:  Recent Labs Lab 08/20/14 2133  AST 20  ALT 21  ALKPHOS 59  BILITOT 0.3  PROT 6.7  ALBUMIN 3.1*   CBC:  Recent Labs Lab 08/20/14 2133 08/21/14 0500 08/22/14 0655 08/23/14 0425 08/23/14 1500 08/24/14 0432  WBC 13.0* 13.1* 6.5 5.7  --  6.6  NEUTROABS 10.2*  --   --   --   --   --   HGB 10.0* 10.4* 8.3* 6.4* 7.0* 6.5*  HCT 30.6* 31.9* 25.2* 19.2* 20.5* 18.5*  MCV 95.3 96.4 96.2 94.1  --   95.9  PLT 187 187 160 142*  --  120*   CBG:  Recent Labs Lab 08/23/14 1658 08/23/14 2139 08/24/14 0750 08/24/14 1159  GLUCAP 238* 107* 102* 259*    Recent Results (from the past 240 hour(s))  Urine culture     Status: None   Collection Time: 08/20/14  9:43 PM  Result Value Ref Range Status   Specimen Description URINE, CATHETERIZED  Final   Special Requests NONE  Final   Culture  Setup Time   Final    08/21/2014 04:23 Performed at [facility]   Final    >=100,000 COLONIES/ML Performed at [facility] Performed at [facility]    Report Status 08/23/2014 FINAL  Final   Organism ID, Bacteria KLEBSIELLA PNEUMONIAE  Final      Susceptibility   Klebsiella pneumoniae - MIC*    AMPICILLIN >=32 RESISTANT Resistant     CEFAZOLIN <=4 SENSITIVE Sensitive     CEFTRIAXONE <=1 SENSITIVE Sensitive     CIPROFLOXACIN <=0.25 SENSITIVE Sensitive     GENTAMICIN <=1 SENSITIVE Sensitive     LEVOFLOXACIN <=0.12 SENSITIVE Sensitive     NITROFURANTOIN 64 INTERMEDIATE Intermediate     TOBRAMYCIN <=1 SENSITIVE Sensitive     TRIMETH/SULFA <=20 SENSITIVE Sensitive     PIP/TAZO <=4 SENSITIVE Sensitive     * KLEBSIELLA PNEUMONIAE  Clostridium Difficile by PCR     Status: None   Collection Time: 08/21/14  4:15 AM  Result Value Ref Range Status   C difficile by pcr NEGATIVE NEGATIVE Final    Comment: Performed at [facility]     Studies: No results found.  Scheduled Meds: . sodium chloride   Intravenous Once  . sodium chloride   Intravenous Once  . calcitRIOL  0.25 mcg Oral Daily  . cefTRIAXone (ROCEPHIN)  IV  1 g Intravenous Q24H  . cholecalciferol  1,000 Units Oral Daily  . donepezil  10 mg Oral QHS  . feeding supplement (RESOURCE BREEZE)  1 Container Oral BID BM  . fluticasone  1 puff Inhalation Daily  . furosemide  40 mg Intravenous Once  . gabapentin  300 mg Oral BID  . insulin aspart   0-9 Units Subcutaneous TID WC  . latanoprost  1 drop Both Eyes QHS  . levothyroxine  50 mcg Oral QAC breakfast  . linagliptin  5 mg Oral Daily  . loratadine  10 mg Oral Daily  . omega-3 acid ethyl esters  1 g Oral BID  . pantoprazole  40 mg Oral Daily  . polyethylene glycol-electrolytes  4,000 mL Oral Once  . psyllium  1 packet Oral Daily  . simvastatin  40 mg Oral QHS  . sodium bicarbonate  650 mg Oral TID  . tamsulosin  0.4 mg Oral QPC supper   Continuous  Infusions: . sodium chloride 20 mL/hr at 08/24/14 7544    Principal Problem:   Sepsis secondary to UTI Active Problems:   Chronic systolic congestive heart failure   Acute on chronic renal failure   Acute cystitis without hematuria    Time spent: 30 minutes    Barton Dubois MD [facility] Pager [phone]. If 7PM-7AM, please contact night-coverage at [email or web address], password [facility] 08/24/2014, 3:51 PM  LOS: 4 days

## 2014-08-25 ENCOUNTER — Encounter (HOSPITAL_COMMUNITY): Payer: Self-pay

## 2014-08-25 ENCOUNTER — Encounter (HOSPITAL_COMMUNITY)
Admission: EM | Disposition: A | Discharge status: Home Health | Payer: Medicare Other | Source: Home / Self Care | Attending: Internal Medicine

## 2014-08-25 DIAGNOSIS — N184 Chronic kidney disease, stage 4 (severe): Secondary | ICD-10-CM

## 2014-08-25 DIAGNOSIS — D649 Anemia, unspecified: Secondary | ICD-10-CM | POA: Diagnosis present

## 2014-08-25 DIAGNOSIS — K922 Gastrointestinal hemorrhage, unspecified: Secondary | ICD-10-CM | POA: Diagnosis present

## 2014-08-25 DIAGNOSIS — N189 Chronic kidney disease, unspecified: Secondary | ICD-10-CM | POA: Insufficient documentation

## 2014-08-25 HISTORY — PX: COLONOSCOPY: SHX5424

## 2014-08-25 HISTORY — PX: ESOPHAGOGASTRODUODENOSCOPY: SHX5428

## 2014-08-25 LAB — CBC
HCT: 24.7 % — ABNORMAL LOW (ref 39.0–52.0)
HEMOGLOBIN: 8.4 g/dL — AB (ref 13.0–17.0)
MCH: 31.5 pg (ref 26.0–34.0)
MCHC: 34 g/dL (ref 30.0–36.0)
MCV: 92.5 fL (ref 78.0–100.0)
Platelets: 114 10*3/uL — ABNORMAL LOW (ref 150–400)
RBC: 2.67 MIL/uL — AB (ref 4.22–5.81)
RDW: 14.1 % (ref 11.5–15.5)
WBC: 6.3 10*3/uL (ref 4.0–10.5)

## 2014-08-25 LAB — BASIC METABOLIC PANEL
Anion gap: 7 (ref 5–15)
BUN: 47 mg/dL — ABNORMAL HIGH (ref 6–23)
CO2: 21 mmol/L (ref 19–32)
Calcium: 7.9 mg/dL — ABNORMAL LOW (ref 8.4–10.5)
Chloride: 108 mEq/L (ref 96–112)
Creatinine, Ser: 2.64 mg/dL — ABNORMAL HIGH (ref 0.50–1.35)
GFR calc Af Amer: 25 mL/min — ABNORMAL LOW (ref >90)
GFR, EST NON AFRICAN AMERICAN: 22 mL/min — AB (ref >90)
GLUCOSE: 152 mg/dL — AB (ref 70–99)
Potassium: 3.9 mmol/L (ref 3.5–5.1)
SODIUM: 136 mmol/L (ref 135–145)

## 2014-08-25 LAB — GLUCOSE, CAPILLARY
GLUCOSE-CAPILLARY: 145 mg/dL — AB (ref 70–99)
GLUCOSE-CAPILLARY: 111 mg/dL — AB (ref 70–99)
Glucose-Capillary: 142 mg/dL — ABNORMAL HIGH (ref 70–99)
Glucose-Capillary: 215 mg/dL — ABNORMAL HIGH (ref 70–99)

## 2014-08-25 SURGERY — EGD (ESOPHAGOGASTRODUODENOSCOPY)
Anesthesia: Moderate Sedation

## 2014-08-25 MED ORDER — FENTANYL CITRATE 0.05 MG/ML IJ SOLN
INTRAMUSCULAR | Status: AC
  Filled 2014-08-25: qty 2

## 2014-08-25 MED ORDER — MIDAZOLAM HCL 5 MG/5ML IJ SOLN
INTRAMUSCULAR | Status: DC | PRN
  Administered 2014-08-25 (×2): 1 mg via INTRAVENOUS
  Administered 2014-08-25: 2 mg via INTRAVENOUS

## 2014-08-25 MED ORDER — MIDAZOLAM HCL 10 MG/2ML IJ SOLN
INTRAMUSCULAR | Status: AC
Start: 2014-08-25 — End: 2014-08-25
  Filled 2014-08-25: qty 2

## 2014-08-25 MED ORDER — FENTANYL CITRATE 0.05 MG/ML IJ SOLN
INTRAMUSCULAR | Status: DC | PRN
  Administered 2014-08-25 (×2): 25 ug via INTRAVENOUS

## 2014-08-25 NOTE — Progress Notes (Signed)
PT Cancellation Note  Patient Details Name: Patrick Boyle MRN: [id number] DOB: [birth year]   Cancelled Treatment:     PT attempted this pm.  Pt out of room at endoscopy.  Will follow.   Kavari Parrillo 08/25/2014, 4:25 PM

## 2014-08-25 NOTE — Interval H&P Note (Signed)
History and Physical Interval Note:  08/25/2014 4:26 PM  Patrick Boyle  has presented today for surgery, with the diagnosis of GI bleed  The various methods of treatment have been discussed with the patient and family. After consideration of risks, benefits and other options for treatment, the patient has consented to  Procedure(s): ESOPHAGOGASTRODUODENOSCOPY (EGD) (N/A) COLONOSCOPY (N/A) as a surgical intervention .  The patient's history has been reviewed, patient examined, no change in status, stable for surgery.  I have reviewed the patient's chart and labs.  Questions were answered to the patient's satisfaction.     [place] C.

## 2014-08-25 NOTE — Op Note (Signed)
[facility] [place], [address]   COLONOSCOPY PROCEDURE REPORT     EXAM DATE: 09/07/2014  PATIENT NAME:      Patrick Boyle, Patrick Boyle           MR #:      [id number] BIRTHDATE:       1934/04/22      VISIT #:     [id number]  ATTENDING:     Wilford Corner, MD     STATUS:     inpatient REFERRING MD: ASA CLASS:        Class III  INDICATIONS:  The patient is a 80 yr old male here for a colonoscopy due to GI bleeding; Anemia. PROCEDURE PERFORMED:     Colonoscopy with snare polypectomy MEDICATIONS:     Fentanyl 25 mcg IV and Versed 3 mg IV ESTIMATED BLOOD LOSS:     None  CONSENT: The patient understands the risks and benefits of the procedure and understands that these risks include, but are not limited to: sedation, allergic reaction, infection, perforation and/or bleeding. Alternative means of evaluation and treatment include, among others: physical exam, x-rays, and/or surgical intervention. The patient elects to proceed with this endoscopic procedure.  DESCRIPTION OF PROCEDURE: During intra-op preparation period all mechanical & medical equipment was checked for proper function. Hand hygiene and appropriate measures for infection prevention was taken. After the risks, benefits and alternatives of the procedure were thoroughly explained, Informed consent was verified, confirmed and timeout was successfully executed by the treatment team. A digital exam revealed no abnormalities of the rectum.      The Pentax Ped Colon [id number] endoscope was introduced through the anus and advanced to the cecum, which was identified by both the appendix and ileocecal valve. The prep was fair.. The instrument was then slowly withdrawn as the colon was fully examined. A 5 mm sessile polyp was removed with snare cautery from the cecum. A 6 mm sessile polyp was removed with a cold snare from the proximal ascending colon (polyp was removed prior to cautery due to looping  and spasm of the colon). Minimal bleeding at the site occurred that spontaneously stopped. A 4 mm sessile polyp was removed from the descending colon with snare cautery and a 1.4 cm sessile polyp was also removed from the descending colon with snare cautery. Diffuse sigmoid diverticulae were noted. No blood or blood products seen on insertion.       Retroflexed views revealed small internal hemorrhoids.  The scope was then completely withdrawn from the patient and the procedure terminated.     ADVERSE EVENTS:      There were no immediate complications.   IMPRESSIONS:     Colon polyps X 4 - removed as per above Sigmoid diverticulosis Internal hemorrhoids Suspect bleeding was diverticular in origin and has resolved   RECOMMENDATIONS:     F/U on path; Start soft solids and advance as tolerated; No aspirin products for 5 days   Wilford Corner, MD eSigned:  Wilford Corner, MD 09/07/2014 6:06 PM   cc:  CPT CODES: ICD CODES:  The ICD and CPT codes recommended by this software are interpretations from the data that the clinical staff has captured with the software.  The verification of the translation of this report to the ICD and CPT codes and modifiers is the sole responsibility of the health care institution and practicing physician where this report was generated.  [employer]. will not be held responsible for the validity  of the ICD and CPT codes included on this report.  AMA assumes no liability for data contained or not contained herein. CPT is a Designer, television/film set of the [employer].  PATIENT NAME:  Patrick Boyle, Patrick Boyle MR#: [id number]

## 2014-08-25 NOTE — Op Note (Signed)
[place], [address]   ENDOSCOPY PROCEDURE REPORT  PATIENT: Patrick Boyle, Patrick Boyle  MR#: [id number] BIRTHDATE: 08/05/1934 , 80  yrs. old GENDER: male ENDOSCOPIST: Wilford Corner, MD REFERRED BY:  hospital team PROCEDURE DATE:  2014-09-11 PROCEDURE:  EGD w/ biopsy ASA CLASS:     Class III INDICATIONS:  GI bleed; Anemia. MEDICATIONS: Fentanyl 25 mcg IV, Versed 1 mg IV, and Residual sedation present TOPICAL ANESTHETIC: none  DESCRIPTION OF PROCEDURE: After the risks benefits and alternatives of the procedure were thoroughly explained, informed consent was obtained.  The Liberal [id number] endoscope was introduced through the mouth and advanced to the second portion of the duodenum , Without limitations.  The instrument was slowly withdrawn as the mucosa was fully examined.    Esophagus normal; Stomach normal in appearance. Duodenal bulb mildly edematous focally (image 11). S/P biopsies of this edematous mucosa. Second portion of the duodenum normal in appearance. Retroflexed views revealed a small hiatal hernia.     The scope was then withdrawn from the patient and the procedure completed.  COMPLICATIONS: There were no immediate complications.  ENDOSCOPIC IMPRESSION:     Mild Duodenitis -s/p biopsies; Small hiatal hernia  RECOMMENDATIONS:     F/U on path; PPI QD   eSigned:  Wilford Corner, MD 2014-09-11 6:14 PM    CC:  CPT CODES: ICD CODES:  The ICD and CPT codes recommended by this software are interpretations from the data that the clinical staff has captured with the software.  The verification of the translation of this report to the ICD and CPT codes and modifiers is the sole responsibility of the health care institution and practicing physician where this report was generated.  [employer]. will not be held responsible for the validity of the ICD and CPT codes included on this report.  AMA assumes  no liability for data contained or not contained herein. CPT is a Designer, television/film set of the [employer].  PATIENT NAME:  Patrick Boyle, Patrick Boyle MR#: [id number]

## 2014-08-25 NOTE — Progress Notes (Signed)
Talked with Aaron Edelman from [facility], had been called for ICD check. States can not come tonight has 2 other called in [place] and [place]. States if pt goes home 12/27, he has a moniter at home and it will send them info  Then.

## 2014-08-25 NOTE — Brief Op Note (Signed)
Mild duodenitis seen on EGD. Four colon polyps removed. Sigmoid diverticulosis. Suspect bleeding was due to diverticulosis. No active bleeding or blood products seen. Soft carb modified diet and advance as tolerated. No additional GI workup needed at this time unless bleeding recurs. F/U on path as outpt.

## 2014-08-25 NOTE — H&P (View-Only) (Signed)
Referring Provider: Dr. Dyann Kief Primary Care Physician:  Abigail Miyamoto, MD Primary Gastroenterologist:  Althia Forts  Reason for Consultation:  GI bleed  HPI: Patrick Boyle is a 80 y.o. male seen for a consult due to the acute onset of rectal bleeding last night described as 3 large maroon colored stools overnight without associated hematemesis, abdominal pain or melena. He had food-colored vomitus yesterday as well. Reports dizziness yesterday. No further bleeding or BMs since last night. He reports having bloody loose stools last week that he attributes to the antibiotics that he was on. Had acute on chronic renal failure. Reports a normal colonoscopy in [place] in 2008. C. Diff negative on 08/21/14. Hgb 6.4 this morning (8.3).  Past Medical History  Diagnosis Date  . Diabetes mellitus     dr Olen Pel  . Hyperlipidemia   . Hypertension   . Coronary artery disease     s/p CABG 1997  . GERD (gastroesophageal reflux disease)   . Arthritis   . Allergic rhinitis   . Hyperplastic colon polyp   . Diverticulitis, colon   . Meniere's syndrome   . Tremor, essential     dr Jannifer Franklin  . COPD, mild     dr clance  . MI (myocardial infarction) 1997, 2005  . Ischemic cardiomyopathy     sp ICD (SJM) implanted by Dr Rayann Heman    Past Surgical History  Procedure Laterality Date  . Coronary artery bypass graft  [place]  . Cardiac catheterization  2005    medical therapy  . Cardiac defibrillator placement      ICD implanted by Dr Rayann Heman, Analyze ST study patient    Prior to Admission medications   Medication Sig Start Date End Date Taking? Authorizing Provider  Acetaminophen (ARTHRITIS PAIN RELIEF PO) Take 650 mg by mouth 2 (two) times daily.    Yes Historical Provider, MD  aspirin 325 MG tablet Take 325 mg by mouth daily.     Yes Historical Provider, MD  bimatoprost (LUMIGAN) 0.03 % ophthalmic solution Place 1 drop into both eyes at bedtime.    Yes Historical Provider, MD  calcitRIOL (ROCALTROL)  0.25 MCG capsule Take 0.25 mcg by mouth daily.   Yes Historical Provider, MD  cetirizine (ZYRTEC) 10 MG tablet Take 5 mg by mouth daily.     Yes Historical Provider, MD  cholecalciferol (VITAMIN D) 1000 UNITS tablet Take 1,000 Units by mouth daily.   Yes Historical Provider, MD  donepezil (ARICEPT) 10 MG tablet Take 10 mg by mouth at bedtime.   Yes Historical Provider, MD  fish oil-omega-3 fatty acids 1000 MG capsule Take 2 g by mouth 2 (two) times daily.    Yes Historical Provider, MD  Fluticasone Propionate, Inhal, (FLOVENT DISKUS) 100 MCG/BLIST AEPB Inhale 1 puff into the lungs daily.    Yes Historical Provider, MD  gabapentin (NEURONTIN) 300 MG capsule Take 1 capsule (300 mg total) by mouth 2 (two) times daily. 08/12/14  Yes Orson Eva, MD  glimepiride (AMARYL) 4 MG tablet Take 4 mg by mouth 2 (two) times daily.     Yes Historical Provider, MD  lansoprazole (PREVACID) 30 MG capsule Take 30 mg by mouth daily as needed (for acid reflex).    Yes Historical Provider, MD  levothyroxine (SYNTHROID, LEVOTHROID) 50 MCG tablet Take 50 mcg by mouth daily before breakfast.   Yes Historical Provider, MD  meclizine (ANTIVERT) 25 MG tablet Take 25 mg by mouth daily as needed (for vertigo).  Yes Historical Provider, MD  psyllium (REGULOID) 0.52 G capsule Take 0.52 g by mouth daily.   Yes Historical Provider, MD  simvastatin (ZOCOR) 40 MG tablet Take 40 mg by mouth at bedtime.     Yes Historical Provider, MD  sitaGLIPtin (JANUVIA) 50 MG tablet Take 50 mg by mouth daily with breakfast.   Yes Historical Provider, MD  sodium bicarbonate 650 MG tablet Take 1 tablet (650 mg total) by mouth 2 (two) times daily. Patient taking differently: Take 650 mg by mouth 3 (three) times daily.  08/12/14  Yes Orson Eva, MD  tamsulosin (FLOMAX) 0.4 MG CAPS capsule Take 1 capsule (0.4 mg total) by mouth daily after supper. 08/12/14  Yes Orson Eva, MD  traMADol (ULTRAM) 50 MG tablet Take 1 tablet by mouth every 6 (six) hours as  needed. For 10 days 08/12/14  Yes Historical Provider, MD  furosemide (LASIX) 20 MG tablet Take 20 mg by mouth every other day.     Historical Provider, MD    Scheduled Meds: . sodium chloride   Intravenous Once  . calcitRIOL  0.25 mcg Oral Daily  . cefTRIAXone (ROCEPHIN)  IV  1 g Intravenous Q24H  . cholecalciferol  1,000 Units Oral Daily  . donepezil  10 mg Oral QHS  . feeding supplement (RESOURCE BREEZE)  1 Container Oral BID BM  . fluticasone  1 puff Inhalation Daily  . gabapentin  300 mg Oral BID  . insulin aspart  0-9 Units Subcutaneous TID WC  . latanoprost  1 drop Both Eyes QHS  . levothyroxine  50 mcg Oral QAC breakfast  . linagliptin  5 mg Oral Daily  . loratadine  10 mg Oral Daily  . omega-3 acid ethyl esters  1 g Oral BID  . pantoprazole  40 mg Oral Daily  . psyllium  1 packet Oral Daily  . simvastatin  40 mg Oral QHS  . sodium bicarbonate  650 mg Oral TID  . tamsulosin  0.4 mg Oral QPC supper   Continuous Infusions:  PRN Meds:.acetaminophen **OR** acetaminophen, meclizine, ondansetron (ZOFRAN) IV, ondansetron, traMADol  Allergies as of 08/20/2014 - Review Complete 08/20/2014  Allergen Reaction Noted  . Metformin and related Other (See Comments)   . Ramipril Other (See Comments)     Family History  Problem Relation Age of Onset  . Parkinsonism Father   . Stomach cancer Mother   . Diabetes Mother     History   Social History  . Marital Status: Married    Spouse Name: N/A    Number of Children: N/A  . Years of Education: N/A   Occupational History  . retired    Social History Main Topics  . Smoking status: Former Smoker -- 35 years    Types: Cigarettes    Quit date: 09/01/1995  . Smokeless tobacco: Not on file  . Alcohol Use: 0.0 oz/week     Comment: occasional scotch  . Drug Use: No  . Sexual Activity: Not on file   Other Topics Concern  . Not on file   Social History Narrative   Pt lives in [place] with wife.   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  Physical Exam: Vital signs: Filed Vitals:   08/23/14 1315  BP: 114/62  Pulse: 85  Temp: 97.9 F (36.6 C)  Resp: 20   Last BM Date: 08/23/14 General:   Elderly,  lethargic, Well-developed, well-nourished, pleasant and cooperative in NAD HEENT: anicteric Neck: supple, nontender Lungs:  Clear throughout to auscultation.   No wheezes, crackles, or rhonchi. No acute distress. Heart:  Regular rate and rhythm; no murmurs, clicks, rubs,  or gallops. Abdomen: soft, nontender, nondistended, +BS  Rectal:  Deferred Ext: no edema  GI:  Lab Results:  Recent Labs  08/21/14 0500 08/22/14 0655 08/23/14 0425 08/23/14 1500  WBC 13.1* 6.5 5.7  --   HGB 10.4* 8.3* 6.4* 7.0*  HCT 31.9* 25.2* 19.2* 20.5*  PLT 187 160 142*  --    BMET  Recent Labs  08/21/14 0500 08/22/14 0655 08/23/14 0425  NA 136 141 142  K 4.1 3.8 5.2*  CL 108 114* 118*  CO2 18* 20 20  GLUCOSE 137* 101* 267*  BUN 37* 45* 59*  CREATININE 2.79* 2.56* 2.52*  CALCIUM 8.4 7.9* 7.9*   LFT  Recent Labs  08/20/14 2133  PROT 6.7  ALBUMIN 3.1*  AST 20  ALT 21  ALKPHOS 59  BILITOT 0.3   PT/INR No results for input(s): LABPROT, INR in the last 72 hours.   Studies/Results: No results found.  Impression/Plan:  80 yo with maroon stools X 3 last night and drop in Hgb to 6.4. Hemodynamically stable. No further bleeding. Suspect a diverticular bleed but ischemic colitis also high on the differential. Malignancy possible as well. Doubt peptic ulcer bleed. Colon prep tomorrow and EGD/colonoscopy on 08/25/14. Clear liquids tomorrow. NPO p MN on 08/25/14. Patient agreeable to proceed.   LOS: 3 days   [place] C.  08/23/2014, 4:48 PM

## 2014-08-25 NOTE — Progress Notes (Signed)
[facility] PROGRESS NOTE  Patrick Boyle OVF:[id number] DOB: 12/08/1933 DOA: 08/20/2014 PCP: Abigail Miyamoto, MD  Assessment/Plan: Sepsis secondary to klebsiella UTI -Patient was febrile, with leukocytosis and tachycardia upon admission -Will continue ceftriaxone for now; once able to take PO's properly will switch abx's to by mouth base on sensitivity and complete treatment. -Urine culture: klebsiella positive to rocephin -will continue abx's, supportive care and follow urine output  New Atrial Flutter -Resolved, likely secondary to sepsis and emesis -Patient had episode of emesis, followed by Atrial flutter -Currently rate controlled -TSH 1.877 -Magnesium repleted -ICD interrogated and functioning properly  Nausea/Vomiting -will continue PRN antiemetics   Urinary retention -Foley catheter reinserted 08/21/2014 -Spoke with Dr. Diona Fanti (on 08/21/2014), who recommended discharging patient with foley and he or a partner will see after discharge. -Continue flomax  Chronic Kidney Disease, Stage IV -Likely at baseline, at previous hospital admission was 3.4 and trended down to 2.8 upon discharge -Cr 2.64 -Continue to monitor closely; BMET in am  Diabetes Mellitus, type 2 with neuropathy -Continue gabapentin for neuropathy -will use SSI  Hyperlipidemia -Continue statin  GERD -Continue PPI  Chronic systolic CHF -Has ICD in place (2012) -Continue to monitor intake output and daily weights -Currently euvolemic and compensated -Denies SOB  Normocytic Anemia: in the 10's range at baseline: due to CKD and with acute blood loss anemia component from lower GI bleed -due to lower GI bleed -Will continue to monitor CBC -s/p transfusion of 3 units of PRBC's between 12/24 and 12/25 -Hgb stable at 8.4 today -GI on board and planning colonoscopy (12/26) -heparin discontinued; DVT prophylaxis with SCD's -continue holding ASA -diet NPO for procedure; to be advance as  recommended by GI after procedure   Code Status: Full  Code Status: Full Family Communication: wife and daughter at bedside Disposition Plan: to be determine    Consultants:  GI (Dr. Michail Sermon)  Procedures:  See below for x-ray report   Plan is for colonoscopy on 12/26  Antibiotics:  Rocephin 12/23  HPI/Subjective: Feeling ok. Patient is hungry and desperate to have procedure done. Hgb has remained stable and he didn't presented any overnight major bleeding.  Objective: Filed Vitals:   08/25/14 1640  BP: 173/60  Pulse: 58  Temp:   Resp: 23    Intake/Output Summary (Last 24 hours) at 08/25/14 1645 Last data filed at 08/25/14 1400  Gross per 24 hour  Intake 1480.75 ml  Output   2906 ml  Net -1425.25 ml   Filed Weights   08/23/14 0547 08/24/14 0529 08/25/14 0431  Weight: 86.229 kg (190 lb 1.6 oz) 87.5 kg (192 lb 14.4 oz) 88.6 kg (195 lb 5.2 oz)    Exam:   General:  Feeling ok and more oriented/conversative today (12/26); reports no further nausea or vomiting. Tolerated bowel prep w/o to much problems   Cardiovascular: S1 and S2, no rubs or gallops; positive SEM  Respiratory: CTA bilaterally  Abdomen: soft, positive BS, no guarding, no distension   Musculoskeletal: no edema or cyanosis  Data Reviewed: Basic Metabolic Panel:  Recent Labs Lab 08/20/14 2133 08/21/14 0500 08/21/14 1412 08/22/14 0655 08/23/14 0425 08/25/14 0521  NA 137 136  --  141 142 136  K 4.5 4.1  --  3.8 5.2* 3.9  CL 103 108  --  114* 118* 108  CO2 19 18*  --  20 20 21   GLUCOSE 114* 137*  --  101* 267* 152*  BUN 36* 37*  --  45* 59* 47*  CREATININE 2.70* 2.79*  --  2.56* 2.52* 2.64*  CALCIUM 9.0 8.4  --  7.9* 7.9* 7.9*  MG  --   --  1.5  --   --   --    Liver Function Tests:  Recent Labs Lab 08/20/14 2133  AST 20  ALT 21  ALKPHOS 59  BILITOT 0.3  PROT 6.7  ALBUMIN 3.1*   CBC:  Recent Labs Lab 08/20/14 2133 08/21/14 0500 08/22/14 0655 08/23/14 0425  08/23/14 1500 08/24/14 0432 08/25/14 0521  WBC 13.0* 13.1* 6.5 5.7  --  6.6 6.3  NEUTROABS 10.2*  --   --   --   --   --   --   HGB 10.0* 10.4* 8.3* 6.4* 7.0* 6.5* 8.4*  HCT 30.6* 31.9* 25.2* 19.2* 20.5* 18.5* 24.7*  MCV 95.3 96.4 96.2 94.1  --  95.9 92.5  PLT 187 187 160 142*  --  120* 114*   CBG:  Recent Labs Lab 08/24/14 1159 08/24/14 1649 08/24/14 2200 08/25/14 0805 08/25/14 1129  GLUCAP 259* 136* 111* 145* 142*    Recent Results (from the past 240 hour(s))  Urine culture     Status: None   Collection Time: 08/20/14  9:43 PM  Result Value Ref Range Status   Specimen Description URINE, CATHETERIZED  Final   Special Requests NONE  Final   Culture  Setup Time   Final    08/21/2014 04:23 Performed at [facility]   Final    >=100,000 COLONIES/ML Performed at [place] Performed at [facility]    Report Status 08/23/2014 FINAL  Final   Organism ID, Bacteria KLEBSIELLA PNEUMONIAE  Final      Susceptibility   Klebsiella pneumoniae - MIC*    AMPICILLIN >=32 RESISTANT Resistant     CEFAZOLIN <=4 SENSITIVE Sensitive     CEFTRIAXONE <=1 SENSITIVE Sensitive     CIPROFLOXACIN <=0.25 SENSITIVE Sensitive     GENTAMICIN <=1 SENSITIVE Sensitive     LEVOFLOXACIN <=0.12 SENSITIVE Sensitive     NITROFURANTOIN 64 INTERMEDIATE Intermediate     TOBRAMYCIN <=1 SENSITIVE Sensitive     TRIMETH/SULFA <=20 SENSITIVE Sensitive     PIP/TAZO <=4 SENSITIVE Sensitive     * KLEBSIELLA PNEUMONIAE  Clostridium Difficile by PCR     Status: None   Collection Time: 08/21/14  4:15 AM  Result Value Ref Range Status   C difficile by pcr NEGATIVE NEGATIVE Final    Comment: Performed at [facility]     Studies: No results found.  Scheduled Meds: . sodium chloride   Intravenous Once  . sodium chloride   Intravenous Once  . calcitRIOL  0.25 mcg Oral Daily  . cefTRIAXone (ROCEPHIN)  IV  1 g  Intravenous Q24H  . cholecalciferol  1,000 Units Oral Daily  . donepezil  10 mg Oral QHS  . feeding supplement (RESOURCE BREEZE)  1 Container Oral BID BM  . fluticasone  1 puff Inhalation Daily  . gabapentin  300 mg Oral BID  . insulin aspart  0-9 Units Subcutaneous TID WC  . latanoprost  1 drop Both Eyes QHS  . levothyroxine  50 mcg Oral QAC breakfast  . linagliptin  5 mg Oral Daily  . loratadine  10 mg Oral Daily  . omega-3 acid ethyl esters  1 g Oral BID  . pantoprazole  40 mg Oral Daily  .  psyllium  1 packet Oral Daily  . simvastatin  40 mg Oral QHS  . sodium bicarbonate  650 mg Oral TID  . tamsulosin  0.4 mg Oral QPC supper   Continuous Infusions: . sodium chloride 20 mL/hr at 08/24/14 0737    Principal Problem:   Sepsis secondary to UTI Active Problems:   Chronic systolic congestive heart failure   Acute on chronic renal failure   Acute cystitis without hematuria   Anemia   GI bleed    Time spent: 30 minutes    Barton Dubois MD [facility] Pager [phone]. If 7PM-7AM, please contact night-coverage at [email or web address], password [facility] 08/25/2014, 4:45 PM  LOS: 5 days

## 2014-08-26 DIAGNOSIS — K5791 Diverticulosis of intestine, part unspecified, without perforation or abscess with bleeding: Secondary | ICD-10-CM

## 2014-08-26 LAB — TYPE AND SCREEN
ABO/RH(D): O POS
Antibody Screen: NEGATIVE
Unit division: 0
Unit division: 0
Unit division: 0
Unit division: 0

## 2014-08-26 LAB — GLUCOSE, CAPILLARY
GLUCOSE-CAPILLARY: 143 mg/dL — AB (ref 70–99)
GLUCOSE-CAPILLARY: 206 mg/dL — AB (ref 70–99)
Glucose-Capillary: 153 mg/dL — ABNORMAL HIGH (ref 70–99)
Glucose-Capillary: 184 mg/dL — ABNORMAL HIGH (ref 70–99)

## 2014-08-26 NOTE — Progress Notes (Signed)
PROGRESS NOTE  Patrick Boyle HYW:[id number] DOB: 1934/02/11 DOA: 08/20/2014 PCP: Abigail Miyamoto, MD  Assessment/Plan: Sepsis secondary to klebsiella UTI -Patient was febrile, with leukocytosis and tachycardia upon admission -Will continue ceftriaxone for now; once able to take PO's properly will switch abx's to by mouth base on sensitivity and complete treatment. -Urine culture: klebsiella sensitive to rocephin -will continue abx's, supportive care and follow urine output  New Atrial Flutter -Resolved, likely secondary to sepsis and emesis -Patient had episode of emesis, followed by Atrial flutter -Currently rate controlled -TSH 1.877 -Magnesium repleted -ICD interrogated and functioning properly  Nausea/Vomiting -will continue PRN antiemetics   Urinary retention -Foley catheter reinserted 08/21/2014 -Spoke with Dr. Diona Fanti (on 08/21/2014), who recommended discharging patient with foley and he or a partner will see after discharge. -Continue flomax  Chronic Kidney Disease, Stage IV -Likely at baseline, at previous hospital admission was 3.4 and trended down to 2.8 upon discharge -Cr 2.64 -Continue to monitor closely; BMET in am  Diabetes Mellitus, type 2 with neuropathy -Continue gabapentin for neuropathy -will use SSI  Hyperlipidemia -Continue statin  GERD -Continue PPI  Chronic systolic CHF -Has ICD in place (2012) -Continue to monitor intake output and daily weights -Currently euvolemic and compensated -Denies SOB  Normocytic Anemia: in the 10's range at baseline: due to CKD and with acute blood loss anemia component from lower GI bleed -due to lower GI bleed -Will continue to monitor CBC -s/p transfusion of 3 units of PRBC's between 12/24 and 12/25 -Hgb stable at 8.4 today -colonoscopy--likely diverticular bleed -EGD--duodenitis -heparin discontinued; DVT prophylaxis with SCD's -continue holding ASA -diet NPO for procedure; to be advance as  recommended by GI after procedure   Code Status: Full Family Communication: wife and daughter at bedside Disposition Plan: 08/26/14 if stable        Procedures/Studies: Ct Abdomen Pelvis Wo Contrast  08/20/2014   CLINICAL DATA:  Acute onset of generalized weakness for 4 days. Fever. Initial encounter.  EXAM: CT ABDOMEN AND PELVIS WITHOUT CONTRAST  TECHNIQUE: Multidetector CT imaging of the abdomen and pelvis was performed following the standard protocol without IV contrast.  COMPARISON:  Renal ultrasound performed 08/08/2014  FINDINGS: Minimal right basilar atelectasis is noted. Diffuse coronary artery calcification is noted. A pacemaker lead is partially imaged.  The liver and spleen are unremarkable in appearance. The patient is status post cholecystectomy, with clips noted at the gallbladder fossa. The pancreas and adrenal glands are unremarkable.  Nonspecific perinephric stranding is noted bilaterally. The kidneys are otherwise unremarkable. There is no evidence of hydronephrosis. No renal or ureteral stones are seen.  No free fluid is identified. The small bowel is unremarkable in appearance. The stomach is within normal limits. No acute vascular abnormalities are seen. Scattered calcification is seen along the abdominal aorta and its branches. There is borderline ectasia of the distal abdominal aorta.  The appendix unremarkable in appearance, though difficult to fully assess. There is no evidence for appendicitis. The colon is largely filled with fluid. Diffuse diverticulosis is noted along the descending and sigmoid colon, without evidence for diverticulitis.  The bladder is mildly distended. Scattered foci of air are seen within the bladder, of uncertain significance. There is focal anterior bladder wall thickening, and several small stones are seen dependently within the bladder. Would correlate for recent Foley catheter placement. The prostate is enlarged, measuring 5.6 cm in transverse  dimension. No inguinal lymphadenopathy is seen.  No acute osseous  abnormalities are identified. The patient's right hip arthroplasty is grossly unremarkable in appearance, though incompletely imaged. Degenerative joint space narrowing and subcortical cystic change is noted at the left hip joint.  IMPRESSION: 1. Scattered foci of air noted within the bladder, of uncertain significance. Would correlate for recent Foley catheter placement. If no Foley catheter has recently been placed, emphysematous cystitis could have such an appearance. 2. Focal anterior bladder wall thickening, and several small stones dependently within the bladder. An underlying mass cannot be excluded. Would consider cystoscopy for further evaluation, when and as deemed clinically appropriate. 3. Enlarged prostate noted. 4. Scattered calcification along the abdominal aorta and its branches. 5. Diffuse diverticulosis along the descending and sigmoid colon, without evidence of diverticulitis. 6. Diffuse coronary artery calcification noted. 7. Degenerative joint space narrowing and subcortical cystic change at the left hip.   Electronically Signed   By: Garald Balding M.D.   On: 08/20/2014 23:48   US Renal  08/08/2014   CLINICAL DATA:  80 year old male with acute renal failure. Initial encounter. Current history of Chronic kidney disease.  EXAM: RENAL/URINARY TRACT ULTRASOUND COMPLETE  COMPARISON:  05/15/2014.  FINDINGS: Right Kidney:  Length: 9.5 cm. Unchanged upper pole simple cyst measuring up to 2.0 cm. Echogenicity Stable and within normal limits. No mass or hydronephrosis visualized.  Left Kidney:  Length: 9.7 cm. Echogenicity Stable and within normal limits. No mass or hydronephrosis visualized.  Bladder:  Appears normal for degree of bladder distention. Bilateral ureteral jets detected.  IMPRESSION: Stable sonographic appearance of the kidneys since September, no obstructive uropathy.   Electronically Signed   By: Lars Pinks M.D.   On:  08/08/2014 13:24   Dg Chest Port 1 View  08/08/2014   CLINICAL DATA:  80 year old male with fever weakness and diarrhea for 4 days. Initial encounter.  EXAM: PORTABLE CHEST - 1 VIEW  COMPARISON:  07/21/2013 and earlier.  FINDINGS: Portable AP upright view at 1018 hrs. Stable left chest single lead cardiac AICD. Sequelae of median sternotomy appears stable. Stable cardiomegaly and mediastinal contours. Lung volumes not significantly changed with no pneumothorax, pulmonary edema, pleural effusion or confluent pulmonary opacity.  IMPRESSION: No acute cardiopulmonary abnormality.   Electronically Signed   By: Lars Pinks M.D.   On: 08/08/2014 10:29         Subjective: Patient denies fevers, chills, headache, chest pain, dyspnea, nausea, vomiting, diarrhea, abdominal pain, dysuria, hematuria   Objective: Filed Vitals:   08/25/14 1755 08/25/14 2150 08/26/14 0530 08/26/14 0857  BP: 130/56 132/78 117/43   Pulse: 57 89 67   Temp: 97.7 F (36.5 C) 97.6 F (36.4 C) 97.9 F (36.6 C)   TempSrc: Oral Oral Oral   Resp: 20 20 20    Height:      Weight:   88.6 kg (195 lb 5.2 oz)   SpO2: 99% 98% 96% 96%    Intake/Output Summary (Last 24 hours) at 08/26/14 1647 Last data filed at 08/26/14 1500  Gross per 24 hour  Intake    560 ml  Output      0 ml  Net    560 ml   Weight change: 0 kg (0 lb) Exam:   General:  Pt is alert, follows commands appropriately, not in acute distress  HEENT: No icterus, No thrush, /AT  Cardiovascular: RRR, S1/S2, no rubs, no gallops  Respiratory: CTA bilaterally, no wheezing, no crackles, no rhonchi  Abdomen: Soft/+BS, non tender, non distended, no guarding  Extremities: 1+LE edema, No lymphangitis,  No petechiae, No rashes, no synovitis  Data Reviewed: Basic Metabolic Panel:  Recent Labs Lab 08/20/14 2133 08/21/14 0500 08/21/14 1412 08/22/14 0655 08/23/14 0425 08/25/14 0521  NA 137 136  --  141 142 136  K 4.5 4.1  --  3.8 5.2* 3.9  CL 103 108  --   114* 118* 108  CO2 19 18*  --  20 20 21   GLUCOSE 114* 137*  --  101* 267* 152*  BUN 36* 37*  --  45* 59* 47*  CREATININE 2.70* 2.79*  --  2.56* 2.52* 2.64*  CALCIUM 9.0 8.4  --  7.9* 7.9* 7.9*  MG  --   --  1.5  --   --   --    Liver Function Tests:  Recent Labs Lab 08/20/14 2133  AST 20  ALT 21  ALKPHOS 59  BILITOT 0.3  PROT 6.7  ALBUMIN 3.1*   No results for input(s): LIPASE, AMYLASE in the last 168 hours. No results for input(s): AMMONIA in the last 168 hours. CBC:  Recent Labs Lab 08/20/14 2133 08/21/14 0500 08/22/14 0655 08/23/14 0425 08/23/14 1500 08/24/14 0432 08/25/14 0521  WBC 13.0* 13.1* 6.5 5.7  --  6.6 6.3  NEUTROABS 10.2*  --   --   --   --   --   --   HGB 10.0* 10.4* 8.3* 6.4* 7.0* 6.5* 8.4*  HCT 30.6* 31.9* 25.2* 19.2* 20.5* 18.5* 24.7*  MCV 95.3 96.4 96.2 94.1  --  95.9 92.5  PLT 187 187 160 142*  --  120* 114*   Cardiac Enzymes: No results for input(s): CKTOTAL, CKMB, CKMBINDEX, TROPONINI in the last 168 hours. BNP: Invalid input(s): POCBNP CBG:  Recent Labs Lab 08/25/14 1129 08/25/14 1835 08/25/14 2148 08/26/14 0827 08/26/14 1154  GLUCAP 142* 111* 215* 153* 184*    Recent Results (from the past 240 hour(s))  Urine culture     Status: None   Collection Time: 08/20/14  9:43 PM  Result Value Ref Range Status   Specimen Description URINE, CATHETERIZED  Final   Special Requests NONE  Final   Culture  Setup Time   Final    08/21/2014 04:23 Performed at [facility]   Final    >=100,000 COLONIES/ML Performed at [facility] Performed at [facility]    Report Status 08/23/2014 FINAL  Final   Organism ID, Bacteria KLEBSIELLA PNEUMONIAE  Final      Susceptibility   Klebsiella pneumoniae - MIC*    AMPICILLIN >=32 RESISTANT Resistant     CEFAZOLIN <=4 SENSITIVE Sensitive     CEFTRIAXONE <=1 SENSITIVE Sensitive     CIPROFLOXACIN <=0.25 SENSITIVE  Sensitive     GENTAMICIN <=1 SENSITIVE Sensitive     LEVOFLOXACIN <=0.12 SENSITIVE Sensitive     NITROFURANTOIN 64 INTERMEDIATE Intermediate     TOBRAMYCIN <=1 SENSITIVE Sensitive     TRIMETH/SULFA <=20 SENSITIVE Sensitive     PIP/TAZO <=4 SENSITIVE Sensitive     * KLEBSIELLA PNEUMONIAE  Clostridium Difficile by PCR     Status: None   Collection Time: 08/21/14  4:15 AM  Result Value Ref Range Status   C difficile by pcr NEGATIVE NEGATIVE Final    Comment: Performed at [facility]     Scheduled Meds: . sodium chloride   Intravenous Once  . sodium chloride   Intravenous Once  . calcitRIOL  0.25 mcg Oral Daily  . cefTRIAXone (ROCEPHIN)  IV  1 g Intravenous Q24H  . cholecalciferol  1,000 Units Oral Daily  . donepezil  10 mg Oral QHS  . feeding supplement (RESOURCE BREEZE)  1 Container Oral BID BM  . fluticasone  1 puff Inhalation Daily  . gabapentin  300 mg Oral BID  . insulin aspart  0-9 Units Subcutaneous TID WC  . latanoprost  1 drop Both Eyes QHS  . levothyroxine  50 mcg Oral QAC breakfast  . linagliptin  5 mg Oral Daily  . loratadine  10 mg Oral Daily  . omega-3 acid ethyl esters  1 g Oral BID  . pantoprazole  40 mg Oral Daily  . psyllium  1 packet Oral Daily  . simvastatin  40 mg Oral QHS  . sodium bicarbonate  650 mg Oral TID  . tamsulosin  0.4 mg Oral QPC supper   Continuous Infusions: . sodium chloride 500 mL (08/25/14 1745)     Mateusz Neilan, DO  [facility] Pager [phone]  If 7PM-7AM, please contact night-coverage [email or web address] Password [facility] 08/26/2014, 4:47 PM   LOS: 6 days

## 2014-08-26 NOTE — Progress Notes (Signed)
Physical Therapy Treatment Patient Details Name: Patrick Boyle MRN: [id number] DOB: 01-26-1934 Today's Date: 09/18/2014    History of Present Illness 80 yo male admitted with sepsis due to UTI. Recent admit ~08/11/14 for N/V/D, acute renal failure. hx of periphernal neuropathy.     PT Comments    Pt tolerated session well. Pt hoping to go home soon.    Follow Up Recommendations  Home health PT     Equipment Recommendations   (pt states he has a rW at home)    Recommendations for Other Services       Precautions / Restrictions Precautions Precautions: Fall Restrictions Weight Bearing Restrictions: No    Mobility  Bed Mobility               General bed mobility comments: no tested, pt up in chari  Transfers Overall transfer level: Needs assistance Equipment used: Rolling walker (2 wheeled) Transfers: Sit to/from Stand Sit to Stand: From elevated surface;Min guard         General transfer comment: continued close guard for safety due to intial steps without RW not steady and needed the RW to be able to ambulate. Spoke to pt to use his RW when he returns home until he regains his strength and balance back.   Ambulation/Gait Ambulation/Gait assistance: Min guard Ambulation Distance (Feet): 200 Feet Assistive device: Rolling walker (2 wheeled) Gait Pattern/deviations: Step-through pattern Gait velocity: moderate   General Gait Details: feel pt needs RW to steady and for fatigue.    Stairs            Wheelchair Mobility    Modified Rankin (Stroke Patients Only)       Balance                                    Cognition Arousal/Alertness: Awake/alert Behavior During Therapy: WFL for tasks assessed/performed Overall Cognitive Status: Within Functional Limits for tasks assessed                      Exercises      General Comments        Pertinent Vitals/Pain Pain Assessment: No/denies pain    Home Living                       Prior Function            PT Goals (current goals can now be found in the care plan section) Acute Rehab PT Goals Patient Stated Goal: feeling better and hoping to go home soon PT Goal Formulation: With patient Time For Goal Achievement: 09/04/14 Potential to Achieve Goals: Good Progress towards PT goals: Progressing toward goals    Frequency  Min 3X/week    PT Plan Current plan remains appropriate    Co-evaluation             End of Session Equipment Utilized During Treatment: Gait belt Activity Tolerance: Patient tolerated treatment well Patient left: with call bell/phone within reach;with bed alarm set;in chair     Time:  -     Charges:  $Gait Training: 8-22 mins                    G CodesClide Dales 09-18-2014, 1:59 PM Clide Dales, PT Pager: [phone] 2014-09-18

## 2014-08-27 ENCOUNTER — Encounter (HOSPITAL_COMMUNITY): Payer: Self-pay | Admitting: Gastroenterology

## 2014-08-27 LAB — CBC
HCT: 24.6 % — ABNORMAL LOW (ref 39.0–52.0)
Hemoglobin: 8.4 g/dL — ABNORMAL LOW (ref 13.0–17.0)
MCH: 31.6 pg (ref 26.0–34.0)
MCHC: 34.1 g/dL (ref 30.0–36.0)
MCV: 92.5 fL (ref 78.0–100.0)
Platelets: 133 10*3/uL — ABNORMAL LOW (ref 150–400)
RBC: 2.66 MIL/uL — ABNORMAL LOW (ref 4.22–5.81)
RDW: 13.7 % (ref 11.5–15.5)
WBC: 6.6 10*3/uL (ref 4.0–10.5)

## 2014-08-27 LAB — BASIC METABOLIC PANEL
ANION GAP: 5 (ref 5–15)
BUN: 36 mg/dL — ABNORMAL HIGH (ref 6–23)
CALCIUM: 7.9 mg/dL — AB (ref 8.4–10.5)
CO2: 22 mmol/L (ref 19–32)
Chloride: 110 mEq/L (ref 96–112)
Creatinine, Ser: 2.43 mg/dL — ABNORMAL HIGH (ref 0.50–1.35)
GFR, EST AFRICAN AMERICAN: 28 mL/min — AB (ref >90)
GFR, EST NON AFRICAN AMERICAN: 24 mL/min — AB (ref >90)
Glucose, Bld: 171 mg/dL — ABNORMAL HIGH (ref 70–99)
Potassium: 3.8 mmol/L (ref 3.5–5.1)
SODIUM: 137 mmol/L (ref 135–145)

## 2014-08-27 LAB — GLUCOSE, CAPILLARY: GLUCOSE-CAPILLARY: 165 mg/dL — AB (ref 70–99)

## 2014-08-27 MED ORDER — FLUTICASONE PROPIONATE HFA 110 MCG/ACT IN AERO
1.0000 | INHALATION_SPRAY | Freq: Every day | RESPIRATORY_TRACT | Status: DC
  Administered 2014-08-27: 1 via RESPIRATORY_TRACT

## 2014-08-27 NOTE — Progress Notes (Signed)
CARE MANAGEMENT NOTE 08/27/2014  Patient:  Patrick Boyle,Patrick Boyle   Account Number:  [id number]  Date Initiated:  08/21/2014  Documentation initiated by:  Dessa Phi  Subjective/Objective Assessment:   80 y/o m admitted w/sepsis-uti.Readmit-12/9-12/13-aki/pbstructive uropathy.     Action/Plan:   From home.   Anticipated DC Date:  08/24/2014   Anticipated DC Plan:  [place]  CM consult      Choice offered to / List presented to:          [facility] arranged  [place].   Status of service:  Completed, signed off Medicare Important Message given?  YES (If response is "NO", the following Medicare IM given date fields will be blank) Date Medicare IM given:  08/23/2014 Medicare IM given by:  Edwyna Shell Date Additional Medicare IM given:  08/27/2014 Additional Medicare IM given by:  Edwyna Shell  Discharge Disposition:  [place]  Per UR Regulation:  Reviewed for med. necessity/level of care/duration of stay  If discussed at [place] of Stay Meetings, dates discussed:    Comments:  08/27/14 Edwyna Shell RN BSN CM [phone] L/M for [facility] liaison that patient will be dc'd today with [facility] PT, patient aware [facility] PT will follow upon dc.  08/21/14 Dessa Phi RN BSN NCM [place] aware[place] PT recommendations.

## 2014-08-27 NOTE — Discharge Summary (Signed)
Physician Discharge Summary  Le Faulcon UXL:[id number] DOB: 02-04-1934 DOA: 08/20/2014  PCP: Abigail Miyamoto, MD  Admit date: 08/20/2014 Discharge date: 08/27/2014  Recommendations for Outpatient Follow-up:  1. Pt will need to follow up with PCP in 2 weeks post discharge 2. Please obtain BMP and CBC in one week   Discharge Diagnoses:  Sepsis secondary to klebsiella UTI -Patient was febrile, with leukocytosis and tachycardia upon admission -Will continue ceftriaxone during the hospitalization -Urine culture: klebsiella sensitive to rocephin -The patient finished 7 days of intravenous ceftriaxone during the hospitalization  New Atrial Flutter -Resolved, likely secondary to sepsis and emesis -Patient had episode of emesis, followed by Atrial flutter -Currently rate controlled -TSH 1.877 -Magnesium repleted -ICD interrogated and functioning properly -This was a transient episode and the patient reverted to sinus rhythm and remained in sinus rhythm for the duration of the hospitalization -Continue aspirin daily  Nausea/Vomiting -will continue PRN antiemetics  -This improved after admission with treatment of his infectious process -His diet was advanced and he tolerated it eating 100% of his meals  Urinary retention/Bladder Outlet Obstruction -Foley catheter reinserted 08/21/2014 -Spoke with Dr. Diona Fanti (on 08/21/2014), who recommended discharging patient with foley and he or a partner will see after discharge. -Continue flomax  Chronic Kidney Disease, Stage IV -Likely at baseline, at previous hospital admission was 3.4 and trended down to 2.8 upon discharge -Cr 2.43 on the day of discharge -BMP in 1 week -Patient will follow up with his nephrologist, Dr. Lorrene Reid in 2-3 weeks  Diabetes Mellitus, type 2 with neuropathy -Continue gabapentin for neuropathy -will use SSI -Hemoglobin A1c on 08/12/2014 7.7 -d/c home on his usual amaryl and Januvia Hyperlipidemia -Continue  statin  GERD -Continue PPI  Chronic systolic CHF -Has ICD in place (2012) -Continue to monitor intake output and daily weights -Currently euvolemic and compensated -Denies SOB  Normocytic Anemia: in the 10's range at baseline: due to CKD and with acute blood loss anemia component from lower GI bleed -due to lower GI bleed -Will continue to monitor CBC -s/p transfusion of 3 units of PRBC's between 12/24 and 12/25 -colonoscopy--likely diverticular bleed -EGD--duodenitis -heparin discontinued; DVT prophylaxis with SCD's -continue holding ASA -diet NPO for procedure; to be advance as recommended by GI after procedure -The patient's hemoglobin remained stable after this transfusion. It was 8.4 on the day of discharge -He will resume his aspirin on 08/29/2014-  Discharge Condition: Stable Disposition: home Follow-up Information    Follow up with [facility].   Why:  [facility]   Contact information:   [place] [address] [phone]       Diet:carb modified Wt Readings from Last 3 Encounters:  08/27/14 87.1 kg (192 lb 0.3 oz)  08/08/14 88.6 kg (195 lb 5.2 oz)  02/22/14 84.823 kg (187 lb)    History of present illness:  80 year old male with a history of diabetes mellitus, hypertension, CAD, ischemic cardiomyopathy with ICD, COPD, and CKD stage III with baseline Scr of 1.7-2.19 over the last 2 years presented with 3 week history of progressive weakness and anorexia .  He presented on 08/21/2014 with fever and generalized weakness. The patient was recently discharged from the hospital after suffering a fever of which the workup was negative. He also had a bladder outlet obstruction and was discharged home with a Foley catheter. His creatinine improved from 3.4-2.8 on the day of discharge. He presented on 08/21/2014 with a fever.  Consultants: GI--Dr. Michail Sermon  Discharge Exam: Filed  Vitals:   08/27/14 0637  BP: 129/68  Pulse: 89  Temp:  98.7 F (37.1 C)  Resp: 20   Filed Vitals:   08/26/14 1700 08/26/14 2120 08/27/14 0637 08/27/14 0818  BP: 153/56 139/58 129/68   Pulse: 69 69 89   Temp: 98.7 F (37.1 C) 98.7 F (37.1 C) 98.7 F (37.1 C)   TempSrc: Oral Oral Oral   Resp: 18 20 20    Height:      Weight:   87.1 kg (192 lb 0.3 oz)   SpO2: 100% 97% 96% 97%   General: A&O x 3, NAD, pleasant, cooperative Cardiovascular: RRR, no rub, no gallop, no S3 Respiratory: CTAB, no wheeze, no rhonchi Abdomen:soft, nontender, nondistended, positive bowel sounds Extremities: Trace edema, No lymphangitis, no petechiae  Discharge Instructions     Medication List    ASK your doctor about these medications        ARTHRITIS PAIN RELIEF PO  Take 650 mg by mouth 2 (two) times daily.     aspirin 325 MG tablet  Take 325 mg by mouth daily.     bimatoprost 0.03 % ophthalmic solution  Commonly known as:  LUMIGAN  Place 1 drop into both eyes at bedtime.     calcitRIOL 0.25 MCG capsule  Commonly known as:  ROCALTROL  Take 0.25 mcg by mouth daily.     cetirizine 10 MG tablet  Commonly known as:  ZYRTEC  Take 5 mg by mouth daily.     cholecalciferol 1000 UNITS tablet  Commonly known as:  VITAMIN D  Take 1,000 Units by mouth daily.     donepezil 10 MG tablet  Commonly known as:  ARICEPT  Take 10 mg by mouth at bedtime.     fish oil-omega-3 fatty acids 1000 MG capsule  Take 2 g by mouth 2 (two) times daily.     FLOVENT DISKUS 100 MCG/BLIST Aepb  Generic drug:  Fluticasone Propionate (Inhal)  Inhale 1 puff into the lungs daily.     furosemide 20 MG tablet  Commonly known as:  LASIX  Take 20 mg by mouth every other day.     gabapentin 300 MG capsule  Commonly known as:  NEURONTIN  Take 1 capsule (300 mg total) by mouth 2 (two) times daily.     glimepiride 4 MG tablet  Commonly known as:  AMARYL  Take 4 mg by mouth 2 (two) times daily.     lansoprazole 30 MG capsule  Commonly known as:  PREVACID  Take 30 mg by  mouth daily as needed (for acid reflex).     levothyroxine 50 MCG tablet  Commonly known as:  SYNTHROID, LEVOTHROID  Take 50 mcg by mouth daily before breakfast.     meclizine 25 MG tablet  Commonly known as:  ANTIVERT  Take 25 mg by mouth daily as needed (for vertigo).     psyllium 0.52 G capsule  Commonly known as:  REGULOID  Take 0.52 g by mouth daily.     simvastatin 40 MG tablet  Commonly known as:  ZOCOR  Take 40 mg by mouth at bedtime.     sitaGLIPtin 50 MG tablet  Commonly known as:  JANUVIA  Take 50 mg by mouth daily with breakfast.     sodium bicarbonate 650 MG tablet  Take 1 tablet (650 mg total) by mouth 2 (two) times daily.     tamsulosin 0.4 MG Caps capsule  Commonly known as:  FLOMAX  Take 1 capsule (0.4  mg total) by mouth daily after supper.     traMADol 50 MG tablet  Commonly known as:  ULTRAM  Take 1 tablet by mouth every 6 (six) hours as needed. For 10 days         The results of significant diagnostics from this hospitalization (including imaging, microbiology, ancillary and laboratory) are listed below for reference.    Significant Diagnostic Studies: Ct Abdomen Pelvis Wo Contrast  08/20/2014   CLINICAL DATA:  Acute onset of generalized weakness for 4 days. Fever. Initial encounter.  EXAM: CT ABDOMEN AND PELVIS WITHOUT CONTRAST  TECHNIQUE: Multidetector CT imaging of the abdomen and pelvis was performed following the standard protocol without IV contrast.  COMPARISON:  Renal ultrasound performed 08/08/2014  FINDINGS: Minimal right basilar atelectasis is noted. Diffuse coronary artery calcification is noted. A pacemaker lead is partially imaged.  The liver and spleen are unremarkable in appearance. The patient is status post cholecystectomy, with clips noted at the gallbladder fossa. The pancreas and adrenal glands are unremarkable.  Nonspecific perinephric stranding is noted bilaterally. The kidneys are otherwise unremarkable. There is no evidence of  hydronephrosis. No renal or ureteral stones are seen.  No free fluid is identified. The small bowel is unremarkable in appearance. The stomach is within normal limits. No acute vascular abnormalities are seen. Scattered calcification is seen along the abdominal aorta and its branches. There is borderline ectasia of the distal abdominal aorta.  The appendix unremarkable in appearance, though difficult to fully assess. There is no evidence for appendicitis. The colon is largely filled with fluid. Diffuse diverticulosis is noted along the descending and sigmoid colon, without evidence for diverticulitis.  The bladder is mildly distended. Scattered foci of air are seen within the bladder, of uncertain significance. There is focal anterior bladder wall thickening, and several small stones are seen dependently within the bladder. Would correlate for recent Foley catheter placement. The prostate is enlarged, measuring 5.6 cm in transverse dimension. No inguinal lymphadenopathy is seen.  No acute osseous abnormalities are identified. The patient's right hip arthroplasty is grossly unremarkable in appearance, though incompletely imaged. Degenerative joint space narrowing and subcortical cystic change is noted at the left hip joint.  IMPRESSION: 1. Scattered foci of air noted within the bladder, of uncertain significance. Would correlate for recent Foley catheter placement. If no Foley catheter has recently been placed, emphysematous cystitis could have such an appearance. 2. Focal anterior bladder wall thickening, and several small stones dependently within the bladder. An underlying mass cannot be excluded. Would consider cystoscopy for further evaluation, when and as deemed clinically appropriate. 3. Enlarged prostate noted. 4. Scattered calcification along the abdominal aorta and its branches. 5. Diffuse diverticulosis along the descending and sigmoid colon, without evidence of diverticulitis. 6. Diffuse coronary artery  calcification noted. 7. Degenerative joint space narrowing and subcortical cystic change at the left hip.   Electronically Signed   By: Garald Balding M.D.   On: 08/20/2014 23:48   US Renal  08/08/2014   CLINICAL DATA:  80 year old male with acute renal failure. Initial encounter. Current history of Chronic kidney disease.  EXAM: RENAL/URINARY TRACT ULTRASOUND COMPLETE  COMPARISON:  05/15/2014.  FINDINGS: Right Kidney:  Length: 9.5 cm. Unchanged upper pole simple cyst measuring up to 2.0 cm. Echogenicity Stable and within normal limits. No mass or hydronephrosis visualized.  Left Kidney:  Length: 9.7 cm. Echogenicity Stable and within normal limits. No mass or hydronephrosis visualized.  Bladder:  Appears normal for degree of bladder distention. Bilateral  ureteral jets detected.  IMPRESSION: Stable sonographic appearance of the kidneys since September, no obstructive uropathy.   Electronically Signed   By: Lars Pinks M.D.   On: 08/08/2014 13:24   Dg Chest Port 1 View  08/08/2014   CLINICAL DATA:  80 year old male with fever weakness and diarrhea for 4 days. Initial encounter.  EXAM: PORTABLE CHEST - 1 VIEW  COMPARISON:  07/21/2013 and earlier.  FINDINGS: Portable AP upright view at 1018 hrs. Stable left chest single lead cardiac AICD. Sequelae of median sternotomy appears stable. Stable cardiomegaly and mediastinal contours. Lung volumes not significantly changed with no pneumothorax, pulmonary edema, pleural effusion or confluent pulmonary opacity.  IMPRESSION: No acute cardiopulmonary abnormality.   Electronically Signed   By: Lars Pinks M.D.   On: 08/08/2014 10:29     Microbiology: Recent Results (from the past 240 hour(s))  Urine culture     Status: None   Collection Time: 08/20/14  9:43 PM  Result Value Ref Range Status   Specimen Description URINE, CATHETERIZED  Final   Special Requests NONE  Final   Culture  Setup Time   Final    08/21/2014 04:23 Performed at [facility]   Final    >=100,000 COLONIES/ML Performed at [facility] Performed at [facility]    Report Status 08/23/2014 FINAL  Final   Organism ID, Bacteria KLEBSIELLA PNEUMONIAE  Final      Susceptibility   Klebsiella pneumoniae - MIC*    AMPICILLIN >=32 RESISTANT Resistant     CEFAZOLIN <=4 SENSITIVE Sensitive     CEFTRIAXONE <=1 SENSITIVE Sensitive     CIPROFLOXACIN <=0.25 SENSITIVE Sensitive     GENTAMICIN <=1 SENSITIVE Sensitive     LEVOFLOXACIN <=0.12 SENSITIVE Sensitive     NITROFURANTOIN 64 INTERMEDIATE Intermediate     TOBRAMYCIN <=1 SENSITIVE Sensitive     TRIMETH/SULFA <=20 SENSITIVE Sensitive     PIP/TAZO <=4 SENSITIVE Sensitive     * KLEBSIELLA PNEUMONIAE  Clostridium Difficile by PCR     Status: None   Collection Time: 08/21/14  4:15 AM  Result Value Ref Range Status   C difficile by pcr NEGATIVE NEGATIVE Final    Comment: Performed at [facility]: Basic Metabolic Panel:  Recent Labs Lab 08/21/14 0500 08/21/14 1412 08/22/14 0655 08/23/14 0425 08/25/14 0521 08/27/14 0428  NA 136  --  141 142 136 137  K 4.1  --  3.8 5.2* 3.9 3.8  CL 108  --  114* 118* 108 110  CO2 18*  --  20 20 21 22   GLUCOSE 137*  --  101* 267* 152* 171*  BUN 37*  --  45* 59* 47* 36*  CREATININE 2.79*  --  2.56* 2.52* 2.64* 2.43*  CALCIUM 8.4  --  7.9* 7.9* 7.9* 7.9*  MG  --  1.5  --   --   --   --    Liver Function Tests:  Recent Labs Lab 08/20/14 2133  AST 20  ALT 21  ALKPHOS 59  BILITOT 0.3  PROT 6.7  ALBUMIN 3.1*   No results for input(s): LIPASE, AMYLASE in the last 168 hours. No results for input(s): AMMONIA in the last 168 hours. CBC:  Recent Labs Lab 08/20/14 2133  08/22/14 0655 08/23/14 0425 08/23/14 1500 08/24/14 0432 08/25/14 0521 08/27/14 0428  WBC 13.0*  < >  6.5 5.7  --  6.6 6.3 6.6  NEUTROABS 10.2*  --   --   --   --   --   --   --   HGB 10.0*  < > 8.3* 6.4* 7.0* 6.5*  8.4* 8.4*  HCT 30.6*  < > 25.2* 19.2* 20.5* 18.5* 24.7* 24.6*  MCV 95.3  < > 96.2 94.1  --  95.9 92.5 92.5  PLT 187  < > 160 142*  --  120* 114* 133*  < > = values in this interval not displayed. Cardiac Enzymes: No results for input(s): CKTOTAL, CKMB, CKMBINDEX, TROPONINI in the last 168 hours. BNP: Invalid input(s): POCBNP CBG:  Recent Labs Lab 08/26/14 0827 08/26/14 1154 08/26/14 1715 08/26/14 2110 08/27/14 0746  GLUCAP 153* 184* 143* 206* 165*    Time coordinating discharge:  Greater than 30 minutes  Signed:  Boby Eyer, DO [facility] Pager: [phone] 08/27/2014, 9:18 AM

## 2014-08-28 DIAGNOSIS — E119 Type 2 diabetes mellitus without complications: Secondary | ICD-10-CM | POA: Diagnosis not present

## 2014-08-28 DIAGNOSIS — N189 Chronic kidney disease, unspecified: Secondary | ICD-10-CM | POA: Diagnosis not present

## 2014-08-28 DIAGNOSIS — T368X5D Adverse effect of other systemic antibiotics, subsequent encounter: Secondary | ICD-10-CM | POA: Diagnosis not present

## 2014-08-28 DIAGNOSIS — I255 Ischemic cardiomyopathy: Secondary | ICD-10-CM | POA: Diagnosis not present

## 2014-08-28 DIAGNOSIS — I2510 Atherosclerotic heart disease of native coronary artery without angina pectoris: Secondary | ICD-10-CM | POA: Diagnosis not present

## 2014-08-28 DIAGNOSIS — J449 Chronic obstructive pulmonary disease, unspecified: Secondary | ICD-10-CM | POA: Diagnosis not present

## 2014-08-29 DIAGNOSIS — R339 Retention of urine, unspecified: Secondary | ICD-10-CM | POA: Diagnosis not present

## 2014-08-30 DIAGNOSIS — N189 Chronic kidney disease, unspecified: Secondary | ICD-10-CM | POA: Diagnosis not present

## 2014-08-30 DIAGNOSIS — I2510 Atherosclerotic heart disease of native coronary artery without angina pectoris: Secondary | ICD-10-CM | POA: Diagnosis not present

## 2014-08-30 DIAGNOSIS — I255 Ischemic cardiomyopathy: Secondary | ICD-10-CM | POA: Diagnosis not present

## 2014-08-30 DIAGNOSIS — J449 Chronic obstructive pulmonary disease, unspecified: Secondary | ICD-10-CM | POA: Diagnosis not present

## 2014-08-30 DIAGNOSIS — E119 Type 2 diabetes mellitus without complications: Secondary | ICD-10-CM | POA: Diagnosis not present

## 2014-08-30 DIAGNOSIS — T368X5D Adverse effect of other systemic antibiotics, subsequent encounter: Secondary | ICD-10-CM | POA: Diagnosis not present

## 2014-09-01 DIAGNOSIS — T368X5D Adverse effect of other systemic antibiotics, subsequent encounter: Secondary | ICD-10-CM | POA: Diagnosis not present

## 2014-09-01 DIAGNOSIS — J449 Chronic obstructive pulmonary disease, unspecified: Secondary | ICD-10-CM | POA: Diagnosis not present

## 2014-09-01 DIAGNOSIS — I2510 Atherosclerotic heart disease of native coronary artery without angina pectoris: Secondary | ICD-10-CM | POA: Diagnosis not present

## 2014-09-01 DIAGNOSIS — E119 Type 2 diabetes mellitus without complications: Secondary | ICD-10-CM | POA: Diagnosis not present

## 2014-09-01 DIAGNOSIS — I255 Ischemic cardiomyopathy: Secondary | ICD-10-CM | POA: Diagnosis not present

## 2014-09-01 DIAGNOSIS — N189 Chronic kidney disease, unspecified: Secondary | ICD-10-CM | POA: Diagnosis not present

## 2014-09-03 DIAGNOSIS — J449 Chronic obstructive pulmonary disease, unspecified: Secondary | ICD-10-CM | POA: Diagnosis not present

## 2014-09-03 DIAGNOSIS — N189 Chronic kidney disease, unspecified: Secondary | ICD-10-CM | POA: Diagnosis not present

## 2014-09-03 DIAGNOSIS — I255 Ischemic cardiomyopathy: Secondary | ICD-10-CM | POA: Diagnosis not present

## 2014-09-03 DIAGNOSIS — T368X5D Adverse effect of other systemic antibiotics, subsequent encounter: Secondary | ICD-10-CM | POA: Diagnosis not present

## 2014-09-03 DIAGNOSIS — I2510 Atherosclerotic heart disease of native coronary artery without angina pectoris: Secondary | ICD-10-CM | POA: Diagnosis not present

## 2014-09-03 DIAGNOSIS — E119 Type 2 diabetes mellitus without complications: Secondary | ICD-10-CM | POA: Diagnosis not present

## 2014-09-04 DIAGNOSIS — E119 Type 2 diabetes mellitus without complications: Secondary | ICD-10-CM | POA: Diagnosis not present

## 2014-09-04 DIAGNOSIS — I255 Ischemic cardiomyopathy: Secondary | ICD-10-CM | POA: Diagnosis not present

## 2014-09-04 DIAGNOSIS — I2510 Atherosclerotic heart disease of native coronary artery without angina pectoris: Secondary | ICD-10-CM | POA: Diagnosis not present

## 2014-09-04 DIAGNOSIS — T368X5D Adverse effect of other systemic antibiotics, subsequent encounter: Secondary | ICD-10-CM | POA: Diagnosis not present

## 2014-09-04 DIAGNOSIS — N189 Chronic kidney disease, unspecified: Secondary | ICD-10-CM | POA: Diagnosis not present

## 2014-09-04 DIAGNOSIS — J449 Chronic obstructive pulmonary disease, unspecified: Secondary | ICD-10-CM | POA: Diagnosis not present

## 2014-09-05 DIAGNOSIS — N320 Bladder-neck obstruction: Secondary | ICD-10-CM | POA: Diagnosis not present

## 2014-09-05 DIAGNOSIS — N2581 Secondary hyperparathyroidism of renal origin: Secondary | ICD-10-CM | POA: Diagnosis not present

## 2014-09-05 DIAGNOSIS — N189 Chronic kidney disease, unspecified: Secondary | ICD-10-CM | POA: Diagnosis not present

## 2014-09-05 DIAGNOSIS — N183 Chronic kidney disease, stage 3 (moderate): Secondary | ICD-10-CM | POA: Diagnosis not present

## 2014-09-05 DIAGNOSIS — D631 Anemia in chronic kidney disease: Secondary | ICD-10-CM | POA: Diagnosis not present

## 2014-09-06 DIAGNOSIS — I255 Ischemic cardiomyopathy: Secondary | ICD-10-CM | POA: Diagnosis not present

## 2014-09-06 DIAGNOSIS — N189 Chronic kidney disease, unspecified: Secondary | ICD-10-CM | POA: Diagnosis not present

## 2014-09-06 DIAGNOSIS — J449 Chronic obstructive pulmonary disease, unspecified: Secondary | ICD-10-CM | POA: Diagnosis not present

## 2014-09-06 DIAGNOSIS — T368X5D Adverse effect of other systemic antibiotics, subsequent encounter: Secondary | ICD-10-CM | POA: Diagnosis not present

## 2014-09-06 DIAGNOSIS — I2510 Atherosclerotic heart disease of native coronary artery without angina pectoris: Secondary | ICD-10-CM | POA: Diagnosis not present

## 2014-09-06 DIAGNOSIS — E119 Type 2 diabetes mellitus without complications: Secondary | ICD-10-CM | POA: Diagnosis not present

## 2014-09-12 DIAGNOSIS — N189 Chronic kidney disease, unspecified: Secondary | ICD-10-CM | POA: Diagnosis not present

## 2014-09-12 DIAGNOSIS — E119 Type 2 diabetes mellitus without complications: Secondary | ICD-10-CM | POA: Diagnosis not present

## 2014-09-12 DIAGNOSIS — I2510 Atherosclerotic heart disease of native coronary artery without angina pectoris: Secondary | ICD-10-CM | POA: Diagnosis not present

## 2014-09-12 DIAGNOSIS — T368X5D Adverse effect of other systemic antibiotics, subsequent encounter: Secondary | ICD-10-CM | POA: Diagnosis not present

## 2014-09-12 DIAGNOSIS — I255 Ischemic cardiomyopathy: Secondary | ICD-10-CM | POA: Diagnosis not present

## 2014-09-12 DIAGNOSIS — J449 Chronic obstructive pulmonary disease, unspecified: Secondary | ICD-10-CM | POA: Diagnosis not present

## 2014-09-13 DIAGNOSIS — N189 Chronic kidney disease, unspecified: Secondary | ICD-10-CM | POA: Diagnosis not present

## 2014-09-13 DIAGNOSIS — E119 Type 2 diabetes mellitus without complications: Secondary | ICD-10-CM | POA: Diagnosis not present

## 2014-09-13 DIAGNOSIS — I255 Ischemic cardiomyopathy: Secondary | ICD-10-CM | POA: Diagnosis not present

## 2014-09-13 DIAGNOSIS — I2510 Atherosclerotic heart disease of native coronary artery without angina pectoris: Secondary | ICD-10-CM | POA: Diagnosis not present

## 2014-09-13 DIAGNOSIS — T368X5D Adverse effect of other systemic antibiotics, subsequent encounter: Secondary | ICD-10-CM | POA: Diagnosis not present

## 2014-09-13 DIAGNOSIS — J449 Chronic obstructive pulmonary disease, unspecified: Secondary | ICD-10-CM | POA: Diagnosis not present

## 2014-09-14 DIAGNOSIS — I255 Ischemic cardiomyopathy: Secondary | ICD-10-CM | POA: Diagnosis not present

## 2014-09-14 DIAGNOSIS — E119 Type 2 diabetes mellitus without complications: Secondary | ICD-10-CM | POA: Diagnosis not present

## 2014-09-14 DIAGNOSIS — J449 Chronic obstructive pulmonary disease, unspecified: Secondary | ICD-10-CM | POA: Diagnosis not present

## 2014-09-14 DIAGNOSIS — T368X5D Adverse effect of other systemic antibiotics, subsequent encounter: Secondary | ICD-10-CM | POA: Diagnosis not present

## 2014-09-14 DIAGNOSIS — N189 Chronic kidney disease, unspecified: Secondary | ICD-10-CM | POA: Diagnosis not present

## 2014-09-14 DIAGNOSIS — I2510 Atherosclerotic heart disease of native coronary artery without angina pectoris: Secondary | ICD-10-CM | POA: Diagnosis not present

## 2014-09-19 DIAGNOSIS — R339 Retention of urine, unspecified: Secondary | ICD-10-CM | POA: Diagnosis not present

## 2014-09-21 ENCOUNTER — Encounter: Payer: Medicare Other | Admitting: Internal Medicine

## 2014-09-25 DIAGNOSIS — I2510 Atherosclerotic heart disease of native coronary artery without angina pectoris: Secondary | ICD-10-CM | POA: Diagnosis not present

## 2014-09-25 DIAGNOSIS — T368X5D Adverse effect of other systemic antibiotics, subsequent encounter: Secondary | ICD-10-CM | POA: Diagnosis not present

## 2014-09-25 DIAGNOSIS — J449 Chronic obstructive pulmonary disease, unspecified: Secondary | ICD-10-CM | POA: Diagnosis not present

## 2014-09-25 DIAGNOSIS — E119 Type 2 diabetes mellitus without complications: Secondary | ICD-10-CM | POA: Diagnosis not present

## 2014-09-25 DIAGNOSIS — I255 Ischemic cardiomyopathy: Secondary | ICD-10-CM | POA: Diagnosis not present

## 2014-09-25 DIAGNOSIS — N189 Chronic kidney disease, unspecified: Secondary | ICD-10-CM | POA: Diagnosis not present

## 2014-09-26 DIAGNOSIS — R339 Retention of urine, unspecified: Secondary | ICD-10-CM | POA: Diagnosis not present

## 2014-10-04 DIAGNOSIS — N189 Chronic kidney disease, unspecified: Secondary | ICD-10-CM | POA: Diagnosis not present

## 2014-10-09 DIAGNOSIS — R300 Dysuria: Secondary | ICD-10-CM | POA: Diagnosis not present

## 2014-10-11 ENCOUNTER — Other Ambulatory Visit (HOSPITAL_COMMUNITY): Payer: Self-pay | Admitting: Cardiology

## 2014-10-11 DIAGNOSIS — J449 Chronic obstructive pulmonary disease, unspecified: Secondary | ICD-10-CM | POA: Diagnosis not present

## 2014-10-11 DIAGNOSIS — E119 Type 2 diabetes mellitus without complications: Secondary | ICD-10-CM | POA: Diagnosis not present

## 2014-10-11 DIAGNOSIS — N189 Chronic kidney disease, unspecified: Secondary | ICD-10-CM | POA: Diagnosis not present

## 2014-10-11 DIAGNOSIS — T368X5D Adverse effect of other systemic antibiotics, subsequent encounter: Secondary | ICD-10-CM | POA: Diagnosis not present

## 2014-10-11 DIAGNOSIS — I2510 Atherosclerotic heart disease of native coronary artery without angina pectoris: Secondary | ICD-10-CM | POA: Diagnosis not present

## 2014-10-11 DIAGNOSIS — I6523 Occlusion and stenosis of bilateral carotid arteries: Secondary | ICD-10-CM

## 2014-10-11 DIAGNOSIS — I255 Ischemic cardiomyopathy: Secondary | ICD-10-CM | POA: Diagnosis not present

## 2014-10-17 ENCOUNTER — Ambulatory Visit (HOSPITAL_COMMUNITY): Payer: Medicare Other | Attending: Cardiology | Admitting: *Deleted

## 2014-10-17 ENCOUNTER — Encounter: Payer: Self-pay | Admitting: Internal Medicine

## 2014-10-17 ENCOUNTER — Ambulatory Visit (INDEPENDENT_AMBULATORY_CARE_PROVIDER_SITE_OTHER): Payer: Medicare Other | Admitting: Internal Medicine

## 2014-10-17 VITALS — BP 138/78 | HR 68 | Ht 71.0 in | Wt 190.0 lb

## 2014-10-17 DIAGNOSIS — I10 Essential (primary) hypertension: Secondary | ICD-10-CM | POA: Insufficient documentation

## 2014-10-17 DIAGNOSIS — Z87891 Personal history of nicotine dependence: Secondary | ICD-10-CM | POA: Insufficient documentation

## 2014-10-17 DIAGNOSIS — K5791 Diverticulosis of intestine, part unspecified, without perforation or abscess with bleeding: Secondary | ICD-10-CM

## 2014-10-17 DIAGNOSIS — I2510 Atherosclerotic heart disease of native coronary artery without angina pectoris: Secondary | ICD-10-CM

## 2014-10-17 DIAGNOSIS — I6523 Occlusion and stenosis of bilateral carotid arteries: Secondary | ICD-10-CM

## 2014-10-17 DIAGNOSIS — J449 Chronic obstructive pulmonary disease, unspecified: Secondary | ICD-10-CM | POA: Insufficient documentation

## 2014-10-17 DIAGNOSIS — I259 Chronic ischemic heart disease, unspecified: Secondary | ICD-10-CM

## 2014-10-17 DIAGNOSIS — N189 Chronic kidney disease, unspecified: Secondary | ICD-10-CM

## 2014-10-17 DIAGNOSIS — Z951 Presence of aortocoronary bypass graft: Secondary | ICD-10-CM | POA: Insufficient documentation

## 2014-10-17 DIAGNOSIS — E785 Hyperlipidemia, unspecified: Secondary | ICD-10-CM | POA: Diagnosis not present

## 2014-10-17 DIAGNOSIS — Z45018 Encounter for adjustment and management of other part of cardiac pacemaker: Secondary | ICD-10-CM

## 2014-10-17 DIAGNOSIS — E119 Type 2 diabetes mellitus without complications: Secondary | ICD-10-CM | POA: Diagnosis not present

## 2014-10-17 DIAGNOSIS — N179 Acute kidney failure, unspecified: Secondary | ICD-10-CM

## 2014-10-17 DIAGNOSIS — I5022 Chronic systolic (congestive) heart failure: Secondary | ICD-10-CM

## 2014-10-17 LAB — MDC_IDC_ENUM_SESS_TYPE_INCLINIC
Battery Remaining Longevity: 81.6 mo
Brady Statistic RV Percent Paced: 0.99 %
HighPow Impedance: 65.25 Ohm
Lead Channel Impedance Value: 450 Ohm
Lead Channel Sensing Intrinsic Amplitude: 12 mV
Lead Channel Setting Pacing Amplitude: 2.5 V
Lead Channel Setting Pacing Pulse Width: 0.5 ms
Lead Channel Setting Sensing Sensitivity: 0.5 mV
MDC IDC MSMT LEADCHNL RV PACING THRESHOLD AMPLITUDE: 1 V
MDC IDC MSMT LEADCHNL RV PACING THRESHOLD PULSEWIDTH: 0.5 ms
MDC IDC PG SERIAL: 1016528
MDC IDC SESS DTM: 20160217132653
MDC IDC SET ZONE DETECTION INTERVAL: 350 ms
Zone Setting Detection Interval: 300 ms

## 2014-10-17 NOTE — Progress Notes (Signed)
Carotid Duplex Exam Performed 

## 2014-10-17 NOTE — Patient Instructions (Signed)
Your physician wants you to follow-up in: 6 months with Patrick Marshall, NP and 12 months with Patrick Boyle will receive a reminder letter in the mail two months in advance. If you don't receive a letter, please call our office to schedule the follow-up appointment.  Remote monitoring is used to monitor your Pacemaker or ICD from home. This monitoring reduces the number of office visits required to check your device to one time per year. It allows [place] to keep an eye on the functioning of your device to ensure it is working properly. You are scheduled for a device check from home on 01/16/15. You may send your transmission at any time that day. If you have a wireless device, the transmission will be sent automatically. After your physician reviews your transmission, you will receive a postcard with your next transmission date.

## 2014-10-17 NOTE — Progress Notes (Signed)
Electrophysiology Office Note   Date:  10/17/2014   ID:  Patrick Boyle, DOB Jul 30, 1934, MRN [id number]  PCP:  Abigail Miyamoto, MD  Cardiologist:  Dr Irish Lack Primary Electrophysiologist: Thompson Grayer, MD    Chief Complaint  Patient presents with  . Follow-up    Chronic systolic CHF     History of Present Illness: Patrick Boyle is a 81 y.o. male who presents today for electrophysiology evaluation.   He returns today for EP follow-up.  He is doing well.  His primary issue has been with recent urinary obstruction and associated renal failure.  This is being followed closely by Urology and Nephrology.  He also has anemia and has been evaluated by GI.  He has diverticulosis.    Today, he denies symptoms of palpitations, chest pain, shortness of breath, orthopnea, PND, lower extremity edema, claudication, dizziness, presyncope, syncope, bleeding, or neurologic sequela. The patient is tolerating medications without difficulties and is otherwise without complaint today.    Past Medical History  Diagnosis Date  . Diabetes mellitus     dr Olen Pel  . Hyperlipidemia   . Hypertension   . Coronary artery disease     s/p CABG 1997  . GERD (gastroesophageal reflux disease)   . Arthritis   . Allergic rhinitis   . Hyperplastic colon polyp   . Diverticulitis, colon   . Meniere's syndrome   . Tremor, essential     dr Jannifer Franklin  . COPD, mild     dr clance  . MI (myocardial infarction) 1997, 2005  . Ischemic cardiomyopathy     sp ICD (SJM) implanted by Dr Rayann Heman   Past Surgical History  Procedure Laterality Date  . Coronary artery bypass graft  [place]  . Cardiac catheterization  2005    medical therapy  . Cardiac defibrillator placement      ICD implanted by Dr Rayann Heman, Analyze ST study patient  . Esophagogastroduodenoscopy N/A 08/25/2014    Procedure: ESOPHAGOGASTRODUODENOSCOPY (EGD);  Surgeon: Lear Ng, MD;  Location: [facility];  Service: Endoscopy;  Laterality: N/A;    . Colonoscopy N/A 08/25/2014    Procedure: COLONOSCOPY;  Surgeon: Lear Ng, MD;  Location: [facility];  Service: Endoscopy;  Laterality: N/A;     Current Outpatient Prescriptions  Medication Sig Dispense Refill  . Acetaminophen (ARTHRITIS PAIN RELIEF PO) Take 650 mg by mouth 2 (two) times daily.     Marland Kitchen aspirin 325 MG tablet Take 325 mg by mouth daily.      . bimatoprost (LUMIGAN) 0.03 % ophthalmic solution Place 1 drop into both eyes at bedtime.     . calcitRIOL (ROCALTROL) 0.25 MCG capsule Take 0.25 mcg by mouth daily.    . cetirizine (ZYRTEC) 10 MG tablet Take 5 mg by mouth daily.      . cholecalciferol (VITAMIN D) 1000 UNITS tablet Take 1,000 Units by mouth daily.    Marland Kitchen donepezil (ARICEPT) 10 MG tablet Take 10 mg by mouth at bedtime.    . fish oil-omega-3 fatty acids 1000 MG capsule Take 2 g by mouth 2 (two) times daily.     . Fluticasone Propionate, Inhal, (FLOVENT DISKUS) 100 MCG/BLIST AEPB Inhale 1 puff into the lungs daily.     Marland Kitchen gabapentin (NEURONTIN) 300 MG capsule Take 300 mg by mouth 3 (three) times daily.    Marland Kitchen glimepiride (AMARYL) 4 MG tablet Take 4 mg by mouth 2 (two) times daily.      . lansoprazole (PREVACID) 30  MG capsule Take 30 mg by mouth daily as needed (for acid reflex).     Marland Kitchen levothyroxine (SYNTHROID, LEVOTHROID) 50 MCG tablet Take 50 mcg by mouth daily before breakfast.    . MECLIZINE HCL PO Take 5 mg by mouth daily as needed (vertigo).    . psyllium (REGULOID) 0.52 G capsule Take 0.52 g by mouth daily.    . simvastatin (ZOCOR) 40 MG tablet Take 40 mg by mouth at bedtime.      . sitaGLIPtin (JANUVIA) 50 MG tablet Take 50 mg by mouth daily with breakfast.    . sodium bicarbonate 650 MG tablet Take 1 tablet (650 mg total) by mouth 2 (two) times daily. (Patient taking differently: Take 650 mg by mouth 3 (three) times daily. ) 60 tablet 1  . tamsulosin (FLOMAX) 0.4 MG CAPS capsule Take 1 capsule (0.4 mg total) by mouth daily after supper. 30 capsule 1  .  traMADol (ULTRAM) 50 MG tablet Take 1 tablet by mouth every 6 (six) hours as needed (pain). For 10 days  1   No current facility-administered medications for this visit.    Allergies:   Metformin and related and Ramipril   Social History:  The patient  reports that he quit smoking about 19 years ago. His smoking use included Cigarettes. He quit after 35 years of use. He does not have any smokeless tobacco history on file. He reports that he drinks alcohol. He reports that he does not use illicit drugs.   Family History:  The patient's family history includes Diabetes in his mother; Parkinsonism in his father; Stomach cancer in his mother.    ROS:  Please see the history of present illness.   All other systems are reviewed and negative.    PHYSICAL EXAM: VS:  BP 138/78 mmHg  Pulse 68  Ht 5\' 11"  (1.803 m)  Wt 190 lb (86.183 kg)  BMI 26.51 kg/m2 , BMI Body mass index is 26.51 kg/(m^2). GEN: Well nourished, well developed, in no acute distress HEENT: normal Neck: no JVD, carotid bruits, or masses Cardiac: RRR; no murmurs, rubs, or gallops,no edema  Respiratory:  clear to auscultation bilaterally, normal work of breathing GI: soft, nontender, nondistended, + BS MS: no deformity or atrophy Skin: warm and dry, device pocket is well healed Neuro:  Strength and sensation are intact Psych: euthymic mood, full affect  EKG:  EKG is ordered today. The ekg ordered today shows sinus rhythm PR 236, nonspecific ST/T changes  Device interrogation is reviewed today in detail.  See PaceArt for details.   Recent Labs: 08/20/2014: ALT 21 08/21/2014: Magnesium 1.5; TSH 1.877 08/27/2014: BUN 36*; Creatinine 2.43*; Hemoglobin 8.4*; Platelets 133*; Potassium 3.8; Sodium 137    Lipid Panel  No results found for: CHOL, TRIG, HDL, CHOLHDL, VLDL, LDLCALC, LDLDIRECT   Wt Readings from Last 3 Encounters:  10/17/14 190 lb (86.183 kg)  08/27/14 192 lb 0.3 oz (87.1 kg)  08/08/14 195 lb 5.2 oz (88.6  kg)      Other studies Reviewed: Additional studies/ records that were reviewed today include: recent hospital records, labs, and Dr Hassell Done last note   ASSESSMENT AND PLAN:  1.  Chronic systolic dysfunction euvolemic Normal ICD function See Pace Art report No changes today Not a candidate for ACEi or ARB given recent renal failure  2. CAD No ischemic symptoms Given recent anemia/ bleeding, I will reduce ASA to 81mg  daily today  3. Anemia/ renal failure Discussed at length with patient and daughter today  4. HTN Stable No change required today    Current medicines are reviewed at length with the patient today.   The patient does not have concerns regarding his medicines.  The following changes were made today:  none   Follow-up: Merlin Return to see Chanetta Marshall NP in 6 months, I will see in a year Follow-up with Dr Irish Lack as scheduled  Signed, Thompson Grayer, MD  10/17/2014 11:37 AM     [address] [place] [address] [phone] (office) [phone] (fax)

## 2014-10-18 DIAGNOSIS — K219 Gastro-esophageal reflux disease without esophagitis: Secondary | ICD-10-CM | POA: Diagnosis not present

## 2014-10-18 DIAGNOSIS — E1142 Type 2 diabetes mellitus with diabetic polyneuropathy: Secondary | ICD-10-CM | POA: Diagnosis not present

## 2014-10-18 DIAGNOSIS — R197 Diarrhea, unspecified: Secondary | ICD-10-CM | POA: Diagnosis not present

## 2014-10-22 DIAGNOSIS — R339 Retention of urine, unspecified: Secondary | ICD-10-CM | POA: Diagnosis not present

## 2014-10-22 DIAGNOSIS — N401 Enlarged prostate with lower urinary tract symptoms: Secondary | ICD-10-CM | POA: Diagnosis not present

## 2014-10-25 DIAGNOSIS — H4011X3 Primary open-angle glaucoma, severe stage: Secondary | ICD-10-CM | POA: Diagnosis not present

## 2014-10-25 DIAGNOSIS — H02052 Trichiasis without entropian right lower eyelid: Secondary | ICD-10-CM | POA: Diagnosis not present

## 2014-10-31 DIAGNOSIS — D631 Anemia in chronic kidney disease: Secondary | ICD-10-CM | POA: Diagnosis not present

## 2014-10-31 DIAGNOSIS — N320 Bladder-neck obstruction: Secondary | ICD-10-CM | POA: Diagnosis not present

## 2014-10-31 DIAGNOSIS — N2581 Secondary hyperparathyroidism of renal origin: Secondary | ICD-10-CM | POA: Diagnosis not present

## 2014-10-31 DIAGNOSIS — N189 Chronic kidney disease, unspecified: Secondary | ICD-10-CM | POA: Diagnosis not present

## 2014-11-12 DIAGNOSIS — H6123 Impacted cerumen, bilateral: Secondary | ICD-10-CM | POA: Diagnosis not present

## 2014-11-16 DIAGNOSIS — H4011X2 Primary open-angle glaucoma, moderate stage: Secondary | ICD-10-CM | POA: Diagnosis not present

## 2014-11-26 ENCOUNTER — Encounter: Payer: Medicare Other | Admitting: Internal Medicine

## 2014-12-11 DIAGNOSIS — Z08 Encounter for follow-up examination after completed treatment for malignant neoplasm: Secondary | ICD-10-CM | POA: Diagnosis not present

## 2014-12-11 DIAGNOSIS — L570 Actinic keratosis: Secondary | ICD-10-CM | POA: Diagnosis not present

## 2014-12-11 DIAGNOSIS — L820 Inflamed seborrheic keratosis: Secondary | ICD-10-CM | POA: Diagnosis not present

## 2014-12-11 DIAGNOSIS — Z85828 Personal history of other malignant neoplasm of skin: Secondary | ICD-10-CM | POA: Diagnosis not present

## 2014-12-13 DIAGNOSIS — R112 Nausea with vomiting, unspecified: Secondary | ICD-10-CM | POA: Diagnosis not present

## 2014-12-14 DIAGNOSIS — R339 Retention of urine, unspecified: Secondary | ICD-10-CM | POA: Diagnosis not present

## 2014-12-14 DIAGNOSIS — N401 Enlarged prostate with lower urinary tract symptoms: Secondary | ICD-10-CM | POA: Diagnosis not present

## 2014-12-14 DIAGNOSIS — N138 Other obstructive and reflux uropathy: Secondary | ICD-10-CM | POA: Diagnosis not present

## 2014-12-24 ENCOUNTER — Telehealth: Payer: Self-pay | Admitting: Interventional Cardiology

## 2014-12-24 NOTE — Telephone Encounter (Signed)
Based on his 2011 echo and the latest recommendations or SBE prophylaxis, he does not require antibiotics before going to the dentist, unless he has had a heart valve infection (endocaditis) in the past.  One option would be to not given anything.  Would check with phar D as to next ABx option if it is required.  I think it would be clindamycin.

## 2014-12-24 NOTE — Telephone Encounter (Signed)
Spoke with wife and she states that pt took Amoxicillin 2g one hour prior to his dental cleaning and started throwing up, had chills and sweats. Wife states that this happened back in December when pt was prescribed this medication for a dental cleaning and happened again earlier this year when pt had a UTI and was given Amoxicillin. Wife states that the dentist recommend that she contact another physician on recommendations for what to take since their protocol is typically the Amoxicillin. Informed with that I would route this information over to Dr. Irish Lack for review and would call her back if he had any recommendations. Wife verbalized understanding.

## 2014-12-24 NOTE — Telephone Encounter (Signed)
Pt c/o medication issue:  1. Name of Medication: Amoxacillin  2. How are you currently taking this medication (dosage and times per day)? 500 mg, 4 tabs, 1 hr prior to any dental cleaning   3. Are you having a reaction (difficulty breathing--STAT)? Throwing up, chills, sweats. This has happened the last two times he has taken this.  4. What is your medication issue? Pt's wife calling to find out if Dr. Irish Lack can recommend a different antibiotic for pt to take. Please call back and advise.         Amoxacillin 500mg 

## 2014-12-24 NOTE — Telephone Encounter (Signed)
Attempted to call back, line was busy.

## 2014-12-25 NOTE — Telephone Encounter (Signed)
Spoke with Erasmo Downer, PharmD and she states that SBE prophylaxis is not needed for this pt. Spoke with pt's wife with pt's permission and informed her that from a cardiac standpoint the pt did not need ABTs prior to dental cleaning. Wife states that he needs the ABT due to a hip replacement he has had. Informed wife to contact the office that did the hip surgery for further advisement on which ABT they would suggest. Wife verbalized understanding and was in agreement with this plan.

## 2014-12-31 DIAGNOSIS — E1142 Type 2 diabetes mellitus with diabetic polyneuropathy: Secondary | ICD-10-CM | POA: Diagnosis not present

## 2014-12-31 DIAGNOSIS — E039 Hypothyroidism, unspecified: Secondary | ICD-10-CM | POA: Diagnosis not present

## 2014-12-31 DIAGNOSIS — G3184 Mild cognitive impairment, so stated: Secondary | ICD-10-CM | POA: Diagnosis not present

## 2014-12-31 DIAGNOSIS — I429 Cardiomyopathy, unspecified: Secondary | ICD-10-CM | POA: Diagnosis not present

## 2014-12-31 DIAGNOSIS — R300 Dysuria: Secondary | ICD-10-CM | POA: Diagnosis not present

## 2015-01-10 DIAGNOSIS — E119 Type 2 diabetes mellitus without complications: Secondary | ICD-10-CM | POA: Diagnosis not present

## 2015-01-10 DIAGNOSIS — N390 Urinary tract infection, site not specified: Secondary | ICD-10-CM | POA: Diagnosis not present

## 2015-01-10 DIAGNOSIS — N189 Chronic kidney disease, unspecified: Secondary | ICD-10-CM | POA: Diagnosis not present

## 2015-01-10 DIAGNOSIS — D631 Anemia in chronic kidney disease: Secondary | ICD-10-CM | POA: Diagnosis not present

## 2015-01-10 DIAGNOSIS — N2581 Secondary hyperparathyroidism of renal origin: Secondary | ICD-10-CM | POA: Diagnosis not present

## 2015-01-16 ENCOUNTER — Encounter: Payer: Self-pay | Admitting: Internal Medicine

## 2015-01-16 ENCOUNTER — Ambulatory Visit (INDEPENDENT_AMBULATORY_CARE_PROVIDER_SITE_OTHER): Payer: Medicare Other | Admitting: *Deleted

## 2015-01-16 DIAGNOSIS — I259 Chronic ischemic heart disease, unspecified: Secondary | ICD-10-CM | POA: Diagnosis not present

## 2015-01-16 NOTE — Progress Notes (Signed)
Remote ICD transmission.   

## 2015-01-23 LAB — CUP PACEART REMOTE DEVICE CHECK
Battery Remaining Longevity: 6.5
Date Time Interrogation Session: 20160525170903
HighPow Impedance: 63 Ohm
Lead Channel Impedance Value: 400 Ohm
Lead Channel Setting Pacing Pulse Width: 0.5 ms
MDC IDC MSMT LEADCHNL RV SENSING INTR AMPL: 12 mV
MDC IDC PG SERIAL: 1016528
MDC IDC SET LEADCHNL RV PACING AMPLITUDE: 2.5 V
MDC IDC SET LEADCHNL RV SENSING SENSITIVITY: 0.5 mV
MDC IDC STAT BRADY RV PERCENT PACED: 1 % — AB
Zone Setting Detection Interval: 300 ms
Zone Setting Detection Interval: 350 ms

## 2015-02-06 ENCOUNTER — Encounter: Payer: Self-pay | Admitting: Cardiology

## 2015-02-14 ENCOUNTER — Encounter: Payer: Self-pay | Admitting: Podiatry

## 2015-02-14 ENCOUNTER — Ambulatory Visit (INDEPENDENT_AMBULATORY_CARE_PROVIDER_SITE_OTHER): Payer: Medicare Other | Admitting: Podiatry

## 2015-02-14 VITALS — BP 118/99 | HR 66 | Resp 18

## 2015-02-14 DIAGNOSIS — M79674 Pain in right toe(s): Secondary | ICD-10-CM | POA: Diagnosis not present

## 2015-02-14 DIAGNOSIS — M79609 Pain in unspecified limb: Secondary | ICD-10-CM

## 2015-02-14 DIAGNOSIS — B351 Tinea unguium: Secondary | ICD-10-CM | POA: Diagnosis not present

## 2015-02-14 DIAGNOSIS — I259 Chronic ischemic heart disease, unspecified: Secondary | ICD-10-CM

## 2015-02-14 NOTE — Progress Notes (Signed)
   Subjective:    Patient ID: Patrick Boyle, male    DOB: [birth year], 81 y.o.   MRN: [id number]  HPI my right big toenail was red and throbs and i have not soaked it and hurts with shoes some and has been going on for quite some time He presents to the office with pain on the inside border big toe right foot.  He says the nail grows into the inside border right foot with minimal redness noted.  Pain occurs walking and wearing shoes.   Review of Systems  HENT: Positive for hearing loss and sinus pressure.   All other systems reviewed and are negative.      Objective:   Physical Exam Objective: Review of past medical history, medications, social history and allergies were performed.  Vascular: Dorsalis pedis and posterior tibial pulses were palpable B/L, capillary refill was  WNL B/L, temperature gradient was WNL B/L   Skin:  No signs of symptoms of infection or ulcers on both feet  Nails: Presence of thick painful incurvated pincer toenails big toes especially right foot.    Sensory: Thornell Mule monifilament WNL   Orthopedic: Orthopedic evaluation demonstrates all joints distal t ankle have full ROM without crepitus, muscle power WNL B/L        Assessment & Plan:  Onchomycosis Hallux B/L  Treatment.   IE  Debridement and grinding of long painful incurvated nails.

## 2015-02-19 ENCOUNTER — Encounter: Payer: Self-pay | Admitting: *Deleted

## 2015-02-19 ENCOUNTER — Telehealth: Payer: Self-pay | Admitting: *Deleted

## 2015-02-19 NOTE — Patient Outreach (Signed)
[name] Care Management  02/19/2015  Patrick Boyle 05/03/1934 [id number]   Referral from [place], Deloria Lair, NP to outreach.  Patrick Boyle. [place], [place] Management [place] Assistant Phone: [phone] Fax: [phone]

## 2015-02-19 NOTE — Patient Outreach (Signed)
I spoke to Mrs. Pundt today regarding [place] [facility] services for her husband. Mr. Daywalt gave permission for her to speak with me on his behalf. She feels like this would be beneficial and I have made an appt for an initial home visit for Monday, June 27 at 2:00 pm.  Deloria Lair [facility] [place] [phone]

## 2015-02-22 ENCOUNTER — Encounter: Payer: Self-pay | Admitting: Interventional Cardiology

## 2015-02-22 ENCOUNTER — Ambulatory Visit (INDEPENDENT_AMBULATORY_CARE_PROVIDER_SITE_OTHER): Payer: Medicare Other | Admitting: Interventional Cardiology

## 2015-02-22 VITALS — BP 122/62 | HR 73 | Ht 71.0 in | Wt 198.8 lb

## 2015-02-22 DIAGNOSIS — R339 Retention of urine, unspecified: Secondary | ICD-10-CM

## 2015-02-22 DIAGNOSIS — N184 Chronic kidney disease, stage 4 (severe): Secondary | ICD-10-CM | POA: Diagnosis not present

## 2015-02-22 DIAGNOSIS — I5022 Chronic systolic (congestive) heart failure: Secondary | ICD-10-CM | POA: Diagnosis not present

## 2015-02-22 DIAGNOSIS — I259 Chronic ischemic heart disease, unspecified: Secondary | ICD-10-CM | POA: Diagnosis not present

## 2015-02-22 DIAGNOSIS — I10 Essential (primary) hypertension: Secondary | ICD-10-CM

## 2015-02-22 NOTE — Patient Instructions (Signed)
Medication Instructions:  Same-no change  Labwork: None  Testing/Procedures: None  Follow-Up: Your physician wants you to follow-up in: 1 year. You will receive a reminder letter in the mail two months in advance. If you don't receive a letter, please call our office to schedule the follow-up appointment.    Any Other Special Instructions Will Be Listed Below (If Applicable). Your physician discussed the importance of regular exercise and recommended that you start or continue a regular exercise program for good health.

## 2015-02-22 NOTE — Patient Outreach (Signed)
I spoke to Patrick Boyle today, Patrick Boyle gave permission. I explained [place] and Patrick Boyle believes this will be appropriate for Patrick Boyle who is a complex pt with multiple chronic illnesses. Patrick Boyle answering many questions for me today and we have scheduled an in home visit for Monday, June 27 th at 2:00 pm.  Patrick Boyle [facility] [place] [phone]

## 2015-02-22 NOTE — Progress Notes (Signed)
Patient ID: Patrick Boyle, male   DOB: [birth year], 81 y.o.   MRN: [id number]     Cardiology Office Note   Date:  02/22/2015   ID:  Patrick Boyle, DOB August 26, 1934, MRN [id number]  PCP:  Abigail Miyamoto, MD    No chief complaint on file.    Wt Readings from Last 3 Encounters:  02/22/15 198 lb 12.8 oz (90.175 kg)  10/17/14 190 lb (86.183 kg)  08/27/14 192 lb 0.3 oz (87.1 kg)       History of Present Illness: Patrick Boyle is a 81 y.o. male  who wa sfound to have an irregular heartbeat on a physical by PMD in early 2014. ECG revealed AFib. He had not noticed any problems. He cannot exercise too much due to a left leg amputation. He does work in the yard and gets [employer].  Between 2013 and 2014, he has several syncopal spells.  THis resolved with stopping BP meds.  No frequent high BP readings.   Since the last visit, he has been hospitalized several times. He had urinary obstruction. He now has to self catheterize. He developed some advanced renal insufficiency with creatinines in the 2.5 range.  He has not walking much. He does have an exercise bike at home. His fasting blood sugars are also elevated. He is having difficulty controlling these. Several medications would be useful but he cannot take them due to his renal insufficiency.    Past Medical History  Diagnosis Date  . Diabetes mellitus     dr Olen Pel  . Hyperlipidemia   . Hypertension   . Coronary artery disease     s/p CABG 1997  . GERD (gastroesophageal reflux disease)   . Arthritis   . Allergic rhinitis   . Hyperplastic colon polyp   . Diverticulitis, colon   . Meniere's syndrome   . Tremor, essential     dr Jannifer Franklin  . COPD, mild     dr clance  . MI (myocardial infarction) 1997, 2005  . Ischemic cardiomyopathy     sp ICD (SJM) implanted by Dr Rayann Heman    Past Surgical History  Procedure Laterality Date  . Coronary artery bypass graft  [place]  . Cardiac catheterization  2005    medical therapy  . Cardiac  defibrillator placement      ICD implanted by Dr Rayann Heman, Analyze ST study patient  . Esophagogastroduodenoscopy N/A 08/25/2014    Procedure: ESOPHAGOGASTRODUODENOSCOPY (EGD);  Surgeon: Lear Ng, MD;  Location: [facility];  Service: Endoscopy;  Laterality: N/A;  . Colonoscopy N/A 08/25/2014    Procedure: COLONOSCOPY;  Surgeon: Lear Ng, MD;  Location: [facility] ENDOSCOPY;  Service: Endoscopy;  Laterality: N/A;     Current Outpatient Prescriptions  Medication Sig Dispense Refill  . Acetaminophen (ARTHRITIS PAIN RELIEF PO) Take 650 mg by mouth 2 (two) times daily.     Marland Kitchen aspirin EC 81 MG tablet Take 81 mg by mouth daily.    . bimatoprost (LUMIGAN) 0.03 % ophthalmic solution Place 1 drop into both eyes at bedtime.     . calcitRIOL (ROCALTROL) 0.25 MCG capsule Take 0.25 mcg by mouth daily.    . cetirizine (ZYRTEC) 10 MG tablet Take 5 mg by mouth daily.      . cholecalciferol (VITAMIN D) 1000 UNITS tablet Take 1,000 Units by mouth daily.    Marland Kitchen donepezil (ARICEPT) 10 MG tablet Take 10 mg by mouth at bedtime.    . fish oil-omega-3 fatty acids 1000  MG capsule Take 2 g by mouth 2 (two) times daily.     . furosemide (LASIX) 20 MG tablet TAKE 1 TABLET EVERY OTHER DAY ORALLY FOR 30  0  . gabapentin (NEURONTIN) 300 MG capsule Take 300 mg by mouth 3 (three) times daily.    Marland Kitchen glimepiride (AMARYL) 4 MG tablet Take 4 mg by mouth 2 (two) times daily.      . lansoprazole (PREVACID) 30 MG capsule Take 30 mg by mouth daily as needed (for acid reflex).     Marland Kitchen levothyroxine (SYNTHROID, LEVOTHROID) 50 MCG tablet Take 50 mcg by mouth daily before breakfast.    . MECLIZINE HCL PO Take 5 mg by mouth daily as needed (vertigo).    . ONE TOUCH ULTRA TEST test strip TEST 1 TIME DAILY.  3  . psyllium (REGULOID) 0.52 G capsule Take 0.52 g by mouth daily.    . simvastatin (ZOCOR) 40 MG tablet Take 40 mg by mouth at bedtime.      . sitaGLIPtin (JANUVIA) 50 MG tablet Take 50 mg by mouth daily with breakfast.      . sodium bicarbonate 650 MG tablet Take 1 tablet (650 mg total) by mouth 2 (two) times daily. (Patient taking differently: Take 650 mg by mouth 3 (three) times daily. ) 60 tablet 1  . tamsulosin (FLOMAX) 0.4 MG CAPS capsule Take 1 capsule (0.4 mg total) by mouth daily after supper. 30 capsule 1  . traMADol (ULTRAM) 50 MG tablet Take 1 tablet by mouth every 6 (six) hours as needed (pain). For 10 days  1  . ZOSTAVAX 42595 UNT/0.65ML injection   0   No current facility-administered medications for this visit.    Allergies:   Metformin and related and Ramipril    Social History:  The patient  reports that he quit smoking about 19 years ago. His smoking use included Cigarettes. He quit after 35 years of use. He does not have any smokeless tobacco history on file. He reports that he drinks alcohol. He reports that he does not use illicit drugs.   Family History:  The patient's family history includes Diabetes in his mother; Parkinsonism in his father; Stomach cancer in his mother.    ROS:  Please see the history of present illness.   Otherwise, review of systems are positive for foot pain due to neuropathy.   All other systems are reviewed and negative.    PHYSICAL EXAM: VS:  BP 122/62 mmHg  Pulse 73  Ht 5\' 11"  (1.803 m)  Wt 198 lb 12.8 oz (90.175 kg)  BMI 27.74 kg/m2  SpO2 94% , BMI Body mass index is 27.74 kg/(m^2). GEN: Well nourished, well developed, in no acute distress HEENT: normal Neck: no JVD, carotid bruits, or masses Cardiac: *RRR; no murmurs, rubs, or gallops,no edema  Respiratory:  clear to auscultation bilaterally, normal work of breathing GI: soft, nontender, nondistended, + BS MS: no deformity or atrophy Skin: warm and dry, no rash Neuro:  Strength and sensation are intact Psych: euthymic mood, full affect    Recent Labs: 08/20/2014: ALT 21 08/21/2014: Magnesium 1.5; TSH 1.877 08/27/2014: BUN 36*; Creatinine, Ser 2.43*; Hemoglobin 8.4*; Platelets 133*; Potassium  3.8; Sodium 137   Lipid Panel No results found for: CHOL, TRIG, HDL, CHOLHDL, VLDL, LDLCALC, LDLDIRECT   Other studies Reviewed: Additional studies/ records that were reviewed today with results demonstrating: hospital records.   ASSESSMENT AND PLAN:  1. CAD: Continue aspirin 81 mg daily. No angina.  H/o MI  with ischemic cardiomyopathy.  2. HTN: Controlled. Continue to hold losartan. Stopped Toprol. Let us know if he develops elevated blood pressure readings. For now, he has had some lower readings. Less orthostatic symptoms. Overall, improved off of metoprolol. No further syncope 3. Hyperlipidemia: Continue simvastatin.  4. Ischemic cardiomyopathy: s/p AICD.  Euvolemic.  Has been taking furosemide daily.   5. Urinary retention.  He has to self cath twice a day.  6. DM:  Fasting BS . 200.  Meds limited by renal function. Stresed importance of trying to exercise to help reduce blood sugar.    Current medicines are reviewed at length with the patient today.  The patient concerns regarding his medicines were addressed.  The following changes have been made:  No change  Labs/ tests ordered today include: obtain from Dr. Maceo Pro. No orders of the defined types were placed in this encounter.    Recommend 150 minutes/week of aerobic exercise Low fat, low carb, high fiber diet recommended  Disposition:   FU in 1 year   Teresita Madura., MD  02/22/2015 2:05 [facility] [address], [place], [place]  [address] Phone: [phone]; Fax: [phone]

## 2015-02-25 ENCOUNTER — Other Ambulatory Visit: Payer: Self-pay | Admitting: *Deleted

## 2015-02-25 ENCOUNTER — Encounter: Payer: Self-pay | Admitting: *Deleted

## 2015-02-25 NOTE — Patient Outreach (Unsigned)
[facility]) Care Management  [place]  02/25/2015   Patrick Boyle May 20, 1934 [id number]  Subjective: New pt referred by Dr. Maceo Pro, he is complex including CAD, Diabetes with neuropathy,   Objective: BP 110/50 mmHg  Pulse 72  Resp 16  Ht 1.803 m (5\' 11" )  Wt 192 lb (87.091 kg)  BMI 26.79 kg/m2  SpO2 97%   Current Medications:  Current Outpatient Prescriptions  Medication Sig Dispense Refill  . Acetaminophen (ARTHRITIS PAIN RELIEF PO) Take 650 mg by mouth 2 (two) times daily.     Marland Kitchen aspirin EC 81 MG tablet Take 81 mg by mouth daily.    . bimatoprost (LUMIGAN) 0.03 % ophthalmic solution Place 1 drop into both eyes at bedtime.     . calcitRIOL (ROCALTROL) 0.25 MCG capsule Take 0.25 mcg by mouth daily.    . calcium-vitamin D (OSCAL WITH D) 500-200 MG-UNIT per tablet Take 1 tablet by mouth every morning.    . cetirizine (ZYRTEC) 10 MG tablet Take 5 mg by mouth daily.      . cholecalciferol (VITAMIN D) 1000 UNITS tablet Take 1,000 Units by mouth daily.    Marland Kitchen donepezil (ARICEPT) 10 MG tablet Take 10 mg by mouth at bedtime.    . fish oil-omega-3 fatty acids 1000 MG capsule Take 2 g by mouth 2 (two) times daily.     . furosemide (LASIX) 20 MG tablet TAKE 1 TABLET EVERY OTHER DAY ORALLY FOR 30  0  . gabapentin (NEURONTIN) 300 MG capsule Take 300 mg by mouth 3 (three) times daily.    Marland Kitchen glimepiride (AMARYL) 4 MG tablet Take 4 mg by mouth 2 (two) times daily.      . lansoprazole (PREVACID) 30 MG capsule Take 30 mg by mouth daily as needed (for acid reflex).     Marland Kitchen levothyroxine (SYNTHROID, LEVOTHROID) 50 MCG tablet Take 50 mcg by mouth daily before breakfast.    . MECLIZINE HCL PO Take 5 mg by mouth daily as needed (vertigo).    . ONE TOUCH ULTRA TEST test strip TEST 1 TIME DAILY.  3  . simvastatin (ZOCOR) 40 MG tablet Take 40 mg by mouth at bedtime.      . sitaGLIPtin (JANUVIA) 50 MG tablet Take 50 mg by mouth daily with breakfast.    . sodium bicarbonate 650 MG  tablet Take 1 tablet (650 mg total) by mouth 2 (two) times daily. 60 tablet 1  . tamsulosin (FLOMAX) 0.4 MG CAPS capsule Take 1 capsule (0.4 mg total) by mouth daily after supper. 30 capsule 1  . traMADol (ULTRAM) 50 MG tablet Take 1 tablet by mouth every 6 (six) hours as needed (pain). For 10 days  1   No current facility-administered medications for this visit.           Functional Status:  In your present state of health, do you have any difficulty performing the following activities: 02/19/2015 08/21/2014  Hearing? Y N  Vision? N N  Difficulty concentrating or making decisions? Y N  Walking or climbing stairs? Y Y  Dressing or bathing? N N  Doing errands, shopping? N N  Preparing Food and eating ? N -  Using the Toilet? N -  In the past six months, have you accidently leaked urine? N -  Do you have problems with loss of bowel control? N -  Managing your Medications? N -  Managing your Finances? Y -  Housekeeping or managing your Housekeeping? Y -  Fall/Depression Screening: PHQ 2/9 Scores 02/19/2015  PHQ - 2 Score 1    Assessment: Diabetes with neuropathy  Plan: all Dr. Lorrene Reid about increasing pt's gabapentin to 600 mg bid.

## 2015-02-28 NOTE — Patient Outreach (Signed)
[facility]) Care Management  [place]  02/28/2015   Patrick Boyle Jan 23, 1934 [id number]  Subjective: Pt and wife participating in today's evaluation. Pt main problem is terrible diabetic neuropathy. He has been taking gabapentin 300 mg tid and Dr. Maceo Boyle has given an Rx to increase this to 600 mg bid. Pt has not started this as he wanted to discuss with Dr. Lorrene Boyle. His glucose levels have been running in the 200's. He has been unable to tolerate metformin and his kidney function prohibits use of some oral agents. Pt sees a nephrologist and has an appt on 03/08/15.   Objective: BP 110/50 mmHg  Pulse 72  Resp 16  Ht 1.803 m (5\' 11" )  Wt 192 lb (87.091 kg)  BMI 26.79 kg/m2  SpO2 97%   RRR Lungs clear No edema Feet without any open lesions, 1+ pedal pulses, poor sensation appreciation about 30% only. Skin is warm, with good capillary refill.   Current Medications:  Current Outpatient Prescriptions  Medication Sig Dispense Refill  . Acetaminophen (ARTHRITIS PAIN RELIEF PO) Take 650 mg by mouth 2 (two) times daily.     Marland Kitchen aspirin EC 81 MG tablet Take 81 mg by mouth daily.    . bimatoprost (LUMIGAN) 0.03 % ophthalmic solution Place 1 drop into both eyes at bedtime.     . calcitRIOL (ROCALTROL) 0.25 MCG capsule Take 0.25 mcg by mouth daily.    . calcium-vitamin D (OSCAL WITH D) 500-200 MG-UNIT per tablet Take 1 tablet by mouth every morning.    . cetirizine (ZYRTEC) 10 MG tablet Take 5 mg by mouth daily.      . cholecalciferol (VITAMIN D) 1000 UNITS tablet Take 1,000 Units by mouth daily.    Marland Kitchen donepezil (ARICEPT) 10 MG tablet Take 10 mg by mouth at bedtime.    . fish oil-omega-3 fatty acids 1000 MG capsule Take 2 g by mouth 2 (two) times daily.     . furosemide (LASIX) 20 MG tablet TAKE 1 TABLET EVERY OTHER DAY ORALLY FOR 30  0  . gabapentin (NEURONTIN) 300 MG capsule Take 300 mg by mouth 3 (three) times daily.    Marland Kitchen glimepiride (AMARYL) 4 MG tablet Take 4 mg by  mouth 2 (two) times daily.      . lansoprazole (PREVACID) 30 MG capsule Take 30 mg by mouth daily as needed (for acid reflex).     Marland Kitchen levothyroxine (SYNTHROID, LEVOTHROID) 50 MCG tablet Take 50 mcg by mouth daily before breakfast.    . MECLIZINE HCL PO Take 5 mg by mouth daily as needed (vertigo).    . ONE TOUCH ULTRA TEST test strip TEST 1 TIME DAILY.  3  . simvastatin (ZOCOR) 40 MG tablet Take 40 mg by mouth at bedtime.      . sitaGLIPtin (JANUVIA) 50 MG tablet Take 50 mg by mouth daily with breakfast.    . sodium bicarbonate 650 MG tablet Take 1 tablet (650 mg total) by mouth 2 (two) times daily. 60 tablet 1  . tamsulosin (FLOMAX) 0.4 MG CAPS capsule Take 1 capsule (0.4 mg total) by mouth daily after supper. 30 capsule 1  . traMADol (ULTRAM) 50 MG tablet Take 1 tablet by mouth every 6 (six) hours as needed (pain). For 10 days  1   No current facility-administered medications for this visit.    Functional Status:  In your present state of health, do you have any difficulty performing the following activities: 02/19/2015 08/21/2014  Hearing? Patrick Boyle  Vision? N N  Difficulty concentrating or making decisions? Y N  Walking or climbing stairs? Y Y  Dressing or bathing? N N  Doing errands, shopping? N N  Preparing Food and eating ? N -  Using the Toilet? N -  In the past six months, have you accidently leaked urine? N -  Do you have problems with loss of bowel control? N -  Managing your Medications? N -  Managing your Finances? Y -  Housekeeping or managing your Housekeeping? Y -    Fall/Depression Screening: PHQ 2/9 Scores 02/19/2015  PHQ - 2 Score 1    Assessment: Uncontrolled diabetes with neuropathy  Plan: Will request referral to Dr. Buddy Boyle, endocrinology, to assess need to start long acting            Insulin.  I did call Dr. Buelah Boyle to ask about the gabapentin increase and she said he can go ahead and try the increase but to decrease it if he has any side effects like, jerking  movements, dropping items. She is not sure his kidney function can tolerate the increase.  I will see the pt again in 2 weeks.   [name] Problem One        Patient Outreach from 02/25/2015 in [place] Problem One  Pt glucose levels are elevated.   Care Plan for Problem One  Active   THN Long Term Goal (31-90 days)  Pt Hgb A1C will be reduced to at or below 7.0 over the next 90 days.   THN Long Term Goal Start Date  02/25/15   Interventions for Problem One Long Term Goal  Discussed diet. Discussed need for consultation with endocrinology and perhaps starting long acting insulin for better control. Will request referral from Dr. Maceo Boyle.   THN CM Short Term Goal #1 (0-30 days)  Pt will review [facility] programs on diabetes control over the next 30 days.   THN CM Short Term Goal #1 Start Date  02/25/15   Interventions for Short Term Goal #1  Assigned [facility] programs on diabetes control.     [name] Problem Two        Patient Outreach from 02/25/2015 in [place] Problem Two  Pt has uncontrolled neuropathic pain.   Care Plan for Problem Two  Active   Interventions for Problem Two Long Term Goal   Called Dr. Lorrene Boyle about increasing his gabapentin. Pt to discuss other pain med options if increase does not help or if pt has side effects due to his kidney function.   THN Long Term Goal (31-90) days  Pt pain will be reduced from current level of 8 to below 5 over the next 90 days.   THN Long Term Goal Start Date  02/25/15   THN CM Short Term Goal #1 (0-30 days)  Pt to see nephrologis on 03/08/15.   THN CM Short Term Goal #1 Start Date  02/25/15   Interventions for Short Term Goal #2   Encouraged keeping this very important appt.     Deloria Lair [facility] [place] [phone]

## 2015-03-08 DIAGNOSIS — E119 Type 2 diabetes mellitus without complications: Secondary | ICD-10-CM | POA: Diagnosis not present

## 2015-03-08 DIAGNOSIS — N2581 Secondary hyperparathyroidism of renal origin: Secondary | ICD-10-CM | POA: Diagnosis not present

## 2015-03-08 DIAGNOSIS — N189 Chronic kidney disease, unspecified: Secondary | ICD-10-CM | POA: Diagnosis not present

## 2015-03-08 DIAGNOSIS — D631 Anemia in chronic kidney disease: Secondary | ICD-10-CM | POA: Diagnosis not present

## 2015-03-11 ENCOUNTER — Other Ambulatory Visit: Payer: Self-pay | Admitting: *Deleted

## 2015-03-12 ENCOUNTER — Encounter: Payer: Self-pay | Admitting: *Deleted

## 2015-03-12 DIAGNOSIS — E1121 Type 2 diabetes mellitus with diabetic nephropathy: Secondary | ICD-10-CM | POA: Diagnosis not present

## 2015-03-12 DIAGNOSIS — N183 Chronic kidney disease, stage 3 (moderate): Secondary | ICD-10-CM | POA: Diagnosis not present

## 2015-03-12 DIAGNOSIS — Z794 Long term (current) use of insulin: Secondary | ICD-10-CM | POA: Diagnosis not present

## 2015-03-12 DIAGNOSIS — E1142 Type 2 diabetes mellitus with diabetic polyneuropathy: Secondary | ICD-10-CM | POA: Diagnosis not present

## 2015-03-12 NOTE — Patient Outreach (Signed)
Patrick Boyle) Care Management   03/12/2015  Patrick Boyle July 29, 1934 [id number]  Patrick Boyle is an 81 y.o. male  Subjective:   Objective:   Review of Systems  Constitutional: Negative.   HENT: Negative.   Eyes: Negative.   Respiratory: Negative.   Cardiovascular: Negative.   Gastrointestinal: Negative.   Genitourinary: Negative.   Musculoskeletal: Positive for myalgias.  Skin: Negative.   Neurological: Positive for tingling.  Endo/Heme/Allergies: Negative.   Psychiatric/Behavioral: Positive for depression.    Physical Exam  Constitutional: He is oriented to person, place, and time. He appears well-developed and well-nourished.  HENT:  Head: Normocephalic.  Neck: Normal range of motion. Neck supple.  Cardiovascular: Normal rate and regular rhythm.   Respiratory: Breath sounds normal.  GI: Bowel sounds are normal.  Musculoskeletal: Normal range of motion.  Neurological: He is alert and oriented to person, place, and time.  Skin: Skin is warm and dry.  Psychiatric: He has a normal mood and affect.  Depressed that nephrologist introduced the idea of dailysis.    Current Medications:   Current Outpatient Prescriptions  Medication Sig Dispense Refill  . Acetaminophen (ARTHRITIS PAIN RELIEF PO) Take 650 mg by mouth 2 (two) times daily.     Marland Kitchen aspirin EC 81 MG tablet Take 81 mg by mouth daily.    . bimatoprost (LUMIGAN) 0.03 % ophthalmic solution Place 1 drop into both eyes at bedtime.     . calcitRIOL (ROCALTROL) 0.25 MCG capsule Take 0.25 mcg by mouth daily.    . calcium-vitamin D (OSCAL WITH D) 500-200 MG-UNIT per tablet Take 1 tablet by mouth every morning.    . cetirizine (ZYRTEC) 10 MG tablet Take 5 mg by mouth daily.      . cholecalciferol (VITAMIN D) 1000 UNITS tablet Take 1,000 Units by mouth daily.    Marland Kitchen donepezil (ARICEPT) 10 MG tablet Take 10 mg by mouth at bedtime.    . fish oil-omega-3 fatty acids 1000 MG capsule Take 2 g by mouth 2 (two) times  daily.     . furosemide (LASIX) 20 MG tablet TAKE 1 TABLET EVERY OTHER DAY ORALLY FOR 30  0  . gabapentin (NEURONTIN) 300 MG capsule Take 300 mg by mouth 3 (three) times daily.    Marland Kitchen glimepiride (AMARYL) 4 MG tablet Take 4 mg by mouth 2 (two) times daily.      . lansoprazole (PREVACID) 30 MG capsule Take 30 mg by mouth daily as needed (for acid reflex).     Marland Kitchen levothyroxine (SYNTHROID, LEVOTHROID) 50 MCG tablet Take 50 mcg by mouth daily before breakfast.    . MECLIZINE HCL PO Take 5 mg by mouth daily as needed (vertigo).    . ONE TOUCH ULTRA TEST test strip TEST 1 TIME DAILY.  3  . simvastatin (ZOCOR) 40 MG tablet Take 40 mg by mouth at bedtime.      . sitaGLIPtin (JANUVIA) 50 MG tablet Take 50 mg by mouth daily with breakfast.    . sodium bicarbonate 650 MG tablet Take 1 tablet (650 mg total) by mouth 2 (two) times daily. 60 tablet 1  . tamsulosin (FLOMAX) 0.4 MG CAPS capsule Take 1 capsule (0.4 mg total) by mouth daily after supper. 30 capsule 1  . traMADol (ULTRAM) 50 MG tablet Take 1 tablet by mouth every 6 (six) hours as needed (pain). For 10 days  1   No current facility-administered medications for this visit.    Functional Status:   In your present state  of health, do you have any difficulty performing the following activities: 02/19/2015 08/21/2014  Hearing? Y N  Vision? N N  Difficulty concentrating or making decisions? Y N  Walking or climbing stairs? Y Y  Dressing or bathing? N N  Doing errands, shopping? N N  Preparing Food and eating ? N -  Using the Toilet? N -  In the past six months, have you accidently leaked urine? N -  Do you have problems with loss of bowel control? N -  Managing your Medications? N -  Managing your Finances? Y -  Housekeeping or managing your Housekeeping? Y -    Fall/Depression Screening:    PHQ 2/9 Scores 02/19/2015  PHQ - 2 Score 1    Assessment:  CKD                          Diabetic Neurpathy                          Diabeted control  Not at Goal  Plan: Today we watched an Emmi program on CKD and I assigned another.           Pt may need additional pain med for neuopathy that his kidneys can tolerate.           Needs referral to Dr. Buddy Duty ASAP           I will see him in 2 weeks.  [facility] CM Care Plan Problem One        Patient Outreach from 03/11/2015 in [place] Problem One  Pt glucose levels are elevated.   Care Plan for Problem One  Active   THN Long Term Goal (31-90 days)  Pt Hgb A1C will be reduced to at or below 7.0 over the next 90 days.   THN Long Term Goal Start Date  02/25/15   Interventions for Problem One Long Term Goal  daughter to call and schedule appt with Dr. Buddy Duty   [facility] CM Short Term Goal #1 (0-30 days)  Pt will review Emmi programs on diabetes control over the next 30 days.   THN CM Short Term Goal #1 Start Date  02/25/15   Interventions for Short Term Goal #1  Assighned CKD emmis today and we watched onevideo    [facility] CM Care Plan Problem Two        Patient Outreach from 03/11/2015 in [place] Problem Two  Pt has uncontrolled neuropathic pain.   Care Plan for Problem Two  Active   Interventions for Problem Two Long Term Goal   Med caused side effects/unable to tolerate ncrease, will discuss alternatives.   THN Long Term Goal (31-90) days  Pt pain will be reduced from current level of 8 to below 5 over the next 90 days.   THN Long Term Goal Start Date  02/25/15   THN CM Short Term Goal #1 (0-30 days)  Pt saw nepthfologist.   THN CM Short Term Goal #1 Start Date  02/25/15   Interventions for Short Term Goal #2   Pt saw nephrologist, decreased gabapentin, may take tramadl     Deloria Lair [facility] [place] [phone]

## 2015-03-14 ENCOUNTER — Encounter: Payer: Self-pay | Admitting: *Deleted

## 2015-03-14 ENCOUNTER — Other Ambulatory Visit: Payer: Self-pay | Admitting: *Deleted

## 2015-03-14 DIAGNOSIS — N184 Chronic kidney disease, stage 4 (severe): Secondary | ICD-10-CM

## 2015-03-14 DIAGNOSIS — Z01810 Encounter for preprocedural cardiovascular examination: Secondary | ICD-10-CM

## 2015-03-14 NOTE — Patient Outreach (Signed)
[facility]) Care Management   03/14/2015  Patrick Boyle 09/03/1933 [id number]  Patrick Boyle is an 81 y.o. male  Subjective: Patrick Boyle reports going to see the nephrologist and he was surprised and disappointed that they started a conversation about dialysis. I listened and supported pt and reassured him they start this process early so people can prepare mentally and physically to this change in treatment and to help make decisions about the alternative methods or even if he wants treatment at all.   Objective:  BP 120/70 mmHg  Pulse 88  Resp 16  Review of Systems  Constitutional: Negative.   HENT: Negative.   Eyes: Negative.   Respiratory: Negative.   Cardiovascular: Negative.   Gastrointestinal: Negative.   Genitourinary: Negative.   Musculoskeletal: Negative.   Skin: Negative.   Neurological: Positive for tingling.  Endo/Heme/Allergies: Negative.   Psychiatric/Behavioral: Positive for depression.    Physical Exam  Constitutional: He is oriented to person, place, and time. He appears well-developed and well-nourished.  HENT:  Head: Normocephalic.  Cardiovascular: Normal rate, regular rhythm and normal heart sounds.   Respiratory: Effort normal and breath sounds normal.  GI: Soft. Bowel sounds are normal.  Musculoskeletal: Normal range of motion.  Neurological: He is alert and oriented to person, place, and time.  Skin: Skin is warm.  Psychiatric: He has a normal mood and affect.    Current Medications:   Current Outpatient Prescriptions  Medication Sig Dispense Refill  . Acetaminophen (ARTHRITIS PAIN RELIEF PO) Take 650 mg by mouth 2 (two) times daily.     Marland Kitchen aspirin EC 81 MG tablet Take 81 mg by mouth daily.    . bimatoprost (LUMIGAN) 0.03 % ophthalmic solution Place 1 drop into both eyes at bedtime.     . calcitRIOL (ROCALTROL) 0.25 MCG capsule Take 0.25 mcg by mouth daily.    . calcium-vitamin D (OSCAL WITH D) 500-200 MG-UNIT per tablet Take 1  tablet by mouth every morning.    . cetirizine (ZYRTEC) 10 MG tablet Take 5 mg by mouth daily.      . cholecalciferol (VITAMIN D) 1000 UNITS tablet Take 1,000 Units by mouth daily.    Marland Kitchen donepezil (ARICEPT) 10 MG tablet Take 10 mg by mouth at bedtime.    . fish oil-omega-3 fatty acids 1000 MG capsule Take 2 g by mouth 2 (two) times daily.     . furosemide (LASIX) 20 MG tablet TAKE 1 TABLET EVERY OTHER DAY ORALLY FOR 30  0  . gabapentin (NEURONTIN) 300 MG capsule Take 300 mg by mouth 3 (three) times daily.    Marland Kitchen glimepiride (AMARYL) 4 MG tablet Take 4 mg by mouth 2 (two) times daily.      . lansoprazole (PREVACID) 30 MG capsule Take 30 mg by mouth daily as needed (for acid reflex).     Marland Kitchen levothyroxine (SYNTHROID, LEVOTHROID) 50 MCG tablet Take 50 mcg by mouth daily before breakfast.    . MECLIZINE HCL PO Take 5 mg by mouth daily as needed (vertigo).    . ONE TOUCH ULTRA TEST test strip TEST 1 TIME DAILY.  3  . simvastatin (ZOCOR) 40 MG tablet Take 40 mg by mouth at bedtime.      . sitaGLIPtin (JANUVIA) 50 MG tablet Take 50 mg by mouth daily with breakfast.    . sodium bicarbonate 650 MG tablet Take 1 tablet (650 mg total) by mouth 2 (two) times daily. 60 tablet 1  . tamsulosin (FLOMAX) 0.4 MG CAPS  capsule Take 1 capsule (0.4 mg total) by mouth daily after supper. 30 capsule 1  . traMADol (ULTRAM) 50 MG tablet Take 1 tablet by mouth every 6 (six) hours as needed (pain). For 10 days  1   No current facility-administered medications for this visit.    Functional Status:   In your present state of health, do you have any difficulty performing the following activities: 02/19/2015 08/21/2014  Hearing? Y N  Vision? N N  Difficulty concentrating or making decisions? Y N  Walking or climbing stairs? Y Y  Dressing or bathing? N N  Doing errands, shopping? N N  Preparing Food and eating ? N -  Using the Toilet? N -  In the past six months, have you accidently leaked urine? N -  Do you have problems  with loss of bowel control? N -  Managing your Medications? N -  Managing your Finances? Y -  Housekeeping or managing your Housekeeping? Y -    Fall/Depression Screening:    PHQ 2/9 Scores 02/19/2015  PHQ - 2 Score 1    Assessment:  CKD                          Diabetes with severe peripheral neuropathy.  Plan: I will educate pt and support him through the potential dialysis education and treatment.  [facility] CM Care Plan Problem One        Patient Outreach from 03/11/2015 in [place] Problem One  Pt glucose levels are elevated.   Care Plan for Problem One  Active   THN Long Term Goal (31-90 days)  Pt Hgb A1C will be reduced to at or below 7.0 over the next 90 days.   THN Long Term Goal Start Date  02/25/15   Interventions for Problem One Long Term Goal  daughter to call and schedule appt with Dr. Buddy Duty   [facility] CM Short Term Goal #1 (0-30 days)  Pt will review Emmi programs on diabetes control over the next 30 days.   THN CM Short Term Goal #1 Start Date  02/25/15   Interventions for Short Term Goal #1  Assighned CKD emmis today and we watched onevideo    [facility] CM Care Plan Problem Two        Patient Outreach from 03/11/2015 in [place] Problem Two  Pt has uncontrolled neuropathic pain.   Care Plan for Problem Two  Active   Interventions for Problem Two Long Term Goal   Med caused side effects/unable to tolerate ncrease, will discuss alternatives.   THN Long Term Goal (31-90) days  Pt pain will be reduced from current level of 8 to below 5 over the next 90 days.   THN Long Term Goal Start Date  02/25/15   THN CM Short Term Goal #1 (0-30 days)  Pt saw nepthfologist.   THN CM Short Term Goal #1 Start Date  02/25/15   Interventions for Short Term Goal #2   Pt saw nephrologist, decreased gabapentin, may take tramadl     Deloria Lair [facility] [place] [phone]

## 2015-03-22 ENCOUNTER — Other Ambulatory Visit: Payer: Self-pay | Admitting: *Deleted

## 2015-03-22 NOTE — Patient Outreach (Signed)
[facility]) Care Management   03/22/2015  Patrick Boyle 1933/10/30 [id number]  Patrick Boyle is an 81 y.o. male  Subjective:  Pt has been to see Dr. Maceo Pro and was started on [place]. He has been titrating his dose every 3 days and is now taking 19 units. Today's FBS was 218. He is feeling very confident in his injection process. He is going to have an initial visit with Dr. Buddy Duty, Nov. 5th. He will go to a dialysis options seminar next week, July 27th. He will see the vascular surgeon, Dr. Oneida Alar, for a consultation on August 18th.   Objective:   Review of Systems  Constitutional: Negative.   HENT: Negative.   Eyes: Negative.   Respiratory: Negative.   Cardiovascular: Negative.   Gastrointestinal: Negative.   Genitourinary: Negative.   Musculoskeletal: Negative.   Skin: Negative.   Neurological: Positive for tingling.  Endo/Heme/Allergies: Negative.   Psychiatric/Behavioral: Negative.     Physical Exam  Constitutional: He is oriented to person, place, and time.  Cardiovascular: Normal rate, regular rhythm and normal heart sounds.   Respiratory: Effort normal and breath sounds normal.  GI: Soft. Bowel sounds are normal.  Neurological: He is alert and oriented to person, place, and time.  Skin: Skin is warm and dry.  Psychiatric: He has a normal mood and affect.    Current Medications:   Current Outpatient Prescriptions  Medication Sig Dispense Refill  . Acetaminophen (ARTHRITIS PAIN RELIEF PO) Take 650 mg by mouth 2 (two) times daily.     Marland Kitchen aspirin EC 81 MG tablet Take 81 mg by mouth daily.    . bimatoprost (LUMIGAN) 0.03 % ophthalmic solution Place 1 drop into both eyes at bedtime.     . calcitRIOL (ROCALTROL) 0.25 MCG capsule Take 0.25 mcg by mouth daily.    . calcium-vitamin D (OSCAL WITH D) 500-200 MG-UNIT per tablet Take 1 tablet by mouth every morning.    . cetirizine (ZYRTEC) 10 MG tablet Take 5 mg by mouth daily.      . cholecalciferol  (VITAMIN D) 1000 UNITS tablet Take 1,000 Units by mouth daily.    Marland Kitchen donepezil (ARICEPT) 10 MG tablet Take 10 mg by mouth at bedtime.    . fish oil-omega-3 fatty acids 1000 MG capsule Take 2 g by mouth 2 (two) times daily.     . furosemide (LASIX) 20 MG tablet TAKE 1 TABLET EVERY OTHER DAY ORALLY FOR 30  0  . gabapentin (NEURONTIN) 300 MG capsule Take 300 mg by mouth 3 (three) times daily.    Marland Kitchen glimepiride (AMARYL) 4 MG tablet Take 4 mg by mouth 2 (two) times daily.      . lansoprazole (PREVACID) 30 MG capsule Take 30 mg by mouth daily as needed (for acid reflex).     Marland Kitchen levothyroxine (SYNTHROID, LEVOTHROID) 50 MCG tablet Take 50 mcg by mouth daily before breakfast.    . MECLIZINE HCL PO Take 5 mg by mouth daily as needed (vertigo).    . ONE TOUCH ULTRA TEST test strip TEST 1 TIME DAILY.  3  . simvastatin (ZOCOR) 40 MG tablet Take 40 mg by mouth at bedtime.      . sitaGLIPtin (JANUVIA) 50 MG tablet Take 50 mg by mouth daily with breakfast.    . sodium bicarbonate 650 MG tablet Take 1 tablet (650 mg total) by mouth 2 (two) times daily. 60 tablet 1  . tamsulosin (FLOMAX) 0.4 MG CAPS capsule Take 1 capsule (0.4 mg  total) by mouth daily after supper. 30 capsule 1  . traMADol (ULTRAM) 50 MG tablet Take 1 tablet by mouth every 6 (six) hours as needed (pain). For 10 days  1   No current facility-administered medications for this visit.    Functional Status:   In your present state of health, do you have any difficulty performing the following activities: 02/19/2015 08/21/2014  Hearing? Y N  Vision? N N  Difficulty concentrating or making decisions? Y N  Walking or climbing stairs? Y Y  Dressing or bathing? N N  Doing errands, shopping? N N  Preparing Food and eating ? N -  Using the Toilet? N -  In the past six months, have you accidently leaked urine? N -  Do you have problems with loss of bowel control? N -  Managing your Medications? N -  Managing your Finances? Y -  Housekeeping or  managing your Housekeeping? Y -    Fall/Depression Screening:    PHQ 2/9 Scores 02/19/2015  PHQ - 2 Score 1    Assessment:  Improved diabetes control on Levemir.                         Peripheral neuropathy pain not well controlled - consider alternatives or adjunct                                 therapy.  Plan: I will see them again in one month.  [facility] CM Care Plan Problem One        Patient Outreach from 03/22/2015 in [place] Problem One  Pt glucose levels are elevated.   Care Plan for Problem One  Active   THN Long Term Goal (31-90 days)  Pt Hgb A1C will be reduced to at or below 7.0 over the next 90 days.   THN Long Term Goal Start Date  02/25/15   Interventions for Problem One Long Term Goal  More carb counting information given. Discussed current diet - very good compliance. Pt now on Levemir and glucose levels are improving. Praised pt for his adaptation to taking an injection and for following titration regimen.   THN CM Short Term Goal #1 (0-30 days)  Pt will review Emmi programs on diabetes control over the next 30 days.   THN CM Short Term Goal #1 Start Date  02/25/15   [facility] CM Short Term Goal #1 Met Date  03/22/15    [facility] CM Care Plan Problem Two        Patient Outreach from 03/22/2015 in [place] Problem Two  Pt has uncontrolled neuropathic pain.   Care Plan for Problem Two  Active   Interventions for Problem Two Long Term Goal   We did not address other alternatives today - concentrated on CKD ed. Will revisit in 30 days.   THN Long Term Goal (31-90) days  Pt pain will be reduced from current level of 8 to below 5 over the next 90 days.   THN Long Term Goal Start Date  02/25/15   THN CM Short Term Goal #1 (0-30 days)  Pt saw nepthfologist.   THN CM Short Term Goal #1 Start Date  02/25/15   [facility] CM Short Term Goal #1 Met Date   03/22/15   Interventions for Short Term Goal #2   Pt saw nephrologist,  decreased  gabapentin, may take tramadl

## 2015-04-08 ENCOUNTER — Encounter: Payer: Medicare Other | Admitting: Nurse Practitioner

## 2015-04-10 ENCOUNTER — Ambulatory Visit (INDEPENDENT_AMBULATORY_CARE_PROVIDER_SITE_OTHER): Payer: Medicare Other | Admitting: Nurse Practitioner

## 2015-04-10 ENCOUNTER — Encounter: Payer: Self-pay | Admitting: *Deleted

## 2015-04-10 ENCOUNTER — Encounter: Payer: Self-pay | Admitting: Nurse Practitioner

## 2015-04-10 VITALS — BP 122/70 | HR 58 | Ht 71.0 in | Wt 194.4 lb

## 2015-04-10 DIAGNOSIS — Z006 Encounter for examination for normal comparison and control in clinical research program: Secondary | ICD-10-CM

## 2015-04-10 DIAGNOSIS — I259 Chronic ischemic heart disease, unspecified: Secondary | ICD-10-CM | POA: Diagnosis not present

## 2015-04-10 DIAGNOSIS — I10 Essential (primary) hypertension: Secondary | ICD-10-CM | POA: Diagnosis not present

## 2015-04-10 DIAGNOSIS — I519 Heart disease, unspecified: Secondary | ICD-10-CM | POA: Diagnosis not present

## 2015-04-10 NOTE — Patient Instructions (Signed)
Medication Instructions:  Your physician recommends that you continue on your current medications as directed. Please refer to the Current Medication list given to you today.  Labwork: None ordered  Testing/Procedures: None ordered  Follow-Up: Remote monitoring is used to monitor your Pacemaker of ICD from home. This monitoring reduces the number of office visits required to check your device to one time per year. It allows [place] to keep an eye on the functioning of your device to ensure it is working properly. You are scheduled for a device check from home on 07/10/15. You may send your transmission at any time that day. If you have a wireless device, the transmission will be sent automatically. After your physician reviews your transmission, you will receive a postcard with your next transmission date.  Your physician wants you to follow-up in: 6 months with Dr. Rayann Heman. You will receive a reminder letter in the mail two months in advance. If you don't receive a letter, please call our office to schedule the follow-up appointment.  Any Other Special Instructions Will Be Listed Below (If Applicable). Thank you for choosing [facility]!!

## 2015-04-10 NOTE — Progress Notes (Signed)
Electrophysiology Office Note Date: 04/10/2015  ID:  Patrick Boyle, DOB [birth year], MRN [id number]  PCP: Abigail Miyamoto, MD Primary Cardiologist: Irish Lack Electrophysiologist: Allred  CC: Routine ICD follow-up/Analyze ST close out visit  Patrick Boyle is a 81 y.o. male seen today for Dr Rayann Heman.  He presents today for routine electrophysiology followup.  Since last being seen in our clinic, the patient reports doing reasonably well.  He is still being followed closely by urology and nephrology for urinary obstruction and associated renal failure. Dr Lorrene Reid has referred to Dr Oneida Alar for fistula placement. He has occasional shortness of breath and weight gain and takes an extra Lasix as needed. He denies chest pain, palpitations, PND, orthopnea, nausea, vomiting, dizziness, syncope, edema, weight gain, or early satiety.  He has not had ICD shocks.   Device History: STJ single chamber ICD implanted 2012 for ICM History of appropriate therapy: No History of AAD therapy: No   Past Medical History  Diagnosis Date  . Diabetes mellitus     dr Olen Pel  . Hyperlipidemia   . Hypertension   . Coronary artery disease     s/p CABG 1997  . GERD (gastroesophageal reflux disease)   . Arthritis   . Allergic rhinitis   . Hyperplastic colon polyp   . Diverticulitis, colon   . Meniere's syndrome   . Tremor, essential     dr Jannifer Franklin  . COPD, mild     dr clance  . MI (myocardial infarction) 1997, 2005  . Ischemic cardiomyopathy     sp ICD (SJM) implanted by Dr Rayann Heman   Past Surgical History  Procedure Laterality Date  . Coronary artery bypass graft  [place]  . Cardiac catheterization  2005    medical therapy  . Cardiac defibrillator placement      ICD implanted by Dr Rayann Heman, Analyze ST study patient  . Esophagogastroduodenoscopy N/A 08/25/2014    Procedure: ESOPHAGOGASTRODUODENOSCOPY (EGD);  Surgeon: Lear Ng, MD;  Location: [facility];  Service: Endoscopy;  Laterality: N/A;   . Colonoscopy N/A 08/25/2014    Procedure: COLONOSCOPY;  Surgeon: Lear Ng, MD;  Location: [facility];  Service: Endoscopy;  Laterality: N/A;    Current Outpatient Prescriptions  Medication Sig Dispense Refill  . Acetaminophen (ARTHRITIS PAIN RELIEF PO) Take 650 mg by mouth 2 (two) times daily.     Marland Kitchen aspirin EC 81 MG tablet Take 81 mg by mouth daily.    . bimatoprost (LUMIGAN) 0.03 % ophthalmic solution Place 1 drop into both eyes at bedtime.     . calcitRIOL (ROCALTROL) 0.25 MCG capsule Take 0.25 mcg by mouth daily.    . calcium-vitamin D (OSCAL WITH D) 500-200 MG-UNIT per tablet Take 1 tablet by mouth every morning.    . cetirizine (ZYRTEC) 10 MG tablet Take 5 mg by mouth daily.      . cholecalciferol (VITAMIN D) 1000 UNITS tablet Take 1,000 Units by mouth daily.    Marland Kitchen donepezil (ARICEPT) 10 MG tablet Take 10 mg by mouth at bedtime.    . fish oil-omega-3 fatty acids 1000 MG capsule Take 2 g by mouth 2 (two) times daily.     . furosemide (LASIX) 20 MG tablet TAKE 1 TABLET EVERY OTHER DAY ORALLY FOR 30  0  . gabapentin (NEURONTIN) 300 MG capsule Take 300 mg by mouth 3 (three) times daily.    Marland Kitchen glimepiride (AMARYL) 4 MG tablet Take 4 mg by mouth 2 (two) times daily.      Marland Kitchen  lansoprazole (PREVACID) 30 MG capsule Take 30 mg by mouth daily as needed (for acid reflex).     Marland Kitchen levothyroxine (SYNTHROID, LEVOTHROID) 50 MCG tablet Take 50 mcg by mouth daily before breakfast.    . MECLIZINE HCL PO Take 5 mg by mouth daily as needed (vertigo).    . ONE TOUCH ULTRA TEST test strip TEST 1 TIME DAILY.  3  . simvastatin (ZOCOR) 40 MG tablet Take 40 mg by mouth at bedtime.      . sitaGLIPtin (JANUVIA) 50 MG tablet Take 50 mg by mouth daily with breakfast.    . sodium bicarbonate 650 MG tablet Take 1 tablet (650 mg total) by mouth 2 (two) times daily. 60 tablet 1  . tamsulosin (FLOMAX) 0.4 MG CAPS capsule Take 1 capsule (0.4 mg total) by mouth daily after supper. 30 capsule 1  . traMADol  (ULTRAM) 50 MG tablet Take 1 tablet by mouth every 6 (six) hours as needed (pain). For 10 days  1   No current facility-administered medications for this visit.    Allergies:   Metformin and related and Ramipril   Social History: Social History   Social History  . Marital Status: Married    Spouse Name: N/A  . Number of Children: N/A  . Years of Education: N/A   Occupational History  . retired    Social History Main Topics  . Smoking status: Former Smoker -- 35 years    Types: Cigarettes    Quit date: 09/01/1995  . Smokeless tobacco: Not on file  . Alcohol Use: 0.0 oz/week     Comment: occasional scotch  . Drug Use: No  . Sexual Activity: Not on file   Other Topics Concern  . Not on file   Social History Narrative   Pt lives in [place] with wife.   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 Parkinsonism Father   . Stomach cancer Mother   . Diabetes Mother     Review of Systems: All other systems reviewed and are otherwise negative except as noted above.   Physical Exam: VS:  BP 122/70 mmHg  Pulse 58  Ht 5\' 11"  (1.803 m)  Wt 194 lb 6.4 oz (88.179 kg)  BMI 27.13 kg/m2  SpO2 98% , BMI Body mass index is 27.13 kg/(m^2).  GEN- The patient is elderly appearing, alert and oriented x 3 today.   HEENT: normocephalic, atraumatic; sclera clear, conjunctiva pink; hearing intact; oropharynx clear; neck supple  Lungs- Clear to ausculation bilaterally, normal work of breathing.  No wheezes, rales, rhonchi Heart- Regular rate and rhythm  GI- soft, non-tender, non-distended, bowel sounds present  Extremities- no clubbing, cyanosis, or edema; DP/PT/radial pulses 2+ bilaterally MS- no significant deformity or atrophy Skin- warm and dry, no rash or lesion; ICD pocket well healed Psych- euthymic mood, full affect Neuro- strength and sensation are intact  ICD interrogation- reviewed in detail today,  See  PACEART report  EKG:  EKG is ordered today and demonstrates sinus bradycardia, rate 58, 1st degree AV block (PR 238), poor R wave progression.   Recent Labs: 08/20/2014: ALT 21 08/21/2014: Magnesium 1.5; TSH 1.877 08/27/2014: BUN 36*; Creatinine, Ser 2.43*; Hemoglobin 8.4*; Platelets 133*; Potassium 3.8; Sodium 137   Wt Readings from Last 3 Encounters:  04/10/15 194 lb 6.4 oz (88.179 kg)  03/22/15 190 lb (86.183 kg)  03/12/15 190 lb (86.183 kg)  Other studies Reviewed: Additional studies/ records that were reviewed today include: Dr Irish Lack and Dr Jackalyn Lombard office notes  Assessment and Plan:  1.  Chronic systolic dysfunction euvolemic today Stable on an appropriate medical regimen Normal ICD function See [facility] report No changes today No ACE-I or ARB 2/2 renal failure  2.  CAD No recent ischemic symptoms Continue ASA 81mg  daily   3.  HTN Stable No change required today    Current medicines are reviewed at length with the patient today.   The patient does not have concerns regarding his medicines.  The following changes were made today:  none  Labs/ tests ordered today include: none   Disposition:   Follow up with [facility], Dr Rayann Heman in 6 months    Signed, Chanetta Marshall, NP 04/10/2015 3:52 PM  [facility] [address] [place]  [phone] (office) [phone] (fax)

## 2015-04-11 LAB — CUP PACEART INCLINIC DEVICE CHECK
Lead Channel Setting Pacing Pulse Width: 0.5 ms
Lead Channel Setting Sensing Sensitivity: 0.5 mV
MDC IDC SESS DTM: 20160811141935
MDC IDC SET LEADCHNL RV PACING AMPLITUDE: 2.5 V
MDC IDC SET ZONE DETECTION INTERVAL: 300 ms
MDC IDC SET ZONE DETECTION INTERVAL: 350 ms
Pulse Gen Serial Number: 1016528

## 2015-04-17 ENCOUNTER — Encounter: Payer: Self-pay | Admitting: Vascular Surgery

## 2015-04-17 DIAGNOSIS — E1122 Type 2 diabetes mellitus with diabetic chronic kidney disease: Secondary | ICD-10-CM | POA: Diagnosis not present

## 2015-04-17 DIAGNOSIS — Z794 Long term (current) use of insulin: Secondary | ICD-10-CM | POA: Diagnosis not present

## 2015-04-17 DIAGNOSIS — N184 Chronic kidney disease, stage 4 (severe): Secondary | ICD-10-CM | POA: Diagnosis not present

## 2015-04-17 DIAGNOSIS — E782 Mixed hyperlipidemia: Secondary | ICD-10-CM | POA: Diagnosis not present

## 2015-04-17 DIAGNOSIS — E1142 Type 2 diabetes mellitus with diabetic polyneuropathy: Secondary | ICD-10-CM | POA: Diagnosis not present

## 2015-04-18 ENCOUNTER — Encounter: Payer: Self-pay | Admitting: Vascular Surgery

## 2015-04-18 ENCOUNTER — Other Ambulatory Visit: Payer: Self-pay

## 2015-04-18 ENCOUNTER — Ambulatory Visit (HOSPITAL_COMMUNITY)
Admission: RE | Admit: 2015-04-18 | Discharge: 2015-04-18 | Disposition: A | Discharge status: Home / Self Care | Payer: Medicare Other | Source: Ambulatory Visit | Attending: Vascular Surgery | Admitting: Vascular Surgery

## 2015-04-18 ENCOUNTER — Ambulatory Visit (INDEPENDENT_AMBULATORY_CARE_PROVIDER_SITE_OTHER): Payer: Medicare Other | Admitting: Vascular Surgery

## 2015-04-18 ENCOUNTER — Ambulatory Visit (INDEPENDENT_AMBULATORY_CARE_PROVIDER_SITE_OTHER)
Admission: RE | Admit: 2015-04-18 | Discharge: 2015-04-18 | Disposition: A | Discharge status: Home / Self Care | Payer: Medicare Other | Source: Ambulatory Visit | Attending: Vascular Surgery | Admitting: Vascular Surgery

## 2015-04-18 VITALS — BP 140/62 | HR 60 | Temp 97.5°F | Resp 14 | Ht 70.5 in | Wt 190.0 lb

## 2015-04-18 DIAGNOSIS — I259 Chronic ischemic heart disease, unspecified: Secondary | ICD-10-CM

## 2015-04-18 DIAGNOSIS — N184 Chronic kidney disease, stage 4 (severe): Secondary | ICD-10-CM

## 2015-04-18 DIAGNOSIS — Z01810 Encounter for preprocedural cardiovascular examination: Secondary | ICD-10-CM

## 2015-04-18 NOTE — Progress Notes (Signed)
[facility] [place] HISTORY AND PHYSICAL   History of Present Illness:  Patient is a 81 y.o.  male who presents for placement of a permanent hemodialysis access. The patient is right handed .  The patient is not currently on hemodialysis.  The cause of renal failure is thought to be secondary to diabetes and hypertension.  He also has a left-sided AICD. Other chronic medical problems include hyperlipidemia, coronary disease, reflux.  Past Medical History  Diagnosis Date  . Diabetes mellitus     dr Olen Pel  . Hyperlipidemia   . Hypertension   . Coronary artery disease     s/p CABG 1997  . GERD (gastroesophageal reflux disease)   . Arthritis   . Allergic rhinitis   . Hyperplastic colon polyp   . Diverticulitis, colon   . Meniere's syndrome   . Tremor, essential     dr Jannifer Franklin  . COPD, mild     dr clance  . MI (myocardial infarction) 1997, 2005  . Ischemic cardiomyopathy     sp ICD (SJM) implanted by Dr Rayann Heman    Past Surgical History  Procedure Laterality Date  . Coronary artery bypass graft  [place]  . Cardiac catheterization  2005    medical therapy  . Cardiac defibrillator placement      ICD implanted by Dr Rayann Heman, Analyze ST study patient  . Esophagogastroduodenoscopy N/A 08/25/2014    Procedure: ESOPHAGOGASTRODUODENOSCOPY (EGD);  Surgeon: Lear Ng, MD;  Location: [facility];  Service: Endoscopy;  Laterality: N/A;  . Colonoscopy N/A 08/25/2014    Procedure: COLONOSCOPY;  Surgeon: Lear Ng, MD;  Location: [facility];  Service: Endoscopy;  Laterality: N/A;     Social History Social History  Substance Use Topics  . Smoking status: Former Smoker -- 35 years    Types: Cigarettes    Quit date: 09/01/1995  . Smokeless tobacco: None  . Alcohol Use: 0.0 oz/week     Comment: occasional scotch    Family History Family History  Problem Relation Age of Onset  . Parkinsonism Father   . Stomach cancer Mother   . Diabetes  Mother     Allergies  Allergies  Allergen Reactions  . Metformin And Related Other (See Comments)    GI sensitivity with high doses  . Ramipril Other (See Comments)    Cough     Current Outpatient Prescriptions  Medication Sig Dispense Refill  . Acetaminophen (ARTHRITIS PAIN RELIEF PO) Take 650 mg by mouth 2 (two) times daily.     Marland Kitchen aspirin EC 81 MG tablet Take 81 mg by mouth daily.    . bimatoprost (LUMIGAN) 0.03 % ophthalmic solution Place 1 drop into both eyes at bedtime.     . calcitRIOL (ROCALTROL) 0.25 MCG capsule Take 0.25 mcg by mouth daily.    . calcium-vitamin D (OSCAL WITH D) 500-200 MG-UNIT per tablet Take 1 tablet by mouth every morning.    . cetirizine (ZYRTEC) 10 MG tablet Take 5 mg by mouth daily.      . cholecalciferol (VITAMIN D) 1000 UNITS tablet Take 1,000 Units by mouth daily.    Marland Kitchen donepezil (ARICEPT) 10 MG tablet Take 10 mg by mouth at bedtime.    . fish oil-omega-3 fatty acids 1000 MG capsule Take 2 g by mouth 2 (two) times daily.     . furosemide (LASIX) 20 MG tablet TAKE 1 TABLET EVERY OTHER DAY ORALLY FOR 30  0  . gabapentin (NEURONTIN)  300 MG capsule Take 300 mg by mouth 3 (three) times daily.    . insulin detemir (LEVEMIR) 100 UNIT/ML injection Inject 34 Units into the skin at bedtime.    . lansoprazole (PREVACID) 30 MG capsule Take 30 mg by mouth daily as needed (for acid reflex).     Marland Kitchen levothyroxine (SYNTHROID, LEVOTHROID) 50 MCG tablet Take 50 mcg by mouth daily before breakfast.    . MECLIZINE HCL PO Take 5 mg by mouth daily as needed (vertigo).    . ONE TOUCH ULTRA TEST test strip TEST 1 TIME DAILY.  3  . simvastatin (ZOCOR) 40 MG tablet Take 40 mg by mouth at bedtime.      . sitaGLIPtin (JANUVIA) 50 MG tablet Take 25 mg by mouth daily with breakfast.     . sodium bicarbonate 650 MG tablet Take 1 tablet (650 mg total) by mouth 2 (two) times daily. 60 tablet 1  . tamsulosin (FLOMAX) 0.4 MG CAPS capsule Take 1 capsule (0.4 mg total) by mouth daily  after supper. 30 capsule 1  . traMADol (ULTRAM) 50 MG tablet Take 1 tablet by mouth every 6 (six) hours as needed (pain). For 10 days  1  . glimepiride (AMARYL) 4 MG tablet Take 4 mg by mouth 2 (two) times daily.       No current facility-administered medications for this visit.    ROS:   General:  No weight loss, Fever, chills  HEENT: No recent headaches, no nasal bleeding, no visual changes, no sore throat  Neurologic: No dizziness, blackouts, seizures. No recent symptoms of stroke or mini- stroke. No recent episodes of slurred speech, or temporary blindness.  Cardiac: No recent episodes of chest pain/pressure, no shortness of breath at rest.  No shortness of breath with exertion.  Denies history of atrial fibrillation or irregular heartbeat  Vascular: No history of rest pain in feet.  No history of claudication.  No history of non-healing ulcer, No history of DVT   Pulmonary: No home oxygen, no productive cough, no hemoptysis,  No asthma or wheezing  Musculoskeletal:  [ ]  Arthritis, [ ]  Low back pain,  [ ]  Joint pain  Hematologic:No history of hypercoagulable state.  No history of easy bleeding.  No history of anemia  Gastrointestinal: No hematochezia or melena,  No gastroesophageal reflux, no trouble swallowing  Urinary: [ ]  chronic Kidney disease, [ ]  on HD - [ ]  MWF or [ ]  TTHS, [ ]  Burning with urination, [ ]  Frequent urination, [ ]  Difficulty urinating;   Skin: No rashes  Psychological: No history of anxiety,  No history of depression   Physical Examination  Filed Vitals:   04/18/15 1230  BP: 140/62  Pulse: 60  Temp: 97.5 F (36.4 C)  TempSrc: Oral  Resp: 14  Height: 5' 10.5" (1.791 m)  Weight: 190 lb (86.183 kg)  SpO2: 98%    Body mass index is 26.87 kg/(m^2).  General:  Alert and oriented, no acute distress HEENT: Normal Neck: No bruit or JVD Pulmonary: Clear to auscultation bilaterally Cardiac: Regular Rate and Rhythm without murmur, left-sided AICD  palpable Gastrointestinal: Soft, non-tender, non-distended, no mass Skin: No rash Extremity Pulses:  2+ radial, brachial pulses bilaterally Musculoskeletal: No deformity or edema  Neurologic: Upper and lower extremity motor 5/5 and symmetric  DATA: Patient had an upper extremity vein mapping as well as upper extremity arterial duplex exam today. I reviewed and interpreted both the studies. Radial artery was 3 mm on the right 2  mm on the left brachial artery was 5 mm bilaterally. The cephalic vein on the right side is of reasonable quality in the forearm 2-1/2-3 mm diameter. The basilic vein is 3-4 mm in the right arm 3-4 mm in the left arm the cephalic vein in the left arm is small in the upper arm and 3 mm in the forearm   ASSESSMENT: Since patient has a left-sided AICD ablated best access placement will be on the right side we'll avoid possible venous hypertension. In light of that he does seem to have a reasonable forearm cephalic vein. I believe the best first option for him would be a right radiocephalic AV fistula. Risks benefits possible complications and procedure details were explained to the patient and his family today. These include but are not limited to nonestrogen the fistula 15-20%, bleeding, infection, numbness around the incision or in the hand. He understands and agrees to proceed. This will be scheduled for 05/01/2015.   PLAN:  See above  Ruta Hinds, MD [facility] [place] Office: [phone] Pager: [phone]

## 2015-04-22 ENCOUNTER — Ambulatory Visit: Payer: Medicare Other | Admitting: *Deleted

## 2015-04-22 ENCOUNTER — Other Ambulatory Visit: Payer: Self-pay | Admitting: *Deleted

## 2015-04-22 NOTE — Patient Outreach (Signed)
[facility]) Care Management   04/22/2015  Patrick Boyle 10/13/1933 [id number]  Patrick Boyle is an 81 y.o. male  Subjective: Pt levemir has been titrated up to 34 units daily. He is only taking his glucose reading in the morning. The fasting levels have come down to <120 mg/Dl.  He has seen the vascular surgeon and had his mapping done for his fistula placement. The surgery is scheduled for 8/31.  He continues to have neuropathic foot pain. He takes neurontin 358m tid. This has to be his max dose because of his CKD. He takes a tramadol prn for hip pain and sometimes for this pain also which does take the edge off.  Objective:  BP 106/40 mmHg  Pulse 64  Resp 16  Wt 185 lb (83.915 kg)  SpO2 96%  Review of Systems  Constitutional: Negative.   HENT: Negative.   Eyes: Positive for blurred vision.  Respiratory: Negative.   Cardiovascular: Negative.   Gastrointestinal: Positive for heartburn and diarrhea.  Genitourinary: Positive for dysuria.  Skin: Negative.     Physical Exam  Constitutional: He is oriented to person, place, and time. He appears well-developed and well-nourished.  HENT:  Head: Normocephalic.  Neck: Normal range of motion. Neck supple.  Cardiovascular: Normal rate, regular rhythm and normal heart sounds.   Respiratory: Effort normal and breath sounds normal.  GI: Soft. Bowel sounds are normal.  Musculoskeletal: Normal range of motion.  Neurological: He is alert and oriented to person, place, and time.  Skin: Skin is warm and dry.  Psychiatric: He has a normal mood and affect.    Current Medications:   Current Outpatient Prescriptions  Medication Sig Dispense Refill  . Acetaminophen (ARTHRITIS PAIN RELIEF PO) Take 650 mg by mouth 2 (two) times daily.     .Marland Kitchenaspirin EC 81 MG tablet Take 81 mg by mouth daily.    . bimatoprost (LUMIGAN) 0.03 % ophthalmic solution Place 1 drop into both eyes at bedtime.     . calcitRIOL (ROCALTROL) 0.25 MCG  capsule Take 0.25 mcg by mouth daily.    . calcium-vitamin D (OSCAL WITH D) 500-200 MG-UNIT per tablet Take 1 tablet by mouth every morning.    . cetirizine (ZYRTEC) 10 MG tablet Take 5 mg by mouth daily.      . cholecalciferol (VITAMIN D) 1000 UNITS tablet Take 1,000 Units by mouth daily.    .Marland Kitchendonepezil (ARICEPT) 10 MG tablet Take 10 mg by mouth at bedtime.    . fish oil-omega-3 fatty acids 1000 MG capsule Take 2 g by mouth 2 (two) times daily.     . furosemide (LASIX) 20 MG tablet TAKE 1 TABLET EVERY OTHER DAY ORALLY FOR 30  0  . gabapentin (NEURONTIN) 300 MG capsule Take 300 mg by mouth 3 (three) times daily.    .Marland Kitchenglimepiride (AMARYL) 4 MG tablet Take 4 mg by mouth 2 (two) times daily.      . insulin detemir (LEVEMIR) 100 UNIT/ML injection Inject 34 Units into the skin at bedtime.    . lansoprazole (PREVACID) 30 MG capsule Take 30 mg by mouth daily as needed (for acid reflex).     .Marland Kitchenlevothyroxine (SYNTHROID, LEVOTHROID) 50 MCG tablet Take 50 mcg by mouth daily before breakfast.    . ONE TOUCH ULTRA TEST test strip TEST 1 TIME DAILY.  3  . simvastatin (ZOCOR) 40 MG tablet Take 40 mg by mouth at bedtime.      . sitaGLIPtin (JANUVIA) 50 MG  tablet Take 25 mg by mouth daily with breakfast.     . sodium bicarbonate 650 MG tablet Take 1 tablet (650 mg total) by mouth 2 (two) times daily. 60 tablet 1  . tamsulosin (FLOMAX) 0.4 MG CAPS capsule Take 1 capsule (0.4 mg total) by mouth daily after supper. 30 capsule 1  . traMADol (ULTRAM) 50 MG tablet Take 1 tablet by mouth every 6 (six) hours as needed (pain). For 10 days  1  . MECLIZINE HCL PO Take 5 mg by mouth daily as needed (vertigo).     No current facility-administered medications for this visit.    Functional Status:   In your present state of health, do you have any difficulty performing the following activities: 02/19/2015 08/21/2014  Hearing? Y N  Vision? N N  Difficulty concentrating or making decisions? Y N  Walking or climbing stairs?  Y Y  Dressing or bathing? N N  Doing errands, shopping? N N  Preparing Food and eating ? N -  Using the Toilet? N -  In the past six months, have you accidently leaked urine? N -  Do you have problems with loss of bowel control? N -  Managing your Medications? N -  Managing your Finances? Y -  Housekeeping or managing your Housekeeping? Y -    Fall/Depression Screening:    PHQ 2/9 Scores 02/19/2015  PHQ - 2 Score 1    Assessment:  Diabetes improved control                          CKD - pending fistula placement  Plan: Requested pt to check his glucose in the  afternoon to get an idea what his nfbs levels.           Discussed upcoming procedure, pt is well informed.           I will see him in another month.  [facility] CM Care Plan Problem One        Patient Outreach from 04/22/2015 in [place] Problem One  Pt glucose levels are elevated.   Care Plan for Problem One  Active   THN Long Term Goal (31-90 days)  Pt Hgb A1C will be reduced to at or below 7.0 over the next 90 days.   THN Long Term Goal Start Date  02/25/15   Interventions for Problem One Long Term Goal  Pt saw Dr. Buddy Duty and supported Dr. Delman Kitten diabetic regimen. Pt praised for following instructions. Request checking of afternoon gluclose levels.   THN CM Short Term Goal #1 (0-30 days)  Pt will review Emmi programs on diabetes control over the next 30 days.   THN CM Short Term Goal #1 Start Date  02/25/15   THN CM Short Term Goal #1 Met Date  04/22/15   THN CM Short Term Goal #2 (0-30 days)  Pt will check his pm glucose levels several times a week over the next 30 days.   Interventions for Short Term Goal #2  Requested pm glucose check before pm meal.    THN CM Care Plan Problem Two        Patient Outreach from 04/22/2015 in [place] Problem Two  Pt has uncontrolled neuropathic pain.   Care Plan for Problem Two  Active   Interventions for Problem Two Long Term Goal    Encouraged pt to take his tramadol if pain level  is intolerable with only    THN Long Term Goal (31-90) days  Pt pain will be reduced from current level of 8 to below 5 over the next 90 days.   THN Long Term Goal Start Date  02/25/15   THN CM Short Term Goal #1 (0-30 days)  Pt saw nepthfologist.   THN CM Short Term Goal #1 Start Date  02/25/15   [facility] CM Short Term Goal #1 Met Date   03/12/15     Deloria Lair [place] [facility] [phone]

## 2015-04-24 ENCOUNTER — Encounter: Payer: Self-pay | Admitting: Internal Medicine

## 2015-04-29 ENCOUNTER — Encounter (HOSPITAL_COMMUNITY): Payer: Self-pay | Admitting: *Deleted

## 2015-04-30 MED ORDER — SODIUM CHLORIDE 0.9 % IV SOLN
INTRAVENOUS | Status: DC
  Administered 2015-05-01: 08:00:00 via INTRAVENOUS

## 2015-04-30 MED ORDER — CHLORHEXIDINE GLUCONATE CLOTH 2 % EX PADS: 6.0000 | MEDICATED_PAD | Freq: Once | CUTANEOUS | Status: DC

## 2015-04-30 MED ORDER — DEXTROSE 5 % IV SOLN
1.5000 g | INTRAVENOUS | Status: AC
  Administered 2015-05-01: 1.5 g via INTRAVENOUS
  Filled 2015-04-30: qty 1.5

## 2015-04-30 NOTE — Progress Notes (Signed)
Analyze ST Final Research Visit- Pt seen in device clinic. Device checked by industry and research. No ST alerts. Research ST thresholds turned off per protocol. Pt released from research study [place] and transferred to [facility] for ongoing remote monitoring. EKG WNL reviewed by Amber, NP. BP 122/70 HR 58.

## 2015-04-30 NOTE — Progress Notes (Signed)
Sandy from [facility] was notified of patients surgery tomorrow and that it may interfere with device function.

## 2015-05-01 ENCOUNTER — Encounter (HOSPITAL_COMMUNITY): Payer: Self-pay

## 2015-05-01 ENCOUNTER — Ambulatory Visit (HOSPITAL_COMMUNITY): Payer: Medicare Other | Admitting: Anesthesiology

## 2015-05-01 ENCOUNTER — Ambulatory Visit (HOSPITAL_COMMUNITY)
Admission: RE | Admit: 2015-05-01 | Discharge: 2015-05-01 | Disposition: A | Discharge status: Home / Self Care | Payer: Medicare Other | Source: Ambulatory Visit | Attending: Vascular Surgery | Admitting: Vascular Surgery

## 2015-05-01 ENCOUNTER — Encounter (HOSPITAL_COMMUNITY)
Admission: RE | Disposition: A | Discharge status: Home / Self Care | Payer: Self-pay | Source: Ambulatory Visit | Attending: Vascular Surgery

## 2015-05-01 DIAGNOSIS — N186 End stage renal disease: Secondary | ICD-10-CM | POA: Insufficient documentation

## 2015-05-01 DIAGNOSIS — Z79899 Other long term (current) drug therapy: Secondary | ICD-10-CM | POA: Insufficient documentation

## 2015-05-01 DIAGNOSIS — M1990 Unspecified osteoarthritis, unspecified site: Secondary | ICD-10-CM | POA: Diagnosis not present

## 2015-05-01 DIAGNOSIS — Z87891 Personal history of nicotine dependence: Secondary | ICD-10-CM | POA: Diagnosis not present

## 2015-05-01 DIAGNOSIS — I252 Old myocardial infarction: Secondary | ICD-10-CM | POA: Insufficient documentation

## 2015-05-01 DIAGNOSIS — D649 Anemia, unspecified: Secondary | ICD-10-CM | POA: Diagnosis not present

## 2015-05-01 DIAGNOSIS — I2510 Atherosclerotic heart disease of native coronary artery without angina pectoris: Secondary | ICD-10-CM | POA: Insufficient documentation

## 2015-05-01 DIAGNOSIS — G250 Essential tremor: Secondary | ICD-10-CM | POA: Insufficient documentation

## 2015-05-01 DIAGNOSIS — Z951 Presence of aortocoronary bypass graft: Secondary | ICD-10-CM | POA: Diagnosis not present

## 2015-05-01 DIAGNOSIS — Z95810 Presence of automatic (implantable) cardiac defibrillator: Secondary | ICD-10-CM | POA: Insufficient documentation

## 2015-05-01 DIAGNOSIS — Z794 Long term (current) use of insulin: Secondary | ICD-10-CM | POA: Insufficient documentation

## 2015-05-01 DIAGNOSIS — E1122 Type 2 diabetes mellitus with diabetic chronic kidney disease: Secondary | ICD-10-CM | POA: Diagnosis not present

## 2015-05-01 DIAGNOSIS — J449 Chronic obstructive pulmonary disease, unspecified: Secondary | ICD-10-CM | POA: Insufficient documentation

## 2015-05-01 DIAGNOSIS — I509 Heart failure, unspecified: Secondary | ICD-10-CM | POA: Diagnosis not present

## 2015-05-01 DIAGNOSIS — I120 Hypertensive chronic kidney disease with stage 5 chronic kidney disease or end stage renal disease: Secondary | ICD-10-CM | POA: Diagnosis not present

## 2015-05-01 DIAGNOSIS — Z7982 Long term (current) use of aspirin: Secondary | ICD-10-CM | POA: Insufficient documentation

## 2015-05-01 DIAGNOSIS — E785 Hyperlipidemia, unspecified: Secondary | ICD-10-CM | POA: Diagnosis not present

## 2015-05-01 DIAGNOSIS — I255 Ischemic cardiomyopathy: Secondary | ICD-10-CM | POA: Insufficient documentation

## 2015-05-01 DIAGNOSIS — K219 Gastro-esophageal reflux disease without esophagitis: Secondary | ICD-10-CM | POA: Insufficient documentation

## 2015-05-01 HISTORY — DX: Other specified health status: Z78.9

## 2015-05-01 HISTORY — DX: Heart failure, unspecified: I50.9

## 2015-05-01 HISTORY — DX: Presence of cardiac pacemaker: Z95.0

## 2015-05-01 HISTORY — DX: Chronic kidney disease, unspecified: N18.9

## 2015-05-01 HISTORY — DX: Presence of automatic (implantable) cardiac defibrillator: Z95.810

## 2015-05-01 HISTORY — DX: Unspecified hearing loss, unspecified ear: H91.90

## 2015-05-01 HISTORY — DX: Anemia, unspecified: D64.9

## 2015-05-01 HISTORY — DX: Personal history of other medical treatment: Z92.89

## 2015-05-01 HISTORY — PX: AV FISTULA PLACEMENT: SHX1204

## 2015-05-01 HISTORY — DX: Unspecified dementia, unspecified severity, without behavioral disturbance, psychotic disturbance, mood disturbance, and anxiety: F03.90

## 2015-05-01 HISTORY — DX: Hypothyroidism, unspecified: E03.9

## 2015-05-01 LAB — POCT I-STAT 4, (NA,K, GLUC, HGB,HCT)
Glucose, Bld: 185 mg/dL — ABNORMAL HIGH (ref 65–99)
HCT: 31 % — ABNORMAL LOW (ref 39.0–52.0)
Hemoglobin: 10.5 g/dL — ABNORMAL LOW (ref 13.0–17.0)
Potassium: 4.2 mmol/L (ref 3.5–5.1)
Sodium: 142 mmol/L (ref 135–145)

## 2015-05-01 LAB — GLUCOSE, CAPILLARY
GLUCOSE-CAPILLARY: 176 mg/dL — AB (ref 65–99)
GLUCOSE-CAPILLARY: 174 mg/dL — AB (ref 65–99)

## 2015-05-01 SURGERY — ARTERIOVENOUS (AV) FISTULA CREATION
Anesthesia: Monitor Anesthesia Care | Site: Arm Lower | Laterality: Right

## 2015-05-01 MED ORDER — GLYCOPYRROLATE 0.2 MG/ML IJ SOLN
INTRAMUSCULAR | Status: AC
  Filled 2015-05-01: qty 1

## 2015-05-01 MED ORDER — 0.9 % SODIUM CHLORIDE (POUR BTL) OPTIME
TOPICAL | Status: DC | PRN
  Administered 2015-05-01: 1000 mL

## 2015-05-01 MED ORDER — HEPARIN SODIUM (PORCINE) 1000 UNIT/ML IJ SOLN
INTRAMUSCULAR | Status: AC
  Filled 2015-05-01: qty 1

## 2015-05-01 MED ORDER — MIDAZOLAM HCL 2 MG/2ML IJ SOLN
INTRAMUSCULAR | Status: AC
  Filled 2015-05-01: qty 4

## 2015-05-01 MED ORDER — ROCURONIUM BROMIDE 50 MG/5ML IV SOLN
INTRAVENOUS | Status: AC
  Filled 2015-05-01: qty 1

## 2015-05-01 MED ORDER — THROMBIN 20000 UNITS EX SOLR
CUTANEOUS | Status: AC
  Filled 2015-05-01: qty 20000

## 2015-05-01 MED ORDER — FENTANYL CITRATE (PF) 100 MCG/2ML IJ SOLN
INTRAMUSCULAR | Status: DC | PRN
  Administered 2015-05-01: 25 ug via INTRAVENOUS

## 2015-05-01 MED ORDER — LIDOCAINE HCL (CARDIAC) 20 MG/ML IV SOLN
INTRAVENOUS | Status: DC | PRN
  Administered 2015-05-01: 40 mg via INTRAVENOUS

## 2015-05-01 MED ORDER — LIDOCAINE HCL (CARDIAC) 20 MG/ML IV SOLN
INTRAVENOUS | Status: AC
  Filled 2015-05-01: qty 5

## 2015-05-01 MED ORDER — PHENYLEPHRINE 40 MCG/ML (10ML) SYRINGE FOR IV PUSH (FOR BLOOD PRESSURE SUPPORT)
PREFILLED_SYRINGE | INTRAVENOUS | Status: AC
  Filled 2015-05-01: qty 10

## 2015-05-01 MED ORDER — FENTANYL CITRATE (PF) 100 MCG/2ML IJ SOLN: 25.0000 ug | INTRAMUSCULAR | Status: DC | PRN

## 2015-05-01 MED ORDER — PROPOFOL INFUSION 10 MG/ML OPTIME
INTRAVENOUS | Status: DC | PRN
  Administered 2015-05-01: 50 ug/kg/min via INTRAVENOUS

## 2015-05-01 MED ORDER — EPHEDRINE SULFATE 50 MG/ML IJ SOLN
INTRAMUSCULAR | Status: DC | PRN
  Administered 2015-05-01 (×2): 5 mg via INTRAVENOUS

## 2015-05-01 MED ORDER — ONDANSETRON HCL 4 MG/2ML IJ SOLN
INTRAMUSCULAR | Status: AC
  Filled 2015-05-01: qty 2

## 2015-05-01 MED ORDER — ONDANSETRON HCL 4 MG/2ML IJ SOLN
INTRAMUSCULAR | Status: DC | PRN
  Administered 2015-05-01: 4 mg via INTRAVENOUS

## 2015-05-01 MED ORDER — SODIUM CHLORIDE 0.9 % IR SOLN
Status: DC | PRN
  Administered 2015-05-01: 500 mL

## 2015-05-01 MED ORDER — LIDOCAINE HCL (PF) 1 % IJ SOLN
INTRAMUSCULAR | Status: DC | PRN
  Administered 2015-05-01: 8 mL

## 2015-05-01 MED ORDER — HEPARIN SODIUM (PORCINE) 1000 UNIT/ML IJ SOLN
INTRAMUSCULAR | Status: DC | PRN
  Administered 2015-05-01: 5000 [IU] via INTRAVENOUS

## 2015-05-01 MED ORDER — MIDAZOLAM HCL 5 MG/5ML IJ SOLN
INTRAMUSCULAR | Status: DC | PRN
  Administered 2015-05-01: 1 mg via INTRAVENOUS

## 2015-05-01 MED ORDER — LIDOCAINE HCL (PF) 1 % IJ SOLN
INTRAMUSCULAR | Status: AC
  Filled 2015-05-01: qty 30

## 2015-05-01 MED ORDER — PROPOFOL 10 MG/ML IV BOLUS
INTRAVENOUS | Status: AC
  Filled 2015-05-01: qty 20

## 2015-05-01 MED ORDER — OXYCODONE-ACETAMINOPHEN 5-325 MG PO TABS: 1.0000 | ORAL_TABLET | Freq: Four times a day (QID) | ORAL | Status: DC | PRN

## 2015-05-01 MED ORDER — FENTANYL CITRATE (PF) 250 MCG/5ML IJ SOLN
INTRAMUSCULAR | Status: AC
  Filled 2015-05-01: qty 5

## 2015-05-01 MED ORDER — SUCCINYLCHOLINE CHLORIDE 20 MG/ML IJ SOLN
INTRAMUSCULAR | Status: AC
  Filled 2015-05-01: qty 1

## 2015-05-01 MED ORDER — EPHEDRINE SULFATE 50 MG/ML IJ SOLN
INTRAMUSCULAR | Status: AC
  Filled 2015-05-01: qty 1

## 2015-05-01 MED ORDER — SODIUM CHLORIDE 0.9 % IJ SOLN
INTRAMUSCULAR | Status: AC
  Filled 2015-05-01: qty 10

## 2015-05-01 SURGICAL SUPPLY — 32 items
ARMBAND PINK RESTRICT EXTREMIT (MISCELLANEOUS) ×2 IMPLANT
CANISTER SUCTION 2500CC (MISCELLANEOUS) ×2 IMPLANT
CANNULA VESSEL 3MM 2 BLNT TIP (CANNULA) ×2 IMPLANT
CLIP TI MEDIUM 6 (CLIP) ×2 IMPLANT
CLIP TI WIDE RED SMALL 6 (CLIP) ×2 IMPLANT
COVER PROBE W GEL 5X96 (DRAPES) IMPLANT
DECANTER SPIKE VIAL GLASS SM (MISCELLANEOUS) ×2 IMPLANT
DRAIN PENROSE 1/4X12 LTX STRL (WOUND CARE) ×4 IMPLANT
ELECT REM PT RETURN 9FT ADLT (ELECTROSURGICAL) ×2
ELECTRODE REM PT RTRN 9FT ADLT (ELECTROSURGICAL) ×1 IMPLANT
GLOVE BIO SURGEON STRL SZ 6.5 (GLOVE) ×4 IMPLANT
GLOVE BIO SURGEON STRL SZ7.5 (GLOVE) ×2 IMPLANT
GLOVE BIOGEL PI IND STRL 6.5 (GLOVE) ×3 IMPLANT
GLOVE BIOGEL PI INDICATOR 6.5 (GLOVE) ×3
GOWN STRL REUS W/ TWL LRG LVL3 (GOWN DISPOSABLE) ×3 IMPLANT
GOWN STRL REUS W/TWL LRG LVL3 (GOWN DISPOSABLE) ×3
KIT BASIN OR (CUSTOM PROCEDURE TRAY) ×2 IMPLANT
KIT ROOM TURNOVER OR (KITS) ×2 IMPLANT
LIQUID BAND (GAUZE/BANDAGES/DRESSINGS) ×2 IMPLANT
LOOP VESSEL MINI RED (MISCELLANEOUS) IMPLANT
NS IRRIG 1000ML POUR BTL (IV SOLUTION) ×2 IMPLANT
PACK CV ACCESS (CUSTOM PROCEDURE TRAY) ×2 IMPLANT
PAD ARMBOARD 7.5X6 YLW CONV (MISCELLANEOUS) ×4 IMPLANT
SPONGE SURGIFOAM ABS GEL 100 (HEMOSTASIS) IMPLANT
SUT PROLENE 6 0 BV (SUTURE) IMPLANT
SUT PROLENE 7 0 BV 1 (SUTURE) ×2 IMPLANT
SUT VIC AB 3-0 SH 27 (SUTURE) ×1
SUT VIC AB 3-0 SH 27X BRD (SUTURE) ×1 IMPLANT
SUT VICRYL 4-0 PS2 18IN ABS (SUTURE) ×2 IMPLANT
TOWEL OR 17X26 10 PK STRL BLUE (TOWEL DISPOSABLE) ×2 IMPLANT
UNDERPAD 30X30 INCONTINENT (UNDERPADS AND DIAPERS) ×2 IMPLANT
WATER STERILE IRR 1000ML POUR (IV SOLUTION) ×2 IMPLANT

## 2015-05-01 NOTE — Progress Notes (Signed)
Sandy with [facility] notified that patient is ready for surgery once his IV is started and Dr. Oneida Alar arrives.  She will notify Magda Paganini to come on over for his ICD.

## 2015-05-01 NOTE — H&P (View-Only) (Signed)
[facility] [place] HISTORY AND PHYSICAL   History of Present Illness:  Patient is a 81 y.o.  male who presents for placement of a permanent hemodialysis access. The patient is right handed .  The patient is not currently on hemodialysis.  The cause of renal failure is thought to be secondary to diabetes and hypertension.  He also has a left-sided AICD. Other chronic medical problems include hyperlipidemia, coronary disease, reflux.  Past Medical History  Diagnosis Date  . Diabetes mellitus     dr meyers  . Hyperlipidemia   . Hypertension   . Coronary artery disease     s/p CABG 1997  . GERD (gastroesophageal reflux disease)   . Arthritis   . Allergic rhinitis   . Hyperplastic colon polyp   . Diverticulitis, colon   . Meniere's syndrome   . Tremor, essential     dr willis  . COPD, mild     dr clance  . MI (myocardial infarction) 1997, 2005  . Ischemic cardiomyopathy     sp ICD (SJM) implanted by Dr Allred    Past Surgical History  Procedure Laterality Date  . Coronary artery bypass graft  1997    [place]  . Cardiac catheterization  2005    medical therapy  . Cardiac defibrillator placement      ICD implanted by Dr Allred, Analyze ST study patient  . Esophagogastroduodenoscopy N/A 08/25/2014    Procedure: ESOPHAGOGASTRODUODENOSCOPY (EGD);  Surgeon: Vincent C. Schooler, MD;  Location: [facility];  Service: Endoscopy;  Laterality: N/A;  . Colonoscopy N/A 08/25/2014    Procedure: COLONOSCOPY;  Surgeon: Vincent C. Schooler, MD;  Location: [facility];  Service: Endoscopy;  Laterality: N/A;     Social History Social History  Substance Use Topics  . Smoking status: Former Smoker -- 35 years    Types: Cigarettes    Quit date: 09/01/1995  . Smokeless tobacco: None  . Alcohol Use: 0.0 oz/week     Comment: occasional scotch    Family History Family History  Problem Relation Age of Onset  . Parkinsonism Father   . Stomach cancer Mother   . Diabetes  Mother     Allergies  Allergies  Allergen Reactions  . Metformin And Related Other (See Comments)    GI sensitivity with high doses  . Ramipril Other (See Comments)    Cough     Current Outpatient Prescriptions  Medication Sig Dispense Refill  . Acetaminophen (ARTHRITIS PAIN RELIEF PO) Take 650 mg by mouth 2 (two) times daily.     . aspirin EC 81 MG tablet Take 81 mg by mouth daily.    . bimatoprost (LUMIGAN) 0.03 % ophthalmic solution Place 1 drop into both eyes at bedtime.     . calcitRIOL (ROCALTROL) 0.25 MCG capsule Take 0.25 mcg by mouth daily.    . calcium-vitamin D (OSCAL WITH D) 500-200 MG-UNIT per tablet Take 1 tablet by mouth every morning.    . cetirizine (ZYRTEC) 10 MG tablet Take 5 mg by mouth daily.      . cholecalciferol (VITAMIN D) 1000 UNITS tablet Take 1,000 Units by mouth daily.    . donepezil (ARICEPT) 10 MG tablet Take 10 mg by mouth at bedtime.    . fish oil-omega-3 fatty acids 1000 MG capsule Take 2 g by mouth 2 (two) times daily.     . furosemide (LASIX) 20 MG tablet TAKE 1 TABLET EVERY OTHER DAY ORALLY FOR 30  0  . gabapentin (NEURONTIN)   300 MG capsule Take 300 mg by mouth 3 (three) times daily.    . insulin detemir (LEVEMIR) 100 UNIT/ML injection Inject 34 Units into the skin at bedtime.    . lansoprazole (PREVACID) 30 MG capsule Take 30 mg by mouth daily as needed (for acid reflex).     . levothyroxine (SYNTHROID, LEVOTHROID) 50 MCG tablet Take 50 mcg by mouth daily before breakfast.    . MECLIZINE HCL PO Take 5 mg by mouth daily as needed (vertigo).    . ONE TOUCH ULTRA TEST test strip TEST 1 TIME DAILY.  3  . simvastatin (ZOCOR) 40 MG tablet Take 40 mg by mouth at bedtime.      . sitaGLIPtin (JANUVIA) 50 MG tablet Take 25 mg by mouth daily with breakfast.     . sodium bicarbonate 650 MG tablet Take 1 tablet (650 mg total) by mouth 2 (two) times daily. 60 tablet 1  . tamsulosin (FLOMAX) 0.4 MG CAPS capsule Take 1 capsule (0.4 mg total) by mouth daily  after supper. 30 capsule 1  . traMADol (ULTRAM) 50 MG tablet Take 1 tablet by mouth every 6 (six) hours as needed (pain). For 10 days  1  . glimepiride (AMARYL) 4 MG tablet Take 4 mg by mouth 2 (two) times daily.       No current facility-administered medications for this visit.    ROS:   General:  No weight loss, Fever, chills  HEENT: No recent headaches, no nasal bleeding, no visual changes, no sore throat  Neurologic: No dizziness, blackouts, seizures. No recent symptoms of stroke or mini- stroke. No recent episodes of slurred speech, or temporary blindness.  Cardiac: No recent episodes of chest pain/pressure, no shortness of breath at rest.  No shortness of breath with exertion.  Denies history of atrial fibrillation or irregular heartbeat  Vascular: No history of rest pain in feet.  No history of claudication.  No history of non-healing ulcer, No history of DVT   Pulmonary: No home oxygen, no productive cough, no hemoptysis,  No asthma or wheezing  Musculoskeletal:  [ ] Arthritis, [ ] Low back pain,  [ ] Joint pain  Hematologic:No history of hypercoagulable state.  No history of easy bleeding.  No history of anemia  Gastrointestinal: No hematochezia or melena,  No gastroesophageal reflux, no trouble swallowing  Urinary: [ ] chronic Kidney disease, [ ] on HD - [ ] MWF or [ ] TTHS, [ ] Burning with urination, [ ] Frequent urination, [ ] Difficulty urinating;   Skin: No rashes  Psychological: No history of anxiety,  No history of depression   Physical Examination  Filed Vitals:   04/18/15 1230  BP: 140/62  Pulse: 60  Temp: 97.5 F (36.4 C)  TempSrc: Oral  Resp: 14  Height: 5' 10.5" (1.791 m)  Weight: 190 lb (86.183 kg)  SpO2: 98%    Body mass index is 26.87 kg/(m^2).  General:  Alert and oriented, no acute distress HEENT: Normal Neck: No bruit or JVD Pulmonary: Clear to auscultation bilaterally Cardiac: Regular Rate and Rhythm without murmur, left-sided AICD  palpable Gastrointestinal: Soft, non-tender, non-distended, no mass Skin: No rash Extremity Pulses:  2+ radial, brachial pulses bilaterally Musculoskeletal: No deformity or edema  Neurologic: Upper and lower extremity motor 5/5 and symmetric  DATA: Patient had an upper extremity vein mapping as well as upper extremity arterial duplex exam today. I reviewed and interpreted both the studies. Radial artery was 3 mm on the right 2   mm on the left brachial artery was 5 mm bilaterally. The cephalic vein on the right side is of reasonable quality in the forearm 2-1/2-3 mm diameter. The basilic vein is 3-4 mm in the right arm 3-4 mm in the left arm the cephalic vein in the left arm is small in the upper arm and 3 mm in the forearm   ASSESSMENT: Since patient has a left-sided AICD ablated best access placement will be on the right side we'll avoid possible venous hypertension. In light of that he does seem to have a reasonable forearm cephalic vein. I believe the best first option for him would be a right radiocephalic AV fistula. Risks benefits possible complications and procedure details were explained to the patient and his family today. These include but are not limited to nonestrogen the fistula 15-20%, bleeding, infection, numbness around the incision or in the hand. He understands and agrees to proceed. This will be scheduled for 05/01/2015.   PLAN:  See above  Charles Fields, MD [facility] [place] Office: [phone] Pager: [phone]   

## 2015-05-01 NOTE — Op Note (Signed)
Procedure: Right Radial Cephalic AV fistula   Preop: ESRD   Postop: ESRD   Anesthesia: MAC with local   Assistant:  Silva Bandy, PA-c   Findings: 3 mm cephalic vein   3 mm radial artery   Procedure Details:  The left upper extremity was prepped and draped in usual sterile fashion. Local anesthesia was infiltrated midway between the cephalic and radial artery anatomically. Ultrasound was used to identify the location of the vein. A longitudinal skin incision was then made in this location at the distal left forearm. The incision was carried into the subcutaneous tissues down to level cephalic vein. The vein had some spasm but was overall reasonable quality accepting a 3 mm dilator. The vein was dissected free circumferentially and small side branches ligated and divided between silk ties. This was gently distended with heparinized saline, spatulated, and marked for orientation. Next the radial artery was dissected free in the medial portion incision. The artery was 3 mm in diameter but had a reasonable pulse. The vessel loops were placed proximal and distal to the planned site of arteriotomy. The patient was given 5000 units of intravenous heparin. After appropriate circulation time, the vessel loops were used to control the artery. A longitudinal opening was made in the left radial artery. The vein was controlled proximally with a fine bulldog clamp. The vein was then swung over to the artery and sewn end of vein to side of artery using a running 7-0 Prolene suture. Just prior to completion, the anastomosis was fore bled back bled and thoroughly flushed. The anastomosis was secured, vessel loops released, and there was a palpable thrill in the fistula immediately. After hemostasis was obtained, the subcutaneous tissues were reapproximated using a running 3-0 Vicryl suture. The skin was then closed with a 4 0 Vicryl subcuticular stitch. Dermabond was applied to the skin incision. The patient tolerated the  procedure well and there were no complications. Instrument sponge and needle counts were correct at the end of the case. The patient was taken to PACU in stable condition.   Ruta Hinds, MD  [facility]  Office: [phone]  Pager: [phone]

## 2015-05-01 NOTE — Interval H&P Note (Signed)
History and Physical Interval Note:  05/01/2015 8:26 AM  Patrick Boyle  has presented today for surgery, with the diagnosis of End Stage Renal Disease N18.6  The various methods of treatment have been discussed with the patient and family. After consideration of risks, benefits and other options for treatment, the patient has consented to  Procedure(s): ARTERIOVENOUS (AV) FISTULA CREATION (Right) as a surgical intervention .  The patient's history has been reviewed, patient examined, no change in status, stable for surgery.  I have reviewed the patient's chart and labs.  Questions were answered to the patient's satisfaction.     Ruta Hinds

## 2015-05-01 NOTE — Transfer of Care (Signed)
Immediate Anesthesia Transfer of Care Note  Patient: Patrick Boyle  Procedure(s) Performed: Procedure(s): RIGHT RADIOCEPHALIC ARTERIOVENOUS (AV) FISTULA CREATION (Right)  Patient Location: PACU  Anesthesia Type:MAC  Level of Consciousness: awake, alert , oriented and patient cooperative  Airway & Oxygen Therapy: Patient Spontanous Breathing  Post-op Assessment: Report given to RN and Post -op Vital signs reviewed and stable  Post vital signs: Reviewed  Last Vitals:  Filed Vitals:   05/01/15 0651  BP: 139/52  Pulse: 53  Temp: 36.4 C  Resp: 18    Complications: No apparent anesthesia complications

## 2015-05-01 NOTE — Anesthesia Postprocedure Evaluation (Signed)
  Anesthesia Post-op Note  Patient: Patrick Boyle  Procedure(s) Performed: Procedure(s): RIGHT RADIOCEPHALIC ARTERIOVENOUS (AV) FISTULA CREATION (Right)  Patient Location: PACU  Anesthesia Type: MAC  Level of Consciousness: awake and alert   Airway and Oxygen Therapy: Patient Spontanous Breathing  Post-op Pain: Controlled  Post-op Assessment: Post-op Vital signs reviewed, Patient's Cardiovascular Status Stable and Respiratory Function Stable  Post-op Vital Signs: Reviewed  Filed Vitals:   05/01/15 1000  BP: 127/46  Pulse:   Temp: 36.1 C  Resp:     Complications: No apparent anesthesia complications

## 2015-05-01 NOTE — Anesthesia Preprocedure Evaluation (Addendum)
Anesthesia Evaluation  Patient identified by MRN, date of birth, ID band Patient awake    Reviewed: Allergy & Precautions, H&P , NPO status , Patient's Chart, lab work & pertinent test results  History of Anesthesia Complications Negative for: history of anesthetic complications  Airway Mallampati: II  TM Distance: >3 FB Neck ROM: Full    Dental no notable dental hx. (+) Teeth Intact, Dental Advisory Given   Pulmonary COPDformer smoker,  breath sounds clear to auscultation  Pulmonary exam normal       Cardiovascular hypertension, Pt. on medications + CAD, + Past MI, + CABG and +CHF + pacemaker + Cardiac Defibrillator Rhythm:Regular Rate:Normal     Neuro/Psych negative neurological ROS  negative psych ROS   GI/Hepatic Neg liver ROS, GERD-  Medicated and Controlled,  Endo/Other  diabetes, Type 2, Oral Hypoglycemic AgentsHypothyroidism   Renal/GU ESRF and DialysisRenal disease  negative genitourinary   Musculoskeletal  (+) Arthritis -, Osteoarthritis,    Abdominal   Peds  Hematology negative hematology ROS (+) anemia ,   Anesthesia Other Findings   Reproductive/Obstetrics negative OB ROS                           Anesthesia Physical Anesthesia Plan  ASA: III  Anesthesia Plan: MAC   Post-op Pain Management:    Induction: Intravenous  Airway Management Planned: Simple Face Mask  Additional Equipment:   Intra-op Plan:   Post-operative Plan:   Informed Consent: I have reviewed the patients History and Physical, chart, labs and discussed the procedure including the risks, benefits and alternatives for the proposed anesthesia with the patient or authorized representative who has indicated his/her understanding and acceptance.   Dental advisory given  Plan Discussed with: CRNA  Anesthesia Plan Comments:         Anesthesia Quick Evaluation

## 2015-05-01 NOTE — Anesthesia Procedure Notes (Signed)
Procedure Name: MAC Date/Time: 05/01/2015 8:40 AM Performed by: Jenne Campus Pre-anesthesia Checklist: Patient identified, Emergency Drugs available, Suction available, Timeout performed and Patient being monitored Patient Re-evaluated:Patient Re-evaluated prior to inductionOxygen Delivery Method: Simple face mask

## 2015-05-02 ENCOUNTER — Encounter (HOSPITAL_COMMUNITY): Payer: Self-pay | Admitting: Vascular Surgery

## 2015-05-02 ENCOUNTER — Other Ambulatory Visit: Payer: Self-pay | Admitting: *Deleted

## 2015-05-02 DIAGNOSIS — Z4931 Encounter for adequacy testing for hemodialysis: Secondary | ICD-10-CM

## 2015-05-02 DIAGNOSIS — N186 End stage renal disease: Secondary | ICD-10-CM

## 2015-05-03 ENCOUNTER — Other Ambulatory Visit: Payer: Self-pay | Admitting: *Deleted

## 2015-05-03 ENCOUNTER — Telehealth: Payer: Self-pay | Admitting: Vascular Surgery

## 2015-05-03 ENCOUNTER — Telehealth: Payer: Self-pay | Admitting: *Deleted

## 2015-05-03 DIAGNOSIS — T8140XA Infection following a procedure, unspecified, initial encounter: Secondary | ICD-10-CM

## 2015-05-03 MED ORDER — CIPROFLOXACIN HCL 250 MG PO TABS: 250.0000 mg | ORAL_TABLET | Freq: Two times a day (BID) | ORAL | Status: DC

## 2015-05-03 NOTE — Telephone Encounter (Signed)
Patrick Boyle called in to triage line to report that he has "redness that extends approximately 2 inches from his incision on the top and bottom of his wrist"  (S/p right radiocephalic AVF on 8-55-01 by Dr. Oneida Alar). He reports no drainage, no numbness or tingling in his hand and he is able to grasp objects with his hand. He is very concerned about this redness because his is a diabetic and fears infection. He has contacted his [place] and she wanted him to contact us regarding getting an antibiotic. Cipro 250 mg BID x 7 days authorized per our nurse practioner.

## 2015-05-03 NOTE — Telephone Encounter (Addendum)
-----   Message from Mena Goes, RN sent at 05/02/2015 11:36 AM EDT ----- Regarding: Schedule   ----- Message -----    From: Dario Ave    Sent: 05/02/2015  11:31 AM      To: [facility] Charge Pool Subject: Kay's log                                        ----- Message -----    From: Alvia Grove, PA-C    Sent: 05/01/2015   9:46 AM      To: Vvs Charge Pool  S/p right radiocephalic AVF 0/17/51  F/u with Dr. Oneida Alar in 6 weeks with duplex  Thanks Maudie Mercury  notified patient of post op appt. on 06-13-15 at 4 pm; then 06-2015 at 10:45 am with dr. Oneida Alar

## 2015-05-07 ENCOUNTER — Other Ambulatory Visit: Payer: Self-pay | Admitting: *Deleted

## 2015-05-07 ENCOUNTER — Encounter: Payer: Self-pay | Admitting: *Deleted

## 2015-05-07 DIAGNOSIS — N2581 Secondary hyperparathyroidism of renal origin: Secondary | ICD-10-CM | POA: Diagnosis not present

## 2015-05-07 DIAGNOSIS — E039 Hypothyroidism, unspecified: Secondary | ICD-10-CM | POA: Diagnosis not present

## 2015-05-07 DIAGNOSIS — E119 Type 2 diabetes mellitus without complications: Secondary | ICD-10-CM | POA: Diagnosis not present

## 2015-05-07 DIAGNOSIS — D631 Anemia in chronic kidney disease: Secondary | ICD-10-CM | POA: Diagnosis not present

## 2015-05-07 DIAGNOSIS — N189 Chronic kidney disease, unspecified: Secondary | ICD-10-CM | POA: Diagnosis not present

## 2015-05-07 DIAGNOSIS — Z23 Encounter for immunization: Secondary | ICD-10-CM | POA: Diagnosis not present

## 2015-05-07 DIAGNOSIS — N184 Chronic kidney disease, stage 4 (severe): Secondary | ICD-10-CM | POA: Diagnosis not present

## 2015-05-07 DIAGNOSIS — N400 Enlarged prostate without lower urinary tract symptoms: Secondary | ICD-10-CM | POA: Diagnosis not present

## 2015-05-07 NOTE — Patient Outreach (Signed)
Telephone assessment: follow up to ensure pt new AV fistula site is without any problems. Wife reports husband is doing well.  They went to see the surgeon today for a follow up and everything is working well and no signs of infection.

## 2015-05-07 NOTE — Patient Outreach (Signed)
[facility]) Care Management  05/07/2015  Patrick Boyle [birth year] [id number]  Pt wife called me to ask if I could stop by to check on Patrick Boyle's surgical site where he had his graft put in for dialysis. Patrick Boyle says the area is very red. She asked if she should also call the surgeon and I told her yes she could. I told her I would be out when I left the office.  Patrick Boyle arm looks very well. The redness that they were worried about is just superficial bleeding under the skin (ecchymosis) from the surgery. The area is not warm to touch and there is no drainage.  I advised that he does not need to take the antibiotic. I urged them to call me if any other problem come up over the weekend.   I will call him on Monday to check in.  Patrick Boyle [facility] [place] [phone]

## 2015-05-13 ENCOUNTER — Telehealth: Payer: Self-pay | Admitting: *Deleted

## 2015-05-13 NOTE — Telephone Encounter (Signed)
Patient called to report a stinging sensation under his incision ( Right radiocephalic AVF on 0-23-34 by Dr. Oneida Alar). He reports that he has no discharge, drainage or erythema from his incision. He is afebrile and he states that he is having no pain when using his hand to grasp objects. The rest of his arm is not swollen or painful. I discussed that this stinging could be related to nerve healing in the incisional area and I gave him information on precautions, wound healing, wound cleansing, and S&S of infection and steal syndrome. He voiced understanding and agreement. He is scheduled to see Dr. Oneida Alar on 06-20-15 ( AVF duplex is schedule for 06-13-15).

## 2015-05-14 ENCOUNTER — Other Ambulatory Visit: Payer: Self-pay | Admitting: *Deleted

## 2015-05-14 NOTE — Patient Outreach (Signed)
[facility]) Care Management   05/14/2015  Patrick Boyle October 13, 1933 [id number]  Patrick Boyle is an 81 y.o. male  Subjective:  Doing well. Graft site has healed well. Pt is having some burning sensations and pain in his thumb. He is taking tramadol prn. His GFR has improved. His HgbA1C has come down from 8.9 to 8.4 in a 3 week period.   Objective:  BP 112/48 mmHg  Pulse 53  Resp 18  Wt 185 lb (83.915 kg)  SpO2 98% FBS 89  Review of Systems  Constitutional: Negative.   HENT: Negative.   Eyes: Negative.   Respiratory: Negative.   Cardiovascular: Negative.   Gastrointestinal: Positive for diarrhea.       2 diarrhea stools after eating creamed corn last night. None during the day today. Pt did take immodium last night. Last normal BM was 2 days ago.   Skin: Negative.     Physical Exam  Current Medications:   Current Outpatient Prescriptions  Medication Sig Dispense Refill  . Acetaminophen (ARTHRITIS PAIN RELIEF PO) Take 650 mg by mouth 2 (two) times daily.     Marland Kitchen aspirin EC 81 MG tablet Take 81 mg by mouth daily.    . bimatoprost (LUMIGAN) 0.03 % ophthalmic solution Place 1 drop into both eyes at bedtime.     . calcitRIOL (ROCALTROL) 0.25 MCG capsule Take 0.25 mcg by mouth daily.    . cetirizine (ZYRTEC) 10 MG tablet Take 5 mg by mouth daily.      . cholecalciferol (VITAMIN D) 1000 UNITS tablet Take 1,000 Units by mouth daily.    . ciprofloxacin (CIPRO) 250 MG tablet Take 1 tablet (250 mg total) by mouth 2 (two) times daily. 14 tablet 1  . cyanocobalamin 500 MCG tablet Take 500 mcg by mouth daily.    Marland Kitchen donepezil (ARICEPT) 10 MG tablet Take 10 mg by mouth at bedtime.    . fish oil-omega-3 fatty acids 1000 MG capsule Take 2 g by mouth 2 (two) times daily.     . furosemide (LASIX) 20 MG tablet TAKE 1 TABLET EVERY OTHER DAY ORALLY FOR 30  0  . gabapentin (NEURONTIN) 300 MG capsule Take 300 mg by mouth 3 (three) times daily.    . insulin detemir (LEVEMIR) 100  UNIT/ML injection Inject 34 Units into the skin at bedtime.    . lansoprazole (PREVACID) 30 MG capsule Take 30 mg by mouth daily as needed (for acid reflex).     Marland Kitchen levothyroxine (SYNTHROID, LEVOTHROID) 50 MCG tablet Take 50 mcg by mouth daily before breakfast.    . MECLIZINE HCL PO Take 5 mg by mouth daily as needed (vertigo).    . ONE TOUCH ULTRA TEST test strip TEST 1 TIME DAILY.  3  . oxyCODONE-acetaminophen (ROXICET) 5-325 MG per tablet Take 1 tablet by mouth every 6 (six) hours as needed for severe pain. 20 tablet 0  . simvastatin (ZOCOR) 40 MG tablet Take 40 mg by mouth at bedtime.      . sitaGLIPtin (JANUVIA) 50 MG tablet Take 25 mg by mouth daily with breakfast.     . sodium bicarbonate 650 MG tablet Take 1 tablet (650 mg total) by mouth 2 (two) times daily. 60 tablet 1  . tamsulosin (FLOMAX) 0.4 MG CAPS capsule Take 1 capsule (0.4 mg total) by mouth daily after supper. 30 capsule 1  . traMADol (ULTRAM) 50 MG tablet Take 1 tablet by mouth every 6 (six) hours as needed (pain). For  10 days  1   No current facility-administered medications for this visit.    Functional Status:   In your present state of health, do you have any difficulty performing the following activities: 05/01/2015 02/19/2015  Hearing? - Y  Vision? N N  Difficulty concentrating or making decisions? N Y  Walking or climbing stairs? - Y  Dressing or bathing? N N  Doing errands, shopping? - Scientist, forensic and eating ? - N  Using the Toilet? - N  In the past six months, have you accidently leaked urine? - N  Do you have problems with loss of bowel control? - N  Managing your Medications? - N  Managing your Finances? - Y  Housekeeping or managing your Housekeeping? - Y    Fall/Depression Screening:    PHQ 2/9 Scores 02/19/2015  PHQ - 2 Score 1    Assessment:  CKD s/p Graft placement R arm - healing well                          Diabetes control improved with insulin and improved diet compliance.  Plan:   Follow up with pt in one month.

## 2015-05-20 DIAGNOSIS — R300 Dysuria: Secondary | ICD-10-CM | POA: Diagnosis not present

## 2015-06-03 ENCOUNTER — Other Ambulatory Visit: Payer: Self-pay | Admitting: *Deleted

## 2015-06-03 NOTE — Patient Outreach (Signed)
Mrs. Stehlin called to request a new Rx for her husband. He recently had his fistula placed for dialysis and the area is still bothering him. He has been taking tramadol 50 mg prn, ususally at bedtime for this discomfort so he can fall asleep. I have called in an Rx: Tramadol 50 mg one po q12 hours prn moderate to severe pain.  Deloria Lair [facility] [place] [phone]

## 2015-06-11 ENCOUNTER — Other Ambulatory Visit: Payer: Self-pay | Admitting: *Deleted

## 2015-06-11 NOTE — Patient Outreach (Signed)
[facility]) Care Management   06/11/2015  Patrick Boyle 17-May-1934 [id number]  Patrick Boyle is an 81 y.o. male  Subjective: Doing well. Pt will be taken off Levemir and transitioned to Trujeo when the former is out. His graft seems to getting well established.  Pt has been taking furosemide 20 mg daily for the last week. His wt is down and his BP is low today.  Objective:   Review of Systems  Constitutional: Negative.   HENT: Negative.   Eyes: Negative.   Respiratory: Negative.   Cardiovascular: Negative.   Gastrointestinal: Positive for diarrhea.       Occasional diarrhea about once a week. Takes immodium  Genitourinary: Negative.   Musculoskeletal: Positive for myalgias.  Skin: Negative.   Neurological: Negative.   Endo/Heme/Allergies: Negative.   Psychiatric/Behavioral: Negative.     BP 98/40 mmHg  Pulse 78  Resp 16  Wt 183 lb (83.008 kg)  SpO2 98%  FBS range 94-167   Physical Exam  Constitutional: He is oriented to person, place, and time. He appears well-developed and well-nourished.  HENT:  Head: Normocephalic.  Cardiovascular: Normal rate, regular rhythm and normal heart sounds.   Respiratory: Effort normal and breath sounds normal.  Musculoskeletal: Normal range of motion.  Neurological: He is alert and oriented to person, place, and time.  Skin: Skin is warm and dry.    Current Medications:   Current Outpatient Prescriptions  Medication Sig Dispense Refill  . Acetaminophen (ARTHRITIS PAIN RELIEF PO) Take 650 mg by mouth 2 (two) times daily.     Marland Kitchen aspirin EC 81 MG tablet Take 81 mg by mouth daily.    . bimatoprost (LUMIGAN) 0.03 % ophthalmic solution Place 1 drop into both eyes at bedtime.     . calcitRIOL (ROCALTROL) 0.25 MCG capsule Take 0.25 mcg by mouth daily.    . cetirizine (ZYRTEC) 10 MG tablet Take 5 mg by mouth daily.      . cholecalciferol (VITAMIN D) 1000 UNITS tablet Take 1,000 Units by mouth daily.    . cyanocobalamin  500 MCG tablet Take 500 mcg by mouth daily.    Marland Kitchen donepezil (ARICEPT) 10 MG tablet Take 10 mg by mouth at bedtime.    . fish oil-omega-3 fatty acids 1000 MG capsule Take 2 g by mouth 2 (two) times daily.     . furosemide (LASIX) 20 MG tablet TAKE 1 TABLET EVERY OTHER DAY ORALLY FOR 30  0  . gabapentin (NEURONTIN) 300 MG capsule Take 300 mg by mouth 3 (three) times daily.    . lansoprazole (PREVACID) 30 MG capsule Take 30 mg by mouth daily as needed (for acid reflex).     Marland Kitchen levothyroxine (SYNTHROID, LEVOTHROID) 50 MCG tablet Take 50 mcg by mouth daily before breakfast.    . MECLIZINE HCL PO Take 5 mg by mouth daily as needed (vertigo).    . ONE TOUCH ULTRA TEST test strip TEST 1 TIME DAILY.  3  . simvastatin (ZOCOR) 40 MG tablet Take 40 mg by mouth at bedtime.      . sitaGLIPtin (JANUVIA) 50 MG tablet Take 25 mg by mouth daily with breakfast.     . sodium bicarbonate 650 MG tablet Take 1 tablet (650 mg total) by mouth 2 (two) times daily. 60 tablet 1  . tamsulosin (FLOMAX) 0.4 MG CAPS capsule Take 1 capsule (0.4 mg total) by mouth daily after supper. 30 capsule 1  . traMADol (ULTRAM) 50 MG tablet Take 1 tablet  by mouth every 6 (six) hours as needed (pain). For 10 days  1  . insulin detemir (LEVEMIR) 100 UNIT/ML injection Inject 34 Units into the skin at bedtime.     No current facility-administered medications for this visit.    Functional Status:   In your present state of health, do you have any difficulty performing the following activities: 05/01/2015 02/19/2015  Hearing? - Y  Vision? N N  Difficulty concentrating or making decisions? N Y  Walking or climbing stairs? - Y  Dressing or bathing? N N  Doing errands, shopping? - Scientist, forensic and eating ? - N  Using the Toilet? - N  In the past six months, have you accidently leaked urine? - N  Do you have problems with loss of bowel control? - N  Managing your Medications? - N  Managing your Finances? - Y  Housekeeping or managing  your Housekeeping? - Y    Fall/Depression Screening:    PHQ 2/9 Scores 02/19/2015  PHQ - 2 Score 1    Assessment:  CKD with established graft   DM good control  Plan:  Continue your excellent self care.  Call if any problems arise.   Discussed potential graduation next month.

## 2015-06-13 ENCOUNTER — Ambulatory Visit (HOSPITAL_COMMUNITY)
Admission: RE | Admit: 2015-06-13 | Discharge: 2015-06-13 | Disposition: A | Discharge status: Home / Self Care | Payer: Medicare Other | Source: Ambulatory Visit | Attending: Vascular Surgery | Admitting: Vascular Surgery

## 2015-06-13 ENCOUNTER — Other Ambulatory Visit: Payer: Self-pay | Admitting: Vascular Surgery

## 2015-06-13 DIAGNOSIS — N186 End stage renal disease: Secondary | ICD-10-CM

## 2015-06-13 DIAGNOSIS — Z4931 Encounter for adequacy testing for hemodialysis: Secondary | ICD-10-CM

## 2015-06-14 DIAGNOSIS — N138 Other obstructive and reflux uropathy: Secondary | ICD-10-CM | POA: Diagnosis not present

## 2015-06-14 DIAGNOSIS — N401 Enlarged prostate with lower urinary tract symptoms: Secondary | ICD-10-CM | POA: Diagnosis not present

## 2015-06-14 DIAGNOSIS — N390 Urinary tract infection, site not specified: Secondary | ICD-10-CM | POA: Diagnosis not present

## 2015-06-14 DIAGNOSIS — B9620 Unspecified Escherichia coli [E. coli] as the cause of diseases classified elsewhere: Secondary | ICD-10-CM | POA: Diagnosis not present

## 2015-06-18 ENCOUNTER — Encounter: Payer: Self-pay | Admitting: Vascular Surgery

## 2015-06-20 ENCOUNTER — Encounter: Payer: Self-pay | Admitting: Vascular Surgery

## 2015-06-20 ENCOUNTER — Ambulatory Visit (INDEPENDENT_AMBULATORY_CARE_PROVIDER_SITE_OTHER): Payer: Medicare Other | Admitting: Vascular Surgery

## 2015-06-20 VITALS — BP 132/75 | HR 68 | Temp 97.9°F | Resp 14 | Ht 70.5 in | Wt 191.0 lb

## 2015-06-20 DIAGNOSIS — N184 Chronic kidney disease, stage 4 (severe): Secondary | ICD-10-CM

## 2015-06-20 NOTE — Progress Notes (Signed)
Patient is a 81 year old male who returns today for follow-up after placement of a right radiocephalic AV fistula on 64/68/0321. He is currently not on dialysis. He had a duplex ultrasound of the fistula approximate one week ago which showed the fistula diameter is 4-5 mm and 1 cm in depth in the mid forearm.  Physical exam:  Filed Vitals:   06/20/15 1133  BP: 132/75  Pulse: 68  Temp: 97.9 F (36.6 C)  TempSrc: Oral  Resp: 14  Height: 5' 10.5" (1.791 m)  Weight: 191 lb (86.637 kg)  SpO2: 100%    Right upper extremity: Palpable thrill in fistula fistula is palpable in the proximal forearm becomes more difficult to feel in the upper forearm  Assessment: Maturing fistula right radiocephalic  Plan: The patient will follow-up in 6 weeks for repeat ultrasound. If at that time the fistula has not fully developed we would consider a fistulogram. However since the patient is not on dialysis we may continue observation. Down the road if the fistula has developed but is to [facility] might need a Superficialization procedure. Patient was also instructed today and how to exercise the fistula to increase its development.  Ruta Hinds, MD [facility] [place] Office: [phone] Pager: [phone]

## 2015-06-25 NOTE — Addendum Note (Signed)
Addended by: Dorthula Rue L on: 06/25/2015 05:18 PM   Modules accepted: Orders

## 2015-07-04 DIAGNOSIS — H524 Presbyopia: Secondary | ICD-10-CM | POA: Diagnosis not present

## 2015-07-04 DIAGNOSIS — H401211 Low-tension glaucoma, right eye, mild stage: Secondary | ICD-10-CM | POA: Diagnosis not present

## 2015-07-08 DIAGNOSIS — I2510 Atherosclerotic heart disease of native coronary artery without angina pectoris: Secondary | ICD-10-CM | POA: Diagnosis not present

## 2015-07-08 DIAGNOSIS — N320 Bladder-neck obstruction: Secondary | ICD-10-CM | POA: Diagnosis not present

## 2015-07-08 DIAGNOSIS — G629 Polyneuropathy, unspecified: Secondary | ICD-10-CM | POA: Diagnosis not present

## 2015-07-08 DIAGNOSIS — E119 Type 2 diabetes mellitus without complications: Secondary | ICD-10-CM | POA: Diagnosis not present

## 2015-07-08 DIAGNOSIS — N400 Enlarged prostate without lower urinary tract symptoms: Secondary | ICD-10-CM | POA: Diagnosis not present

## 2015-07-08 DIAGNOSIS — N390 Urinary tract infection, site not specified: Secondary | ICD-10-CM | POA: Diagnosis not present

## 2015-07-08 DIAGNOSIS — F0390 Unspecified dementia without behavioral disturbance: Secondary | ICD-10-CM | POA: Diagnosis not present

## 2015-07-08 DIAGNOSIS — N2581 Secondary hyperparathyroidism of renal origin: Secondary | ICD-10-CM | POA: Diagnosis not present

## 2015-07-08 DIAGNOSIS — N189 Chronic kidney disease, unspecified: Secondary | ICD-10-CM | POA: Diagnosis not present

## 2015-07-08 DIAGNOSIS — I770 Arteriovenous fistula, acquired: Secondary | ICD-10-CM | POA: Diagnosis not present

## 2015-07-08 DIAGNOSIS — D631 Anemia in chronic kidney disease: Secondary | ICD-10-CM | POA: Diagnosis not present

## 2015-07-11 ENCOUNTER — Ambulatory Visit (INDEPENDENT_AMBULATORY_CARE_PROVIDER_SITE_OTHER): Payer: Medicare Other | Admitting: *Deleted

## 2015-07-11 DIAGNOSIS — I259 Chronic ischemic heart disease, unspecified: Secondary | ICD-10-CM

## 2015-07-11 NOTE — Progress Notes (Signed)
Remote ICD transmission.   

## 2015-07-12 DIAGNOSIS — M19049 Primary osteoarthritis, unspecified hand: Secondary | ICD-10-CM | POA: Diagnosis not present

## 2015-07-12 DIAGNOSIS — M25532 Pain in left wrist: Secondary | ICD-10-CM | POA: Diagnosis not present

## 2015-07-15 LAB — CUP PACEART REMOTE DEVICE CHECK
Battery Remaining Longevity: 74 mo
Battery Voltage: 2.96 V
Brady Statistic RV Percent Paced: 3.1 %
Date Time Interrogation Session: 20161110085823
HighPow Impedance: 65 Ohm
HighPow Impedance: 65 Ohm
Implantable Lead Location: 753860
Lead Channel Pacing Threshold Amplitude: 1 V
Lead Channel Setting Sensing Sensitivity: 0.5 mV
MDC IDC LEAD IMPLANT DT: 20121011
MDC IDC MSMT BATTERY REMAINING PERCENTAGE: 64 %
MDC IDC MSMT LEADCHNL RV IMPEDANCE VALUE: 380 Ohm
MDC IDC MSMT LEADCHNL RV PACING THRESHOLD PULSEWIDTH: 0.5 ms
MDC IDC MSMT LEADCHNL RV SENSING INTR AMPL: 12 mV
MDC IDC PG SERIAL: 1016528
MDC IDC SET LEADCHNL RV PACING AMPLITUDE: 2.5 V
MDC IDC SET LEADCHNL RV PACING PULSEWIDTH: 0.5 ms

## 2015-07-16 ENCOUNTER — Encounter: Payer: Self-pay | Admitting: Cardiology

## 2015-07-22 DIAGNOSIS — N184 Chronic kidney disease, stage 4 (severe): Secondary | ICD-10-CM | POA: Diagnosis not present

## 2015-07-22 DIAGNOSIS — Z794 Long term (current) use of insulin: Secondary | ICD-10-CM | POA: Diagnosis not present

## 2015-07-22 DIAGNOSIS — E1122 Type 2 diabetes mellitus with diabetic chronic kidney disease: Secondary | ICD-10-CM | POA: Diagnosis not present

## 2015-07-22 DIAGNOSIS — E1142 Type 2 diabetes mellitus with diabetic polyneuropathy: Secondary | ICD-10-CM | POA: Diagnosis not present

## 2015-07-22 DIAGNOSIS — E782 Mixed hyperlipidemia: Secondary | ICD-10-CM | POA: Diagnosis not present

## 2015-07-23 ENCOUNTER — Other Ambulatory Visit: Payer: Self-pay | Admitting: *Deleted

## 2015-07-24 ENCOUNTER — Encounter: Payer: Self-pay | Admitting: *Deleted

## 2015-07-24 NOTE — Patient Outreach (Signed)
[facility]) Care Management  07/24/2015  CORINTHIAN FAYNE [birth year] [id number]   S:  Today, is my last visit to see Mr. Sender. He reports he is doing well. He and his wife just drove to [place] to visit family and celebrate their 37th wedding anniversary.  His glucose levels are doing relatively well. He is still having some discomfort in this R wrist, distal to the graft placement and he takes tramadol for pain at hs so he can go to sleep. He reports his creatinine level has improved.   O:  BP 138/70 mmHg  Pulse 79  Resp 16  Wt 183 lb (83.008 kg)  SpO2 97% FBS: 144        RRR, Lungs are clear, no edema. Toenails are long and great toenails have minor onychomycosis.  A:  DM with CKD  P:  Debrided toenails for pt.       Reinforced his diabetic diet and monitoring.       Reinforced need to contact MD early for any problemls.       Advised of closing his case today, but that if issues arise in the future, he can call me.  It has been a pleasure serving Mr. Greear!

## 2015-07-29 ENCOUNTER — Ambulatory Visit (HOSPITAL_COMMUNITY)
Admission: RE | Admit: 2015-07-29 | Discharge: 2015-07-29 | Disposition: A | Discharge status: Home / Self Care | Payer: Medicare Other | Source: Ambulatory Visit | Attending: Vascular Surgery | Admitting: Vascular Surgery

## 2015-07-29 DIAGNOSIS — I129 Hypertensive chronic kidney disease with stage 1 through stage 4 chronic kidney disease, or unspecified chronic kidney disease: Secondary | ICD-10-CM | POA: Insufficient documentation

## 2015-07-29 DIAGNOSIS — E785 Hyperlipidemia, unspecified: Secondary | ICD-10-CM | POA: Insufficient documentation

## 2015-07-29 DIAGNOSIS — N184 Chronic kidney disease, stage 4 (severe): Secondary | ICD-10-CM | POA: Diagnosis not present

## 2015-07-29 DIAGNOSIS — E119 Type 2 diabetes mellitus without complications: Secondary | ICD-10-CM | POA: Diagnosis not present

## 2015-07-30 ENCOUNTER — Encounter: Payer: Self-pay | Admitting: *Deleted

## 2015-07-31 ENCOUNTER — Encounter: Payer: Self-pay | Admitting: Vascular Surgery

## 2015-08-01 ENCOUNTER — Ambulatory Visit (INDEPENDENT_AMBULATORY_CARE_PROVIDER_SITE_OTHER): Payer: Medicare Other | Admitting: Vascular Surgery

## 2015-08-01 ENCOUNTER — Encounter: Payer: Self-pay | Admitting: Vascular Surgery

## 2015-08-01 VITALS — BP 133/69 | HR 79 | Temp 98.1°F | Ht 70.5 in | Wt 186.0 lb

## 2015-08-01 DIAGNOSIS — N184 Chronic kidney disease, stage 4 (severe): Secondary | ICD-10-CM

## 2015-08-01 NOTE — Progress Notes (Signed)
Patient is a 81 year old male who returns today for follow-up after placement of a right radiocephalic AV fistula on [id number]. He is currently not on dialysis. He had a duplex ultrasound of the fistula approximate one week ago which showed the fistula diameter is 4-5 mm and 1 cm in depth in the mid forearm.  Physical exam:    Filed Vitals:   08/01/15 1121  BP: 133/69  Pulse: 79  Temp: 98.1 F (36.7 C)  TempSrc: Oral  Height: 5' 10.5" (1.791 m)  Weight: 186 lb (84.369 kg)  SpO2: 98%     Right upper extremity: Palpable thrill in fistula fistula is palpable in the proximal forearm becomes more difficult to feel in the upper forearm  Data: The fistula is a about 5 mm in diameter throughout its course. It is 5 mm in depth.  Assessment: Maturing fistula right radiocephalic  Plan:   Patient was also instructed today and how to exercise the fistula to increase its development. He will follow-up on as-needed basis  Ruta Hinds, MD [facility] [place] Office: [phone] Pager: [phone]

## 2015-08-14 DIAGNOSIS — N184 Chronic kidney disease, stage 4 (severe): Secondary | ICD-10-CM | POA: Diagnosis not present

## 2015-08-14 DIAGNOSIS — Z794 Long term (current) use of insulin: Secondary | ICD-10-CM | POA: Diagnosis not present

## 2015-08-14 DIAGNOSIS — E1142 Type 2 diabetes mellitus with diabetic polyneuropathy: Secondary | ICD-10-CM | POA: Diagnosis not present

## 2015-08-14 DIAGNOSIS — E782 Mixed hyperlipidemia: Secondary | ICD-10-CM | POA: Diagnosis not present

## 2015-08-14 DIAGNOSIS — E1122 Type 2 diabetes mellitus with diabetic chronic kidney disease: Secondary | ICD-10-CM | POA: Diagnosis not present

## 2015-09-16 DIAGNOSIS — I770 Arteriovenous fistula, acquired: Secondary | ICD-10-CM | POA: Diagnosis not present

## 2015-09-16 DIAGNOSIS — I2510 Atherosclerotic heart disease of native coronary artery without angina pectoris: Secondary | ICD-10-CM | POA: Diagnosis not present

## 2015-09-16 DIAGNOSIS — N189 Chronic kidney disease, unspecified: Secondary | ICD-10-CM | POA: Diagnosis not present

## 2015-09-16 DIAGNOSIS — D631 Anemia in chronic kidney disease: Secondary | ICD-10-CM | POA: Diagnosis not present

## 2015-09-16 DIAGNOSIS — N390 Urinary tract infection, site not specified: Secondary | ICD-10-CM | POA: Diagnosis not present

## 2015-09-16 DIAGNOSIS — G629 Polyneuropathy, unspecified: Secondary | ICD-10-CM | POA: Diagnosis not present

## 2015-09-16 DIAGNOSIS — F0390 Unspecified dementia without behavioral disturbance: Secondary | ICD-10-CM | POA: Diagnosis not present

## 2015-09-16 DIAGNOSIS — N320 Bladder-neck obstruction: Secondary | ICD-10-CM | POA: Diagnosis not present

## 2015-09-16 DIAGNOSIS — E119 Type 2 diabetes mellitus without complications: Secondary | ICD-10-CM | POA: Diagnosis not present

## 2015-09-16 DIAGNOSIS — N2581 Secondary hyperparathyroidism of renal origin: Secondary | ICD-10-CM | POA: Diagnosis not present

## 2015-10-14 ENCOUNTER — Other Ambulatory Visit: Payer: Self-pay | Admitting: Interventional Cardiology

## 2015-10-14 DIAGNOSIS — I6523 Occlusion and stenosis of bilateral carotid arteries: Secondary | ICD-10-CM

## 2015-10-18 ENCOUNTER — Encounter: Payer: Medicare Other | Admitting: Internal Medicine

## 2015-10-23 ENCOUNTER — Ambulatory Visit (HOSPITAL_COMMUNITY)
Admission: RE | Admit: 2015-10-23 | Discharge: 2015-10-23 | Disposition: A | Discharge status: Home / Self Care | Payer: Medicare Other | Source: Ambulatory Visit | Attending: Cardiovascular Disease | Admitting: Cardiovascular Disease

## 2015-10-23 DIAGNOSIS — E1122 Type 2 diabetes mellitus with diabetic chronic kidney disease: Secondary | ICD-10-CM | POA: Diagnosis not present

## 2015-10-23 DIAGNOSIS — I6523 Occlusion and stenosis of bilateral carotid arteries: Secondary | ICD-10-CM

## 2015-10-23 DIAGNOSIS — I509 Heart failure, unspecified: Secondary | ICD-10-CM | POA: Diagnosis not present

## 2015-10-23 DIAGNOSIS — Z950 Presence of cardiac pacemaker: Secondary | ICD-10-CM | POA: Insufficient documentation

## 2015-10-23 DIAGNOSIS — N189 Chronic kidney disease, unspecified: Secondary | ICD-10-CM | POA: Insufficient documentation

## 2015-10-23 DIAGNOSIS — I130 Hypertensive heart and chronic kidney disease with heart failure and stage 1 through stage 4 chronic kidney disease, or unspecified chronic kidney disease: Secondary | ICD-10-CM | POA: Insufficient documentation

## 2015-10-23 DIAGNOSIS — E7800 Pure hypercholesterolemia, unspecified: Secondary | ICD-10-CM | POA: Diagnosis not present

## 2015-11-01 DIAGNOSIS — Z23 Encounter for immunization: Secondary | ICD-10-CM | POA: Diagnosis not present

## 2015-11-01 DIAGNOSIS — E1122 Type 2 diabetes mellitus with diabetic chronic kidney disease: Secondary | ICD-10-CM | POA: Diagnosis not present

## 2015-11-01 DIAGNOSIS — Z0000 Encounter for general adult medical examination without abnormal findings: Secondary | ICD-10-CM | POA: Diagnosis not present

## 2015-11-01 DIAGNOSIS — E1142 Type 2 diabetes mellitus with diabetic polyneuropathy: Secondary | ICD-10-CM | POA: Diagnosis not present

## 2015-11-01 DIAGNOSIS — F0390 Unspecified dementia without behavioral disturbance: Secondary | ICD-10-CM | POA: Diagnosis not present

## 2015-11-01 DIAGNOSIS — I700 Atherosclerosis of aorta: Secondary | ICD-10-CM | POA: Diagnosis not present

## 2015-11-01 DIAGNOSIS — E039 Hypothyroidism, unspecified: Secondary | ICD-10-CM | POA: Diagnosis not present

## 2015-11-01 DIAGNOSIS — I10 Essential (primary) hypertension: Secondary | ICD-10-CM | POA: Diagnosis not present

## 2015-11-01 DIAGNOSIS — J449 Chronic obstructive pulmonary disease, unspecified: Secondary | ICD-10-CM | POA: Diagnosis not present

## 2015-11-01 DIAGNOSIS — N2581 Secondary hyperparathyroidism of renal origin: Secondary | ICD-10-CM | POA: Diagnosis not present

## 2015-11-01 DIAGNOSIS — E782 Mixed hyperlipidemia: Secondary | ICD-10-CM | POA: Diagnosis not present

## 2015-11-01 DIAGNOSIS — N184 Chronic kidney disease, stage 4 (severe): Secondary | ICD-10-CM | POA: Diagnosis not present

## 2015-11-14 ENCOUNTER — Encounter: Payer: Self-pay | Admitting: Internal Medicine

## 2015-11-20 ENCOUNTER — Encounter: Payer: Self-pay | Admitting: Podiatry

## 2015-11-20 ENCOUNTER — Ambulatory Visit (INDEPENDENT_AMBULATORY_CARE_PROVIDER_SITE_OTHER): Payer: Medicare Other | Admitting: Podiatry

## 2015-11-20 VITALS — BP 135/65 | HR 71 | Resp 14

## 2015-11-20 DIAGNOSIS — S90229A Contusion of unspecified lesser toe(s) with damage to nail, initial encounter: Secondary | ICD-10-CM

## 2015-11-20 DIAGNOSIS — I6523 Occlusion and stenosis of bilateral carotid arteries: Secondary | ICD-10-CM | POA: Diagnosis not present

## 2015-11-20 NOTE — Progress Notes (Signed)
Subjective:     Patient ID: Patrick Boyle, male   DOB: Apr 02, 1934, 82 y.o.   MRN: [id number]  HPI this patient presents to the office with chief complaint of a swollen, discolored big toe. He says he dropped a hammer on his big toe on Monday. He states the toe became painful since and swollen from the hammer falling on the tip of his toe. He says today he is having no pain or discomfort to the right toe wearing his shoes. He does have mild discoloration still at the site of the injury. He also was seen a year ago for a incurvated fungus toenail on the right great toe. He presents the office today for an evaluation and treatment of this toe. Patient is diabetic with neuropathy   Review of Systems     Objective:   Physical Exam GENERAL APPEARANCE: Alert, conversant. Appropriately groomed. No acute distress.  VASCULAR: Pedal pulses are  palpable at  [facility] and PT bilateral.  Capillary refill time is immediate to all digits,  Normal temperature gradient.  Digital hair growth is present bilateral  NEUROLOGIC: sensation is normal to 5.07 monofilament at 5/5 sites bilateral.  Light touch is intact bilateral, Muscle strength normal.  MUSCULOSKELETAL: acceptable muscle strength, tone and stability bilateral.  Intrinsic muscluature intact bilateral.  Rectus appearance ofat this visit.  foot and digits noted bilateral. No palpable pain right hallux .  Minimal swelling with purplish hue at sight of injury.  DERMATOLOGIC: skin color, texture, and turgor are within normal limits.  No preulcerative lesions or ulcers  are seen, no interdigital maceration noted.  No open lesions present.  Incurvated right hallux with yellowish discoloration at proximal nail fold.  White superficial spreading fungus medial border.. No drainage noted.      Assessment:    Contusion right hallux   Onychomycosis.     Plan:    ROV   The right hallux is healing from his bone contusion.  His nail fungus is a separate problem.  Told him  about Formula 3.   Gardiner Barefoot DPM

## 2015-11-27 ENCOUNTER — Ambulatory Visit (INDEPENDENT_AMBULATORY_CARE_PROVIDER_SITE_OTHER): Payer: Medicare Other | Admitting: Podiatry

## 2015-11-27 ENCOUNTER — Encounter: Payer: Self-pay | Admitting: Podiatry

## 2015-11-27 VITALS — BP 148/71 | HR 67 | Resp 14

## 2015-11-27 DIAGNOSIS — B351 Tinea unguium: Secondary | ICD-10-CM | POA: Diagnosis not present

## 2015-11-27 DIAGNOSIS — I6523 Occlusion and stenosis of bilateral carotid arteries: Secondary | ICD-10-CM | POA: Diagnosis not present

## 2015-11-27 DIAGNOSIS — M79609 Pain in unspecified limb: Secondary | ICD-10-CM

## 2015-11-27 DIAGNOSIS — S90229A Contusion of unspecified lesser toe(s) with damage to nail, initial encounter: Secondary | ICD-10-CM

## 2015-11-27 NOTE — Progress Notes (Signed)
Subjective:     Patient ID: Patrick Boyle, male   DOB: October 04, 1933, 82 y.o.   MRN: [id number]  HPI this patient returns to the office 1 week after injuring the tip of his big toe, right foot. He states that his toe is much improved and is looking its normal color. He states he is now capable wearing shoes in no pain. He returns to the office for an evaluation and treatment Review of Systems     Objective:   Physical Exam  Objective:   Physical Exam GENERAL APPEARANCE: Alert, conversant. Appropriately groomed. No acute distress.  VASCULAR: Pedal pulses are palpable at [facility] and PT bilateral. Capillary refill time is immediate to all digits, Normal temperature gradient. Digital hair growth is present bilateral  NEUROLOGIC: sensation is normal to 5.07 monofilament at 5/5 sites bilateral. Light touch is intact bilateral, Muscle strength normal.  MUSCULOSKELETAL: acceptable muscle strength, tone and stability bilateral. Intrinsic muscluature intact bilateral. Rectus appearance ofat this visit. foot and digits noted bilateral. No palpable pain right hallux . Normal color noted..  DERMATOLOGIC: skin color, texture, and turgor are within normal limits. No preulcerative lesions or ulcers are seen, no interdigital maceration noted. No open lesions present. Incurvated right hallux with yellowish discoloration at proximal nail fold. White superficial spreading fungus medial border.. No drainage noted            Assessment:     Bone contusion right hallux     Plan:     ROV  Patient was discharged after minimal nail work on hallux toenail right foot.  RTC prn   Gardiner Barefoot DPM

## 2015-12-04 ENCOUNTER — Encounter: Payer: Medicare Other | Admitting: Internal Medicine

## 2015-12-05 DIAGNOSIS — M25562 Pain in left knee: Secondary | ICD-10-CM | POA: Diagnosis not present

## 2015-12-05 DIAGNOSIS — M25552 Pain in left hip: Secondary | ICD-10-CM | POA: Diagnosis not present

## 2015-12-05 DIAGNOSIS — M1612 Unilateral primary osteoarthritis, left hip: Secondary | ICD-10-CM | POA: Diagnosis not present

## 2015-12-06 ENCOUNTER — Telehealth: Payer: Self-pay | Admitting: Interventional Cardiology

## 2015-12-06 NOTE — Telephone Encounter (Signed)
Pts wife is calling to give Dr Irish Lack and nurse a heads up that the pt will be scheduled sometime in May for left hip replacement, done by Dr Alvan Dame at [facility].  Pts wife states that he just saw Dr Alvan Dame yesterday, and his office will be calling them back either today or next Monday to schedule his surgery for sometime in May.  Advised the wife that when they call them back to schedule his surgery, the surgery scheduler for Dr Alvan Dame should call our office to give surgical clearance information for Dr Irish Lack to advise on, or fax [place] a surgical clearance form at [phone] Attention Dr Irish Lack and [facility] Hawking.  Wife also wants Dr Irish Lack to advise if the pt should be seen prior to his surgery in May.  Noted the pt does have an appt on 4/17 for Defib check.  Per the wife, the pt has had no cardiac issues or no new changes.  Informed the wife that Dr Irish Lack and nurse are both out of the office today, but I will route this message to both of them for further review, recommendation, and follow-up thereafter.  Wife verbalized understanding.

## 2015-12-06 NOTE — Telephone Encounter (Signed)
Pt having a left hip replacement in May by Dr. Alvan Dame, wife asking if Dr. Irish Lack needs to see before pls call [phone]

## 2015-12-10 ENCOUNTER — Inpatient Hospital Stay (HOSPITAL_COMMUNITY)
Admission: EM | Admit: 2015-12-10 | Discharge: 2015-12-13 | DRG: 698 | Disposition: A | Discharge status: Home / Self Care | Payer: Medicare Other | Attending: Internal Medicine | Admitting: Internal Medicine

## 2015-12-10 ENCOUNTER — Encounter (HOSPITAL_COMMUNITY): Payer: Self-pay | Admitting: Emergency Medicine

## 2015-12-10 ENCOUNTER — Inpatient Hospital Stay (HOSPITAL_COMMUNITY): Payer: Medicare Other

## 2015-12-10 DIAGNOSIS — R531 Weakness: Secondary | ICD-10-CM | POA: Diagnosis not present

## 2015-12-10 DIAGNOSIS — I2510 Atherosclerotic heart disease of native coronary artery without angina pectoris: Secondary | ICD-10-CM | POA: Diagnosis present

## 2015-12-10 DIAGNOSIS — E119 Type 2 diabetes mellitus without complications: Secondary | ICD-10-CM

## 2015-12-10 DIAGNOSIS — R509 Fever, unspecified: Secondary | ICD-10-CM | POA: Diagnosis not present

## 2015-12-10 DIAGNOSIS — I252 Old myocardial infarction: Secondary | ICD-10-CM | POA: Diagnosis not present

## 2015-12-10 DIAGNOSIS — N184 Chronic kidney disease, stage 4 (severe): Secondary | ICD-10-CM | POA: Diagnosis present

## 2015-12-10 DIAGNOSIS — E1121 Type 2 diabetes mellitus with diabetic nephropathy: Secondary | ICD-10-CM | POA: Diagnosis present

## 2015-12-10 DIAGNOSIS — H8109 Meniere's disease, unspecified ear: Secondary | ICD-10-CM | POA: Diagnosis present

## 2015-12-10 DIAGNOSIS — Z9842 Cataract extraction status, left eye: Secondary | ICD-10-CM

## 2015-12-10 DIAGNOSIS — R410 Disorientation, unspecified: Secondary | ICD-10-CM | POA: Diagnosis not present

## 2015-12-10 DIAGNOSIS — I255 Ischemic cardiomyopathy: Secondary | ICD-10-CM | POA: Diagnosis present

## 2015-12-10 DIAGNOSIS — Z888 Allergy status to other drugs, medicaments and biological substances status: Secondary | ICD-10-CM | POA: Diagnosis not present

## 2015-12-10 DIAGNOSIS — N400 Enlarged prostate without lower urinary tract symptoms: Secondary | ICD-10-CM | POA: Diagnosis present

## 2015-12-10 DIAGNOSIS — Z87891 Personal history of nicotine dependence: Secondary | ICD-10-CM

## 2015-12-10 DIAGNOSIS — Z794 Long term (current) use of insulin: Secondary | ICD-10-CM

## 2015-12-10 DIAGNOSIS — N179 Acute kidney failure, unspecified: Secondary | ICD-10-CM

## 2015-12-10 DIAGNOSIS — Z66 Do not resuscitate: Secondary | ICD-10-CM | POA: Diagnosis present

## 2015-12-10 DIAGNOSIS — E785 Hyperlipidemia, unspecified: Secondary | ICD-10-CM | POA: Diagnosis present

## 2015-12-10 DIAGNOSIS — R404 Transient alteration of awareness: Secondary | ICD-10-CM | POA: Diagnosis not present

## 2015-12-10 DIAGNOSIS — B961 Klebsiella pneumoniae [K. pneumoniae] as the cause of diseases classified elsewhere: Secondary | ICD-10-CM | POA: Diagnosis present

## 2015-12-10 DIAGNOSIS — N390 Urinary tract infection, site not specified: Secondary | ICD-10-CM | POA: Diagnosis not present

## 2015-12-10 DIAGNOSIS — E1140 Type 2 diabetes mellitus with diabetic neuropathy, unspecified: Secondary | ICD-10-CM | POA: Diagnosis present

## 2015-12-10 DIAGNOSIS — E1122 Type 2 diabetes mellitus with diabetic chronic kidney disease: Secondary | ICD-10-CM | POA: Diagnosis present

## 2015-12-10 DIAGNOSIS — T83518A Infection and inflammatory reaction due to other urinary catheter, initial encounter: Principal | ICD-10-CM | POA: Diagnosis present

## 2015-12-10 DIAGNOSIS — A419 Sepsis, unspecified organism: Secondary | ICD-10-CM | POA: Diagnosis present

## 2015-12-10 DIAGNOSIS — D696 Thrombocytopenia, unspecified: Secondary | ICD-10-CM | POA: Diagnosis present

## 2015-12-10 DIAGNOSIS — Z9841 Cataract extraction status, right eye: Secondary | ICD-10-CM

## 2015-12-10 DIAGNOSIS — I5022 Chronic systolic (congestive) heart failure: Secondary | ICD-10-CM | POA: Diagnosis present

## 2015-12-10 DIAGNOSIS — Z95810 Presence of automatic (implantable) cardiac defibrillator: Secondary | ICD-10-CM

## 2015-12-10 DIAGNOSIS — Z7982 Long term (current) use of aspirin: Secondary | ICD-10-CM | POA: Diagnosis not present

## 2015-12-10 DIAGNOSIS — Y846 Urinary catheterization as the cause of abnormal reaction of the patient, or of later complication, without mention of misadventure at the time of the procedure: Secondary | ICD-10-CM | POA: Diagnosis present

## 2015-12-10 DIAGNOSIS — T83511A Infection and inflammatory reaction due to indwelling urethral catheter, initial encounter: Secondary | ICD-10-CM

## 2015-12-10 DIAGNOSIS — E039 Hypothyroidism, unspecified: Secondary | ICD-10-CM | POA: Diagnosis present

## 2015-12-10 DIAGNOSIS — K219 Gastro-esophageal reflux disease without esophagitis: Secondary | ICD-10-CM | POA: Diagnosis present

## 2015-12-10 DIAGNOSIS — N189 Chronic kidney disease, unspecified: Secondary | ICD-10-CM

## 2015-12-10 DIAGNOSIS — J449 Chronic obstructive pulmonary disease, unspecified: Secondary | ICD-10-CM | POA: Diagnosis present

## 2015-12-10 DIAGNOSIS — Z79899 Other long term (current) drug therapy: Secondary | ICD-10-CM

## 2015-12-10 DIAGNOSIS — I130 Hypertensive heart and chronic kidney disease with heart failure and stage 1 through stage 4 chronic kidney disease, or unspecified chronic kidney disease: Secondary | ICD-10-CM | POA: Diagnosis present

## 2015-12-10 DIAGNOSIS — IMO0002 Reserved for concepts with insufficient information to code with codable children: Secondary | ICD-10-CM

## 2015-12-10 DIAGNOSIS — Z951 Presence of aortocoronary bypass graft: Secondary | ICD-10-CM

## 2015-12-10 DIAGNOSIS — F0390 Unspecified dementia without behavioral disturbance: Secondary | ICD-10-CM | POA: Diagnosis present

## 2015-12-10 DIAGNOSIS — E1165 Type 2 diabetes mellitus with hyperglycemia: Secondary | ICD-10-CM | POA: Diagnosis present

## 2015-12-10 DIAGNOSIS — I10 Essential (primary) hypertension: Secondary | ICD-10-CM | POA: Diagnosis present

## 2015-12-10 DIAGNOSIS — A4159 Other Gram-negative sepsis: Secondary | ICD-10-CM | POA: Diagnosis present

## 2015-12-10 LAB — PROTIME-INR
INR: 1.15 (ref 0.00–1.49)
PROTHROMBIN TIME: 14.8 s (ref 11.6–15.2)

## 2015-12-10 LAB — COMPREHENSIVE METABOLIC PANEL
ALBUMIN: 3.4 g/dL — AB (ref 3.5–5.0)
ALK PHOS: 51 U/L (ref 38–126)
ALT: 15 U/L — AB (ref 17–63)
ANION GAP: 11 (ref 5–15)
AST: 18 U/L (ref 15–41)
BILIRUBIN TOTAL: 0.9 mg/dL (ref 0.3–1.2)
BUN: 58 mg/dL — AB (ref 6–20)
CALCIUM: 9.2 mg/dL (ref 8.9–10.3)
CO2: 22 mmol/L (ref 22–32)
CREATININE: 3.27 mg/dL — AB (ref 0.61–1.24)
Chloride: 108 mmol/L (ref 101–111)
GFR calc Af Amer: 19 mL/min — ABNORMAL LOW (ref >60)
GFR calc non Af Amer: 17 mL/min — ABNORMAL LOW (ref >60)
GLUCOSE: 154 mg/dL — AB (ref 65–99)
Potassium: 5.1 mmol/L (ref 3.5–5.1)
SODIUM: 141 mmol/L (ref 135–145)
TOTAL PROTEIN: 6.3 g/dL — AB (ref 6.5–8.1)

## 2015-12-10 LAB — URINE MICROSCOPIC-ADD ON

## 2015-12-10 LAB — I-STAT CG4 LACTIC ACID, ED: Lactic Acid, Venous: 1.52 mmol/L (ref 0.5–2.0)

## 2015-12-10 LAB — CBC WITH DIFFERENTIAL/PLATELET
BASOS PCT: 0 %
Basophils Absolute: 0 10*3/uL (ref 0.0–0.1)
EOS ABS: 0 10*3/uL (ref 0.0–0.7)
EOS PCT: 0 %
HCT: 35.1 % — ABNORMAL LOW (ref 39.0–52.0)
HEMOGLOBIN: 11.9 g/dL — AB (ref 13.0–17.0)
LYMPHS ABS: 0.5 10*3/uL — AB (ref 0.7–4.0)
Lymphocytes Relative: 5 %
MCH: 31.7 pg (ref 26.0–34.0)
MCHC: 33.9 g/dL (ref 30.0–36.0)
MCV: 93.6 fL (ref 78.0–100.0)
MONO ABS: 0.4 10*3/uL (ref 0.1–1.0)
MONOS PCT: 5 %
NEUTROS PCT: 90 %
Neutro Abs: 8.7 10*3/uL — ABNORMAL HIGH (ref 1.7–7.7)
PLATELETS: 120 10*3/uL — AB (ref 150–400)
RBC: 3.75 MIL/uL — ABNORMAL LOW (ref 4.22–5.81)
RDW: 13.1 % (ref 11.5–15.5)
WBC: 9.6 10*3/uL (ref 4.0–10.5)

## 2015-12-10 LAB — URINALYSIS, ROUTINE W REFLEX MICROSCOPIC
BILIRUBIN URINE: NEGATIVE
Glucose, UA: NEGATIVE mg/dL
KETONES UR: NEGATIVE mg/dL
NITRITE: NEGATIVE
Protein, ur: NEGATIVE mg/dL
SPECIFIC GRAVITY, URINE: 1.011 (ref 1.005–1.030)
pH: 5.5 (ref 5.0–8.0)

## 2015-12-10 LAB — GLUCOSE, CAPILLARY: Glucose-Capillary: 240 mg/dL — ABNORMAL HIGH (ref 65–99)

## 2015-12-10 LAB — LACTIC ACID, PLASMA: Lactic Acid, Venous: 1.3 mmol/L (ref 0.5–2.0)

## 2015-12-10 LAB — APTT: APTT: 28 s (ref 24–37)

## 2015-12-10 LAB — PROCALCITONIN: Procalcitonin: 4.63 ng/mL

## 2015-12-10 MED ORDER — ONDANSETRON HCL 4 MG PO TABS: 4.0000 mg | ORAL_TABLET | Freq: Four times a day (QID) | ORAL | Status: DC | PRN

## 2015-12-10 MED ORDER — ACETAMINOPHEN 325 MG PO TABS: 650.0000 mg | ORAL_TABLET | Freq: Four times a day (QID) | ORAL | Status: DC | PRN

## 2015-12-10 MED ORDER — ENOXAPARIN SODIUM 30 MG/0.3ML ~~LOC~~ SOLN
30.0000 mg | SUBCUTANEOUS | Status: DC
  Administered 2015-12-10: 30 mg via SUBCUTANEOUS
  Filled 2015-12-10: qty 0.3

## 2015-12-10 MED ORDER — ASPIRIN EC 81 MG PO TBEC
81.0000 mg | DELAYED_RELEASE_TABLET | Freq: Every day | ORAL | Status: DC
  Administered 2015-12-11 – 2015-12-13 (×3): 81 mg via ORAL
  Filled 2015-12-10 (×3): qty 1

## 2015-12-10 MED ORDER — ACETAMINOPHEN 325 MG PO TABS
650.0000 mg | ORAL_TABLET | Freq: Once | ORAL | Status: AC
  Administered 2015-12-10: 650 mg via ORAL
  Filled 2015-12-10: qty 2

## 2015-12-10 MED ORDER — PIPERACILLIN-TAZOBACTAM 3.375 G IVPB 30 MIN
3.3750 g | Freq: Once | INTRAVENOUS | Status: AC
  Administered 2015-12-10: 3.375 g via INTRAVENOUS
  Filled 2015-12-10: qty 50

## 2015-12-10 MED ORDER — ACETAMINOPHEN 650 MG RE SUPP: 650.0000 mg | Freq: Four times a day (QID) | RECTAL | Status: DC | PRN

## 2015-12-10 MED ORDER — CALCITRIOL 0.25 MCG PO CAPS
0.2500 ug | ORAL_CAPSULE | Freq: Every day | ORAL | Status: DC
  Administered 2015-12-11 – 2015-12-13 (×3): 0.25 ug via ORAL
  Filled 2015-12-10 (×3): qty 1

## 2015-12-10 MED ORDER — INSULIN GLARGINE 100 UNIT/ML ~~LOC~~ SOLN
15.0000 [IU] | Freq: Every day | SUBCUTANEOUS | Status: DC
  Administered 2015-12-10 – 2015-12-12 (×3): 15 [IU] via SUBCUTANEOUS
  Filled 2015-12-10 (×4): qty 0.15

## 2015-12-10 MED ORDER — PANTOPRAZOLE SODIUM 40 MG PO TBEC
40.0000 mg | DELAYED_RELEASE_TABLET | Freq: Every day | ORAL | Status: DC
  Administered 2015-12-10 – 2015-12-13 (×4): 40 mg via ORAL
  Filled 2015-12-10 (×4): qty 1

## 2015-12-10 MED ORDER — LEVOTHYROXINE SODIUM 50 MCG PO TABS
50.0000 ug | ORAL_TABLET | Freq: Every day | ORAL | Status: DC
  Administered 2015-12-11 – 2015-12-13 (×3): 50 ug via ORAL
  Filled 2015-12-10 (×3): qty 1

## 2015-12-10 MED ORDER — SODIUM CHLORIDE 0.9 % IV SOLN
INTRAVENOUS | Status: DC
  Administered 2015-12-10 – 2015-12-12 (×2): via INTRAVENOUS

## 2015-12-10 MED ORDER — DONEPEZIL HCL 10 MG PO TABS
10.0000 mg | ORAL_TABLET | Freq: Every day | ORAL | Status: DC
  Administered 2015-12-10 – 2015-12-12 (×3): 10 mg via ORAL
  Filled 2015-12-10 (×4): qty 1

## 2015-12-10 MED ORDER — TAMSULOSIN HCL 0.4 MG PO CAPS
0.4000 mg | ORAL_CAPSULE | Freq: Every day | ORAL | Status: DC
  Administered 2015-12-10 – 2015-12-12 (×3): 0.4 mg via ORAL
  Filled 2015-12-10 (×3): qty 1

## 2015-12-10 MED ORDER — DEXTROSE 5 % IV SOLN
1.0000 g | INTRAVENOUS | Status: DC
  Administered 2015-12-10: 1 g via INTRAVENOUS
  Filled 2015-12-10 (×2): qty 10

## 2015-12-10 MED ORDER — INSULIN ASPART 100 UNIT/ML ~~LOC~~ SOLN
0.0000 [IU] | Freq: Three times a day (TID) | SUBCUTANEOUS | Status: DC
Start: 2015-12-11 — End: 2015-12-13
  Administered 2015-12-11: 2 [IU] via SUBCUTANEOUS
  Administered 2015-12-12 – 2015-12-13 (×2): 1 [IU] via SUBCUTANEOUS

## 2015-12-10 MED ORDER — GABAPENTIN 100 MG PO CAPS
100.0000 mg | ORAL_CAPSULE | Freq: Two times a day (BID) | ORAL | Status: DC
  Administered 2015-12-10 – 2015-12-13 (×6): 100 mg via ORAL
  Filled 2015-12-10 (×6): qty 1

## 2015-12-10 MED ORDER — SODIUM CHLORIDE 0.9 % IV BOLUS (SEPSIS)
500.0000 mL | Freq: Once | INTRAVENOUS | Status: AC
  Administered 2015-12-10: 500 mL via INTRAVENOUS

## 2015-12-10 MED ORDER — SIMVASTATIN 40 MG PO TABS
40.0000 mg | ORAL_TABLET | Freq: Every day | ORAL | Status: DC
  Administered 2015-12-10 – 2015-12-12 (×3): 40 mg via ORAL
  Filled 2015-12-10 (×3): qty 1

## 2015-12-10 MED ORDER — ONDANSETRON HCL 4 MG/2ML IJ SOLN: 4.0000 mg | Freq: Four times a day (QID) | INTRAMUSCULAR | Status: DC | PRN

## 2015-12-10 MED ORDER — TRAMADOL HCL 50 MG PO TABS: 50.0000 mg | ORAL_TABLET | Freq: Four times a day (QID) | ORAL | Status: DC | PRN

## 2015-12-10 NOTE — H&P (Signed)
[facility] History and Physical  Patrick Boyle [id number] DOB: Jul 18, 1934 DOA: 12/10/2015  Referring physician: EDP PCP: Orpah Melter, MD   Chief Complaint: weakness  HPI: Patrick Boyle is a 82 y.o. male With past medical history of CAD/CABG, ischemic cardiomyopathy s/p AICD, diabetes on insulin, CKD 3, BPH requiring intermittent self-catheterization, approximately every night presents to the ER with the above complaint. Patient reports feeling weak since today, he also felt feverish and had some chills. Some confusion reported per EMS report, not noted at this time.  He denies any cough congestion shortness of breath, abdominal pain, diarrhea. He noted slight foul odor of his urine. In the emergency room he was noted to have a low-grade fever of 100.2, creatinine up to 2.2 from a baseline of 2.5 and a normal lactate.   Review of Systems:  positives bolded  Constitutional:  No weight loss, night sweats, Fevers, chills, fatigue.  HEENT:  No headaches, Difficulty swallowing,Tooth/dental problems,Sore throat,  No sneezing, itching, ear ache, nasal congestion, post nasal drip,  Cardio-vascular:  No chest pain, Orthopnea, PND, swelling in lower extremities, anasarca, dizziness, palpitations  GI:  No heartburn, indigestion, abdominal pain, nausea, vomiting, diarrhea, change in bowel habits, loss of appetite  Resp:  No shortness of breath with exertion or at rest. No excess mucus, no productive cough, No non-productive cough, No coughing up of blood.No change in color of mucus.No wheezing.No chest wall deformity  Skin:  no rash or lesions.  GU:  no dysuria, change in color of urine, no urgency or frequency. No flank pain.  Musculoskeletal:  No joint pain or swelling. No decreased range of motion. No back pain.  Psych:  No change in mood or affect. No depression or anxiety. No memory loss.   Past Medical History  Diagnosis Date  . Hyperlipidemia   . Hypertension   .  Coronary artery disease     s/p CABG 1997  . GERD (gastroesophageal reflux disease)   . Arthritis   . Allergic rhinitis   . Hyperplastic colon polyp   . Diverticulitis, colon   . Meniere's syndrome   . Tremor, essential     dr Jannifer Franklin- bengin   . COPD, mild ([place])     dr clance  . Ischemic cardiomyopathy     sp ICD ([facility]) implanted by Dr Rayann Heman  . MI (myocardial infarction) ([place]) 1997, 2005  . AICD (automatic cardioverter/defibrillator) present   . Presence of permanent cardiac pacemaker   . CHF (congestive heart failure) ([place])   . Diabetes mellitus     dr Olen Pel- Type II  . Hypothyroidism   . Dementia   . Chronic kidney disease   . Self-catheterizes urinary bladder     bid-   . History of blood transfusion   . Anemia   . HOH (hard of hearing)    Past Surgical History  Procedure Laterality Date  . Coronary artery bypass graft  [place]  . Cardiac catheterization  2005    medical therapy  . Cardiac defibrillator placement      ICD implanted by Dr Rayann Heman, Analyze ST study patient  . Esophagogastroduodenoscopy N/A 08/25/2014    Procedure: ESOPHAGOGASTRODUODENOSCOPY (EGD);  Surgeon: Lear Ng, MD;  Location: [facility];  Service: Endoscopy;  Laterality: N/A;  . Colonoscopy N/A 08/25/2014    Procedure: COLONOSCOPY;  Surgeon: Lear Ng, MD;  Location: [facility];  Service: Endoscopy;  Laterality: N/A;  . Eye surgery Bilateral  cartaract  . Joint replacement Right 2004  . Av fistula placement Right 05/01/2015    Procedure: RIGHT RADIOCEPHALIC ARTERIOVENOUS (AV) FISTULA CREATION;  Surgeon: Elam Dutch, MD;  Location: [facility];  Service: Vascular;  Laterality: Right;   Social History:  reports that he quit smoking about 20 years ago. His smoking use included Cigarettes. He quit after 35 years of use. He does not have any smokeless tobacco history on file. He reports that he does not drink alcohol or use illicit drugs.  Allergies  Allergen Reactions    . Metformin And Related Other (See Comments)    GI sensitivity with high doses  . Ramipril Cough    Cough    Family History  Problem Relation Age of Onset  . Parkinsonism Father   . Stomach cancer Mother   . Diabetes Mother     Prior to Admission medications   Medication Sig Start Date End Date Taking? Authorizing Provider  Acetaminophen (ARTHRITIS PAIN RELIEF PO) Take 650 mg by mouth 2 (two) times daily.    Yes Historical Provider, MD  aspirin EC 81 MG tablet Take 81 mg by mouth daily.   Yes Historical Provider, MD  bimatoprost (LUMIGAN) 0.03 % ophthalmic solution Place 1 drop into both eyes at bedtime.    Yes Historical Provider, MD  calcitRIOL (ROCALTROL) 0.25 MCG capsule Take 0.25 mcg by mouth daily.   Yes Historical Provider, MD  cetirizine (ZYRTEC) 10 MG tablet Take 5 mg by mouth daily.     Yes Historical Provider, MD  cholecalciferol (VITAMIN D) 1000 UNITS tablet Take 1,000 Units by mouth daily.   Yes Historical Provider, MD  cyanocobalamin 500 MCG tablet Take 500 mcg by mouth daily.   Yes Historical Provider, MD  donepezil (ARICEPT) 10 MG tablet Take 10 mg by mouth at bedtime.   Yes Historical Provider, MD  fish oil-omega-3 fatty acids 1000 MG capsule Take 2 g by mouth 2 (two) times daily.    Yes Historical Provider, MD  furosemide (LASIX) 20 MG tablet TAKE 1 TABLET EVERY OTHER DAY ORALLY FOR 30 11/19/14  Yes Historical Provider, MD  gabapentin (NEURONTIN) 300 MG capsule Take 300 mg by mouth 2 (two) times daily.    Yes Historical Provider, MD  Insulin Glargine (TOUJEO SOLOSTAR) 300 UNIT/ML SOPN Inject 30 Units into the skin daily.   Yes Historical Provider, MD  lansoprazole (PREVACID) 30 MG capsule Take 30 mg by mouth daily as needed (for acid reflex).    Yes Historical Provider, MD  levothyroxine (SYNTHROID, LEVOTHROID) 50 MCG tablet Take 50 mcg by mouth daily before breakfast.   Yes Historical Provider, MD  ONE TOUCH ULTRA TEST test strip TEST 1 TIME DAILY. 01/07/15  Yes  Historical Provider, MD  simvastatin (ZOCOR) 40 MG tablet Take 40 mg by mouth at bedtime.     Yes Historical Provider, MD  sitaGLIPtin (JANUVIA) 50 MG tablet Take 25 mg by mouth daily with breakfast.    Yes Historical Provider, MD  sodium bicarbonate 650 MG tablet Take 1 tablet (650 mg total) by mouth 2 (two) times daily. 08/12/14  Yes Orson Eva, MD  tamsulosin (FLOMAX) 0.4 MG CAPS capsule Take 1 capsule (0.4 mg total) by mouth daily after supper. 08/12/14  Yes Orson Eva, MD  traMADol (ULTRAM) 50 MG tablet Take 1 tablet by mouth every 6 (six) hours as needed (pain). For 10 days 08/12/14  Yes Historical Provider, MD  MECLIZINE HCL PO Take 5 mg by mouth daily as needed (vertigo).  Reported on 12/10/2015    Historical Provider, MD   Physical Exam: Filed Vitals:   12/10/15 1630 12/10/15 1715 12/10/15 1750 12/10/15 1800  BP: 133/54 134/54 115/60 117/50  Pulse: 79 76 64 69  Temp:      TempSrc:      Resp: 20 17 20 18   Height:      Weight:      SpO2: 95% 100% 99% 96%    Wt Readings from Last 3 Encounters:  12/10/15 84.369 kg (186 lb)  08/01/15 84.369 kg (186 lb)  07/23/15 83.008 kg (183 lb)    General:  Appears calm and comfortable, AAOX3, no distress Eyes: PERRL, normal lids, irises & conjunctiva ENT: grossly normal hearing, lips & tongue Neck: no LAD, masses or thyromegaly Cardiovascular: RRR, no m/r/g. No LE edema. Telemetry: SR, no arrhythmias  Respiratory: CTA bilaterally, no w/r/r. Normal respiratory effort. Abdomen: soft, ntnd, BS present Skin: no rash or induration seen on limited exam Musculoskeletal: grossly normal tone BUE/BLE Psychiatric: grossly normal mood and affect, speech fluent and appropriate Neurologic: grossly non-focal.          Labs on Admission:  Basic Metabolic Panel:  Recent Labs Lab 12/10/15 1555  NA 141  K 5.1  CL 108  CO2 22  GLUCOSE 154*  BUN 58*  CREATININE 3.27*  CALCIUM 9.2   Liver Function Tests:  Recent Labs Lab 12/10/15 1555  AST  18  ALT 15*  ALKPHOS 51  BILITOT 0.9  PROT 6.3*  ALBUMIN 3.4*   No results for input(s): LIPASE, AMYLASE in the last 168 hours. No results for input(s): AMMONIA in the last 168 hours. CBC:  Recent Labs Lab 12/10/15 1555  WBC 9.6  NEUTROABS 8.7*  HGB 11.9*  HCT 35.1*  MCV 93.6  PLT 120*   Cardiac Enzymes: No results for input(s): CKTOTAL, CKMB, CKMBINDEX, TROPONINI in the last 168 hours.  BNP (last 3 results) No results for input(s): BNP in the last 8760 hours.  ProBNP (last 3 results) No results for input(s): PROBNP in the last 8760 hours.  CBG: No results for input(s): GLUCAP in the last 168 hours.  Radiological Exams on Admission: No results found.  Assessment/Plan   Sepsis secondary to UTI -Admit to MedSurg -Lactate reassuring,  -Hydrate with normal saline at 75 mL an hour, caution with excessive IVF due to cardiomyopathy -IV ceftriaxone, follow urine and blood cultures    Diabetes mellitus (HCC) -Continue Lantus at lower dose, sliding scale insulin    Essential hypertension -Stable, monitor    CAD s/p CABG/ischemic cardiomyopathy -Status post AICD -EF unknown  -Continue aspirin, not on beta blockers due to orthostatic symptoms per cardiology notes, hold Lasix at this time     AKI on CK D3  -suspect this is pre-renal due to poor Po and lasix, but could have mild ATN from sepsis -baseline creatinine around 2.5 -hydrate, hold lasix, monitor -cut down gabapentin dose in setting of AKI to prevent retention  BPH -self catheterizes every night, -will place foley and leave it in when he needs to be catheterizes next  Code Status: DO NOT RESUSCITATE DVT Prophylaxis: Lovenox Family Communication: no family at bedside, plan of care discussed the patient Disposition Plan: inpatient, likely 2days  Time spent: 73min  Eusebia Grulke [facility] Pager [phone]

## 2015-12-10 NOTE — ED Notes (Signed)
Per ems pt was sent from home for an altered mental status that started around 10am. Pt is alert and oriented to person and place. Pt is warm to touch. Denies any pain or discomfort.

## 2015-12-10 NOTE — ED Provider Notes (Signed)
CSN: [id number]     Arrival date & time 12/10/15  1531 History   First MD Initiated Contact with Patient 12/10/15 1540     Chief Complaint  Patient presents with  . Altered Mental Status     (Consider location/radiation/quality/duration/timing/severity/associated sxs/prior Treatment) Patient is a 82 y.o. male presenting with altered mental status. The history is provided by the patient.  Altered Mental Status Presenting symptoms: confusion   Severity:  Moderate Most recent episode:  Today Episode history:  Single Timing:  Constant Progression:  Worsening Chronicity:  New Context: not a nursing home resident   Associated symptoms: fever and nausea   Associated symptoms: no abdominal pain, no light-headedness, no rash, no vomiting and no weakness     Past Medical History  Diagnosis Date  . Hyperlipidemia   . Hypertension   . Coronary artery disease     s/p CABG 1997  . GERD (gastroesophageal reflux disease)   . Arthritis   . Allergic rhinitis   . Hyperplastic colon polyp   . Diverticulitis, colon   . Meniere's syndrome   . Tremor, essential     dr Jannifer Franklin- bengin   . COPD, mild ([place])     dr clance  . Ischemic cardiomyopathy     sp ICD ([facility]) implanted by Dr Rayann Heman  . MI (myocardial infarction) ([place]) 1997, 2005  . AICD (automatic cardioverter/defibrillator) present   . Presence of permanent cardiac pacemaker   . CHF (congestive heart failure) ([place])   . Diabetes mellitus     dr Olen Pel- Type II  . Hypothyroidism   . Dementia   . Chronic kidney disease   . Self-catheterizes urinary bladder     bid-   . History of blood transfusion   . Anemia   . HOH (hard of hearing)    Past Surgical History  Procedure Laterality Date  . Coronary artery bypass graft  [place]  . Cardiac catheterization  2005    medical therapy  . Cardiac defibrillator placement      ICD implanted by Dr Rayann Heman, Analyze ST study patient  . Esophagogastroduodenoscopy N/A 08/25/2014   Procedure: ESOPHAGOGASTRODUODENOSCOPY (EGD);  Surgeon: Lear Ng, MD;  Location: [facility];  Service: Endoscopy;  Laterality: N/A;  . Colonoscopy N/A 08/25/2014    Procedure: COLONOSCOPY;  Surgeon: Lear Ng, MD;  Location: [facility];  Service: Endoscopy;  Laterality: N/A;  . Eye surgery Bilateral     cartaract  . Joint replacement Right 2004  . Av fistula placement Right 05/01/2015    Procedure: RIGHT RADIOCEPHALIC ARTERIOVENOUS (AV) FISTULA CREATION;  Surgeon: Elam Dutch, MD;  Location: [facility] OR;  Service: Vascular;  Laterality: Right;   Family History  Problem Relation Age of Onset  . Parkinsonism Father   . Stomach cancer Mother   . Diabetes Mother    Social History  Substance Use Topics  . Smoking status: Former Smoker -- 35 years    Types: Cigarettes    Quit date: 09/01/1995  . Smokeless tobacco: None  . Alcohol Use: No     Comment: occasional scotch    Review of Systems  Constitutional: Positive for fever, chills and fatigue.  HENT: Negative for congestion and rhinorrhea.   Eyes: Negative for visual disturbance.  Respiratory: Negative for cough, shortness of breath and wheezing.   Cardiovascular: Negative for chest pain and leg swelling.  Gastrointestinal: Positive for nausea. Negative for vomiting, abdominal pain and diarrhea.  Genitourinary: Positive for dysuria and frequency.  Negative for flank pain, penile swelling and penile pain.  Musculoskeletal: Negative for neck pain.  Skin: Negative for rash.  Neurological: Negative for syncope, weakness and light-headedness.  Psychiatric/Behavioral: Positive for confusion.      Allergies  Metformin and related and Ramipril  Home Medications   Prior to Admission medications   Medication Sig Start Date End Date Taking? Authorizing Provider  Acetaminophen (ARTHRITIS PAIN RELIEF PO) Take 650 mg by mouth 2 (two) times daily.     Historical Provider, MD  aspirin EC 81 MG tablet Take 81 mg by  mouth daily.    Historical Provider, MD  bimatoprost (LUMIGAN) 0.03 % ophthalmic solution Place 1 drop into both eyes at bedtime.     Historical Provider, MD  calcitRIOL (ROCALTROL) 0.25 MCG capsule Take 0.25 mcg by mouth daily.    Historical Provider, MD  cetirizine (ZYRTEC) 10 MG tablet Take 5 mg by mouth daily.      Historical Provider, MD  cholecalciferol (VITAMIN D) 1000 UNITS tablet Take 1,000 Units by mouth daily.    Historical Provider, MD  cyanocobalamin 500 MCG tablet Take 500 mcg by mouth daily.    Historical Provider, MD  donepezil (ARICEPT) 10 MG tablet Take 10 mg by mouth at bedtime.    Historical Provider, MD  fish oil-omega-3 fatty acids 1000 MG capsule Take 2 g by mouth 2 (two) times daily.     Historical Provider, MD  furosemide (LASIX) 20 MG tablet TAKE 1 TABLET EVERY OTHER DAY ORALLY FOR 30 11/19/14   Historical Provider, MD  gabapentin (NEURONTIN) 300 MG capsule Take 300 mg by mouth 3 (three) times daily.    Historical Provider, MD  Insulin Glargine (TOUJEO SOLOSTAR) 300 UNIT/ML SOPN Inject 30 Units into the skin daily.    Historical Provider, MD  lansoprazole (PREVACID) 30 MG capsule Take 30 mg by mouth daily as needed (for acid reflex).     Historical Provider, MD  levothyroxine (SYNTHROID, LEVOTHROID) 50 MCG tablet Take 50 mcg by mouth daily before breakfast.    Historical Provider, MD  MECLIZINE HCL PO Take 5 mg by mouth daily as needed (vertigo).    Historical Provider, MD  ONE TOUCH ULTRA TEST test strip TEST 1 TIME DAILY. 01/07/15   Historical Provider, MD  simvastatin (ZOCOR) 40 MG tablet Take 40 mg by mouth at bedtime.      Historical Provider, MD  sitaGLIPtin (JANUVIA) 50 MG tablet Take 25 mg by mouth daily with breakfast.     Historical Provider, MD  sodium bicarbonate 650 MG tablet Take 1 tablet (650 mg total) by mouth 2 (two) times daily. 08/12/14   Orson Eva, MD  tamsulosin (FLOMAX) 0.4 MG CAPS capsule Take 1 capsule (0.4 mg total) by mouth daily after supper.  08/12/14   Orson Eva, MD  traMADol (ULTRAM) 50 MG tablet Take 1 tablet by mouth every 6 (six) hours as needed (pain). For 10 days 08/12/14   Historical Provider, MD   BP 140/80 mmHg  Pulse 79  Temp(Src) 100.2 F (37.9 C) (Oral)  Resp 16  Ht 6' (1.829 m)  Wt 84.369 kg  BMI 25.22 kg/m2  SpO2 99% Physical Exam  Constitutional: He is oriented to person, place, and time. He appears well-developed and well-nourished. No distress.  HENT:  Head: Normocephalic.  Mouth/Throat: No oropharyngeal exudate.  Eyes: Pupils are equal, round, and reactive to light.  Neck: Normal range of motion. Neck supple.  Cardiovascular: Normal rate, regular rhythm, normal heart sounds and intact distal  pulses.   Pulmonary/Chest: Effort normal. No respiratory distress. He has no wheezes. He has no rales.  Abdominal: Soft. He exhibits no distension. There is no rebound (mild suprapubic) and no guarding.  Musculoskeletal: He exhibits no edema.  Neurological: He is alert and oriented to person, place, and time.  Skin: Skin is warm. No rash noted.  Nursing note and vitals reviewed.   ED Course  Procedures (including critical care time) Labs Review Labs Reviewed  COMPREHENSIVE METABOLIC PANEL - Abnormal; Notable for the following:    Glucose, Bld 154 (*)    BUN 58 (*)    Creatinine, Ser 3.27 (*)    Total Protein 6.3 (*)    Albumin 3.4 (*)    ALT 15 (*)    GFR calc non Af Amer 17 (*)    GFR calc Af Amer 19 (*)    All other components within normal limits  CBC WITH DIFFERENTIAL/PLATELET - Abnormal; Notable for the following:    RBC 3.75 (*)    Hemoglobin 11.9 (*)    HCT 35.1 (*)    Platelets 120 (*)    Neutro Abs 8.7 (*)    Lymphs Abs 0.5 (*)    All other components within normal limits  URINALYSIS, ROUTINE W REFLEX MICROSCOPIC (NOT AT [facility]) - Abnormal; Notable for the following:    APPearance TURBID (*)    Hgb urine dipstick MODERATE (*)    Leukocytes, UA LARGE (*)    All other components within  normal limits  URINE MICROSCOPIC-ADD ON - Abnormal; Notable for the following:    Squamous Epithelial / LPF 0-5 (*)    Bacteria, UA MANY (*)    All other components within normal limits  GLUCOSE, CAPILLARY - Abnormal; Notable for the following:    Glucose-Capillary 240 (*)    All other components within normal limits  CULTURE, BLOOD (ROUTINE X 2)  CULTURE, BLOOD (ROUTINE X 2)  URINE CULTURE  PROCALCITONIN  PROTIME-INR  APTT  LACTIC ACID, PLASMA  CBC  COMPREHENSIVE METABOLIC PANEL  I-STAT CG4 LACTIC ACID, ED  I-STAT CG4 LACTIC ACID, ED    Imaging Review Dg Chest Portable 1 View  12/10/2015  CLINICAL DATA:  Altered mental status beginning at 1000 hours, fever EXAM: PORTABLE CHEST 1 VIEW COMPARISON:  Portable exam 1904 hours without priors for comparison FINDINGS: LEFT subclavian transvenous AICD lead tip projects over RIGHT ventricle. Enlargement of cardiac silhouette post median sternotomy. Calcified tortuous thoracic aorta. Lungs grossly clear. No definite pleural effusion or pneumothorax. IMPRESSION: Enlargement of cardiac silhouette post median sternotomy and AICD placement. No acute abnormalities. Electronically Signed   By: Lavonia Dana M.D.   On: 12/10/2015 19:41   I have personally reviewed and evaluated these images and lab results as part of my medical decision-making.   EKG Interpretation None      MDM   82 year old male with past medical history as above who presents with a one-day history of fever and altered mental status. Patient is alert and oriented to person and place with no focal neurological deficits. He is febrile on arrival but otherwise hemodynamically stable without hypotension. Primary suspicion at this time is likely complicated, catheter associated UTI given history of intermittent self cathing. Must also consider occult pneumonia viral infection or other infectious etiology. Patient is without signs of severe sepsis or shock. Will send broad labs including  urinalysis from a cath sample. Otherwise will give gentle fluid bolus given the patient's history of congestive heart failure as mentioned,  the patient has no focal neurological deficits to suggest intracranial process such as CVA. No headache, photophobia, neck stiffness or signs of meningitis or encephalitis.  Labs and imaging reviewed as above CBC shows no leukocytosis. CMP shows acute on chronic kidney injury with BUN of 58 and creatinine 3.27. Lites are otherwise acceptable and the patient has a normal anion gap. Urinalysis with 2 minute to numerous to count white blood cells and many bacteria consistent with UTI. Given catheter associated UTI, will start Zosyn, send cultures, and plan for admission  Clinical Impression: 1. Complicated UTI (urinary tract infection)   2. Delirium     Disposition: Admit  Condition: Stable  Pt seen in conjunction with Dr. Andrey Cota, MD 12/11/15 [id number]  Dorie Rank, MD 12/12/15 2149

## 2015-12-10 NOTE — Progress Notes (Signed)
NURSING PROGRESS NOTE  Patrick Boyle [id number] Admission Data: 12/10/2015 7:43 PM Attending Provider: Domenic Polite, MD [id number], Annie Main, MD Code Status: DNR  Patrick Boyle is a 82 y.o. male patient admitted from ED:  -No acute distress noted.  -No complaints of shortness of breath.  -No complaints of chest pain.    Blood pressure 111/46, pulse 66, temperature 98.2 F (36.8 C), temperature source Oral, resp. rate 16, height 5\' 10"  (1.778 m), weight 83.643 kg (184 lb 6.4 oz), SpO2 98 %.   IV Fluids:  IV in place, occlusive dsg intact without redness, IV cath wrist and forearm right, condition patent and no redness normal saline.   Allergies:  Metformin and related and Ramipril  Past Medical History:   has a past medical history of Hyperlipidemia; Hypertension; Coronary artery disease; GERD (gastroesophageal reflux disease); Arthritis; Allergic rhinitis; Hyperplastic colon polyp; Diverticulitis, colon; Meniere's syndrome; Tremor, essential; COPD, mild ([place]); Ischemic cardiomyopathy; MI (myocardial infarction) ([place]) (1997, 2005); AICD (automatic cardioverter/defibrillator) present; Presence of permanent cardiac pacemaker; CHF (congestive heart failure) ([place]); Diabetes mellitus; Hypothyroidism; Dementia; Chronic kidney disease; Self-catheterizes urinary bladder; History of blood transfusion; Anemia; and HOH (hard of hearing).  Past Surgical History:   has past surgical history that includes Coronary artery bypass graft (1997); Cardiac catheterization (2005); Cardiac defibrillator placement; Esophagogastroduodenoscopy (N/A, 08/25/2014); Colonoscopy (N/A, 08/25/2014); Eye surgery (Bilateral); Joint replacement (Right, 2004); and AV fistula placement (Right, 05/01/2015).  Social History:   reports that he quit smoking about 20 years ago. His smoking use included Cigarettes. He quit after 35 years of use. He does not have any smokeless tobacco history on file. He reports that he does not drink  alcohol or use illicit drugs.  Skin: Intact  Patient/Family orientated to room. Information packet given to patient/family. Admission inpatient armband information verified with patient/family to include name and date of birth and placed on patient arm. Side rails up x 2, fall assessment and education completed with patient/family. Patient/family able to verbalize understanding of risk associated with falls and verbalized understanding to call for assistance before getting out of bed. Call light within reach. Patient/family able to voice and demonstrate understanding of unit orientation instructions.    Will continue to evaluate and treat per MD orders.

## 2015-12-11 DIAGNOSIS — Z95810 Presence of automatic (implantable) cardiac defibrillator: Secondary | ICD-10-CM

## 2015-12-11 DIAGNOSIS — E1165 Type 2 diabetes mellitus with hyperglycemia: Secondary | ICD-10-CM

## 2015-12-11 DIAGNOSIS — E1365 Other specified diabetes mellitus with hyperglycemia: Secondary | ICD-10-CM

## 2015-12-11 DIAGNOSIS — Z794 Long term (current) use of insulin: Secondary | ICD-10-CM

## 2015-12-11 DIAGNOSIS — I5022 Chronic systolic (congestive) heart failure: Secondary | ICD-10-CM

## 2015-12-11 DIAGNOSIS — D696 Thrombocytopenia, unspecified: Secondary | ICD-10-CM | POA: Diagnosis present

## 2015-12-11 DIAGNOSIS — N184 Chronic kidney disease, stage 4 (severe): Secondary | ICD-10-CM

## 2015-12-11 DIAGNOSIS — N390 Urinary tract infection, site not specified: Secondary | ICD-10-CM

## 2015-12-11 DIAGNOSIS — A419 Sepsis, unspecified organism: Secondary | ICD-10-CM

## 2015-12-11 DIAGNOSIS — E1321 Other specified diabetes mellitus with diabetic nephropathy: Secondary | ICD-10-CM

## 2015-12-11 DIAGNOSIS — E1121 Type 2 diabetes mellitus with diabetic nephropathy: Secondary | ICD-10-CM

## 2015-12-11 DIAGNOSIS — IMO0002 Reserved for concepts with insufficient information to code with codable children: Secondary | ICD-10-CM

## 2015-12-11 DIAGNOSIS — E038 Other specified hypothyroidism: Secondary | ICD-10-CM

## 2015-12-11 DIAGNOSIS — E039 Hypothyroidism, unspecified: Secondary | ICD-10-CM | POA: Diagnosis present

## 2015-12-11 LAB — COMPREHENSIVE METABOLIC PANEL
ALT: 11 U/L — ABNORMAL LOW (ref 17–63)
ANION GAP: 11 (ref 5–15)
AST: 16 U/L (ref 15–41)
Albumin: 2.8 g/dL — ABNORMAL LOW (ref 3.5–5.0)
Alkaline Phosphatase: 43 U/L (ref 38–126)
BUN: 51 mg/dL — ABNORMAL HIGH (ref 6–20)
CHLORIDE: 108 mmol/L (ref 101–111)
CO2: 21 mmol/L — AB (ref 22–32)
Calcium: 8.2 mg/dL — ABNORMAL LOW (ref 8.9–10.3)
Creatinine, Ser: 3.01 mg/dL — ABNORMAL HIGH (ref 0.61–1.24)
GFR calc Af Amer: 21 mL/min — ABNORMAL LOW (ref >60)
GFR, EST NON AFRICAN AMERICAN: 18 mL/min — AB (ref >60)
Glucose, Bld: 163 mg/dL — ABNORMAL HIGH (ref 65–99)
POTASSIUM: 4.7 mmol/L (ref 3.5–5.1)
SODIUM: 140 mmol/L (ref 135–145)
Total Bilirubin: 0.8 mg/dL (ref 0.3–1.2)
Total Protein: 5.6 g/dL — ABNORMAL LOW (ref 6.5–8.1)

## 2015-12-11 LAB — CBC
HEMATOCRIT: 33 % — AB (ref 39.0–52.0)
Hemoglobin: 10.6 g/dL — ABNORMAL LOW (ref 13.0–17.0)
MCH: 31.1 pg (ref 26.0–34.0)
MCHC: 32.1 g/dL (ref 30.0–36.0)
MCV: 96.8 fL (ref 78.0–100.0)
PLATELETS: 100 10*3/uL — AB (ref 150–400)
RBC: 3.41 MIL/uL — AB (ref 4.22–5.81)
RDW: 13.6 % (ref 11.5–15.5)
WBC: 9.8 10*3/uL (ref 4.0–10.5)

## 2015-12-11 LAB — GLUCOSE, CAPILLARY
GLUCOSE-CAPILLARY: 118 mg/dL — AB (ref 65–99)
GLUCOSE-CAPILLARY: 103 mg/dL — AB (ref 65–99)
GLUCOSE-CAPILLARY: 124 mg/dL — AB (ref 65–99)
Glucose-Capillary: 169 mg/dL — ABNORMAL HIGH (ref 65–99)

## 2015-12-11 MED ORDER — LOPERAMIDE HCL 2 MG PO CAPS
2.0000 mg | ORAL_CAPSULE | Freq: Three times a day (TID) | ORAL | Status: DC | PRN
  Administered 2015-12-11 – 2015-12-13 (×2): 2 mg via ORAL
  Filled 2015-12-11 (×2): qty 1

## 2015-12-11 MED ORDER — DEXTROSE 5 % IV SOLN
2.0000 g | INTRAVENOUS | Status: DC
  Administered 2015-12-11 – 2015-12-12 (×2): 2 g via INTRAVENOUS
  Filled 2015-12-11 (×3): qty 2

## 2015-12-11 MED ORDER — VANCOMYCIN HCL 10 G IV SOLR
1750.0000 mg | Freq: Once | INTRAVENOUS | Status: AC
  Administered 2015-12-11: 1750 mg via INTRAVENOUS
  Filled 2015-12-11: qty 1750

## 2015-12-11 MED ORDER — VANCOMYCIN HCL IN DEXTROSE 1-5 GM/200ML-% IV SOLN
1000.0000 mg | INTRAVENOUS | Status: DC
  Filled 2015-12-11: qty 200

## 2015-12-11 NOTE — Progress Notes (Signed)
Patient ID: Patrick Boyle, male   DOB: 08-26-1934, 82 y.o.   MRN: [id number] [facility] PROGRESS NOTE  TRAVER MECKES [id number] DOB: 1934-01-08 DOA: 12/10/2015 PCP: Orpah Melter, MD  Brief narrative:    82 y.o. male with past medical history of CAD/CABG, ischemic cardiomyopathy s/p AICD, diabetes on insulin, CKD stage 4 (GFR 15 in 07/2014), BPH requiring intermittent self-catheterization who presented to [facility] with weakness, fevers and chills for past 24 hours prior to this admission in addition to intermittent confusion reported by EMS.  On admission, patient was hemodynamically stable. Tmax was 100.2 F, creatinine was 3.27 (baseline in 07/2014 was 3.48). Urinalysis demonstrated large leukocytes and many bacteria. Lactic acid was within normal limits. He was started on Rocephin while awaiting urine and blood culture results.  Assessment/Plan:    Principal problem: Sepsis secondary to urinary tract infection without hematuria in patient with intermittent self-catheterization - sepsis criteria met on the admission with fever, tachypnea, source of infection UTI. Pro calcitonin was elevated at 4.63, lactic acid was WNL - Urinalysis on the admission showed large leukocytes, many bacteria and too numerous to count white blood cells - Patient was started on Rocephin while awaiting urine culture results. Urine culture is still pending.  Active Problems: Gram-negative rod bacteremia - Blood cultures on the admission are showing gram-negative rods, final report pending - We will obtain repeat blood cultures tomorrow to ensure resolution of bacteremia - Continue Rocephin for now  Uncontrolled type 2 diabetes mellitus with diabetic nephropathy with long-term insulin use - A1c in December 2015 was 7.6 - Patient is on insulin at home and currently he is on Lantus15 units  Daily as well as sliding scale insulin - At home he also takes Sitagliptin   Diabetic neuropathy -  Continue gabapentin  Dyslipidemia associated with type 2 diabetes mellitus - Continue simvastatin 40 mg at bedtime  Hypothyroidism - Continue Synthroid 50 g daily  Chronic systolic congestive heart failure (HCC) / Automatic implantable cardioverter-defibrillator in situ - Last 2-D echo in 2011 showed ejection fraction of 25-30% with inferoposterior and apical akinesis - Respiratory status stable - Continue daily aspirin  Chronic kidney disease stage IV GFR 17 ml/min - Baseline creatinine in December 2015 was 3.48 and on this admission 3.27 so around the baseline values - He is on Lasix at home which was held at the time of the admission - we will continue to monitor renal function and hopefully improves further.  Thrombocytopenia - Platelet count 120 - Possibly from sepsis - Stop Lovenox and use SCDs for DVT prophylaxis - Monitor for bleeding  DVT Prophylaxis  - Continue Lovenox subcutaneous   Code Status: DNR/DNI  Family Communication:  plan of care discussed with the patient Disposition Plan: home once blood in urine culture results back  IV access:  Peripheral IV  Procedures and diagnostic studies:    Dg Chest Portable 1 View 12/10/2015 Enlargement of cardiac silhouette post median sternotomy and AICD placement. No acute abnormalities. Electronically Signed   By: Lavonia Dana M.D.   On: 12/10/2015 19:41   Medical Consultants:  None  Other Consultants:  None  IAnti-Infectives:   Rocephin 12/10/2015 -->   Leisa [name], MD  [facility] Pager [phone]  Time spent in minutes: 25 minutes  If 7PM-7AM, please contact night-coverage [email or web address] Password [email or web address] 12/11/2015, 10:06 AM   LOS: 1 day    HPI/Subjective: No acute overnight events. Patient reports he feels better.   Objective: Filed Vitals:  12/10/15 1800 12/10/15 1830 12/10/15 1934 12/11/15 0520  BP: 117/50 123/50 111/46 124/49  Pulse: 69 69 66 68  Temp:   98.2 F (36.8 C) 97.7 F (36.5  C)  TempSrc:   Oral Oral  Resp: '18 14 16 16  ' Height:   '5\' 10"'  (1.778 m)   Weight:   83.643 kg (184 lb 6.4 oz)   SpO2: 96% 98% 98% 97%    Intake/Output Summary (Last 24 hours) at 12/11/15 1006 Last data filed at 12/11/15 0956  Gross per 24 hour  Intake   1130 ml  Output   1065 ml  Net     65 ml    Exam:   General:  Pt is alert, follows commands appropriately, not in acute distress  Cardiovascular: Regular rate and rhythm, S1/S2 appreciated   Respiratory: Clear to auscultation bilaterally, no wheezing, no crackles, no rhonchi  Abdomen: Soft, non tender, non distended, bowel sounds present  Extremities: No edema, pulses palpable bilaterally  Neuro: Grossly nonfocal  Data Reviewed: Basic Metabolic Panel:  Recent Labs Lab 12/10/15 1555  NA 141  K 5.1  CL 108  CO2 22  GLUCOSE 154*  BUN 58*  CREATININE 3.27*  CALCIUM 9.2   Liver Function Tests:  Recent Labs Lab 12/10/15 1555  AST 18  ALT 15*  ALKPHOS 51  BILITOT 0.9  PROT 6.3*  ALBUMIN 3.4*   No results for input(s): LIPASE, AMYLASE in the last 168 hours. No results for input(s): AMMONIA in the last 168 hours. CBC:  Recent Labs Lab 12/10/15 1555  WBC 9.6  NEUTROABS 8.7*  HGB 11.9*  HCT 35.1*  MCV 93.6  PLT 120*   Cardiac Enzymes: No results for input(s): CKTOTAL, CKMB, CKMBINDEX, TROPONINI in the last 168 hours. BNP: Invalid input(s): POCBNP CBG:  Recent Labs Lab 12/10/15 2222 12/11/15 0748  GLUCAP 240* 118*    Recent Results (from the past 240 hour(s))  Culture, blood (Routine x 2)     Status: None (Preliminary result)   Collection Time: 12/10/15  3:57 PM  Result Value Ref Range Status   Specimen Description BLOOD LEFT FOREARM  Final   Special Requests BOTTLES DRAWN AEROBIC AND ANAEROBIC 5CC  Final   Culture  Setup Time   Final    GRAM NEGATIVE RODS IN BOTH AEROBIC AND ANAEROBIC BOTTLES CRITICAL RESULT CALLED TO, READ BACK BY AND VERIFIED WITH: M RUDISILL,RN '@0540'  12/11/15  MKELLY    Culture PENDING  Incomplete   Report Status PENDING  Incomplete     Scheduled Meds: . aspirin EC  81 mg Oral Daily  . calcitRIOL  0.25 mcg Oral Daily  . cefTRIAXone (ROCEPHIN)  IV  1 g Intravenous Q24H  . donepezil  10 mg Oral QHS  . enoxaparin (LOVENOX) injection  30 mg Subcutaneous Q24H  . gabapentin  100 mg Oral BID  . insulin aspart  0-9 Units Subcutaneous TID WC  . insulin glargine  15 Units Subcutaneous Daily  . levothyroxine  50 mcg Oral QAC breakfast  . pantoprazole  40 mg Oral Daily  . simvastatin  40 mg Oral QHS  . tamsulosin  0.4 mg Oral QPC supper   Continuous Infusions: . sodium chloride 75 mL/hr at 12/10/15 2136

## 2015-12-11 NOTE — Care Management Note (Signed)
Case Management Note  Patient Details  Name: Patrick Boyle MRN: [id number] Date of Birth: 05/22/1934  Subjective/Objective:                 Spoke with patient at the bedside. Independent patient from home admitted with UTI and mild confusion. Patient has GNR to blood, and will require rpt blood culture per MD. Patient is from home with wife and daughter who lives 1 mile away. Patient uses a cane to walk to the mailbox and a walker at his bedside to steady himself when he gets out of bed in the morning. Patient states he still drives and has no difficulties getting to his PCP in [facility] or the pharmacy to get his meds.    Action/Plan:  CM will continue to follow for DC needs. No needs identified at this time.   Expected Discharge Date:                  Expected Discharge Plan:  Home/Self Care  In-House Referral:     Discharge planning Services  CM Consult  Post Acute Care Choice:    Choice offered to:     DME Arranged:    DME Agency:     HH Arranged:    HH Agency:     Status of Service:  In process, will continue to follow  Medicare Important Message Given:    Date Medicare IM Given:    Medicare IM give by:    Date Additional Medicare IM Given:    Additional Medicare Important Message give by:     If discussed at [place] of Stay Meetings, dates discussed:    Additional Comments:  Carles Collet, RN 12/11/2015, 12:21 PM

## 2015-12-11 NOTE — Progress Notes (Signed)
Pharmacy Antibiotic Note  Patrick Boyle is a 82 y.o. male admitted on 12/10/2015 with bacteremia, possible UTI.  Pharmacy has been consulted for vanc dosing. Also on ceftriaxone (D#2). GPC/GNR in 1/2 BC. AF, WBC wnl, LA 1.52, PCT 4.6. SCr 3.21>3.01, CrCl~20.  Plan: CTX to 2g IV q24h for bacteremia Vanc 1750mg  IV x1; then 1g IV q24h - borderline to adjust to q48h if renal function worsens Monitor clinical progress, c/s, abx plan/LOT VT@SS  as indicated  Height: 5\' 10"  (177.8 cm) Weight: 184 lb 6.4 oz (83.643 kg) IBW/kg (Calculated) : 73  Temp (24hrs), Avg:98.7 F (37.1 C), Min:97.7 F (36.5 C), Max:100.2 F (37.9 C)   Recent Labs Lab 12/10/15 1555 12/10/15 1615 12/10/15 2017 12/11/15 0923  WBC 9.6  --   --   --   CREATININE 3.27*  --   --  3.01*  LATICACIDVEN  --  1.52 1.3  --     Estimated Creatinine Clearance: 20.5 mL/min (by C-G formula based on Cr of 3.01).    Allergies  Allergen Reactions  . Metformin And Related Other (See Comments)    GI sensitivity with high doses  . Ramipril Cough    Cough    Antimicrobials this admission: 4/12 vanc>> 4/11 CTX>> 4/11 zosyn x1  Dose adjustments this admission: n/a  Microbiology results: 4/11 BCx2: GNR 1/2, GPC 1/2 4/11 UC:   Elicia Lamp, PharmD, BCPS Clinical Pharmacist Pager [phone] 12/11/2015 11:42 AM

## 2015-12-11 NOTE — Progress Notes (Signed)
CRITICAL VALUE ALERT  Critical value received: Blood Culture gram positive cocci in clusters   Date of notification: 12/11/2015  Time of notification: 11:30 AM  Critical value read back: yes   Nurse who received alert: Dorita Fray RN   MD notified (1st page): MD Charlies Silvers  Time notified: 11:32AM Face to face

## 2015-12-12 LAB — GLUCOSE, CAPILLARY
GLUCOSE-CAPILLARY: 115 mg/dL — AB (ref 65–99)
Glucose-Capillary: 86 mg/dL (ref 65–99)
Glucose-Capillary: 124 mg/dL — ABNORMAL HIGH (ref 65–99)
Glucose-Capillary: 171 mg/dL — ABNORMAL HIGH (ref 65–99)

## 2015-12-12 MED ORDER — VANCOMYCIN HCL 10 G IV SOLR: 1750.0000 mg | Freq: Once | INTRAVENOUS | Status: DC

## 2015-12-12 NOTE — Progress Notes (Addendum)
Patient ID: Patrick Boyle, male   DOB: 1934/01/13, 82 y.o.   MRN: [id number] [facility] PROGRESS NOTE  Patrick Boyle [id number] DOB: May 12, 1934 DOA: 12/10/2015 PCP: Orpah Melter, MD  Brief narrative:    82 y.o. male with past medical history of CAD/CABG, ischemic cardiomyopathy s/p AICD, diabetes on insulin, CKD stage 4 (GFR 15 in 07/2014), BPH requiring intermittent self-catheterization who presented to [facility] with weakness, fevers and chills for past 24 hours prior to this admission in addition to intermittent confusion reported by EMS.  On admission, patient was hemodynamically stable. Tmax was 100.2 F, creatinine was 3.27 (baseline in 07/2014 was 3.48). Urinalysis demonstrated large leukocytes and many bacteria. Lactic acid was within normal limits. He was started on Rocephin while awaiting urine and blood culture results. He was also on vanco 4/12 and 4/13 until blood culture result available (initially GPC in clusters but then staph coag neg species likely a contaminant).  Assessment/Plan:    Principal problem: Sepsis secondary to urinary tract infection without hematuria in patient with intermittent self-catheterization - Sepsis criteria were met on the admission with fever, tachypnea, source of infection UTI. - Urinalysis on the admission showed large leukocytes, many bacteria and too numerous to count white blood cells  - Procalcitonin was elevated at 4.63, lactic acid was WNL - Continue rocephin for now  - Her urine culture so far showing gram negative rods  Active Problems: Gram-negative rod bacteremia - Blood cultures on the admission are showing gram-negative rods, final report pending (1 of the sets) - Another set if growing staph species coag negative  - We started vanco 4/12 but stopped 4/13 since caog neg species likely a contaminant rather than true infection - Continue Rocephin until we get final culture results   Uncontrolled type 2 diabetes  mellitus with diabetic nephropathy with long-term insulin use - A1c in December 2015 was 7.6 - Continue Lantus 15 units daily and SSI - At home pt also takes Sitagliptin   Diabetic neuropathy - Continue gabapentin  Dyslipidemia associated with type 2 diabetes mellitus - Continue simvastatin 40 mg at bedtime  Hypothyroidism - Continue Synthroid 50 g daily  Chronic systolic congestive heart failure (HCC) / Automatic implantable cardioverter-defibrillator in situ - Last 2-D echo in 2011 showed ejection fraction of 25-30% with inferoposterior and apical akinesis - Respiratory status stable - Continue daily aspirin  Chronic kidney disease stage IV GFR 17 ml/min - Baseline creatinine in December 2015 was 3.48 and on this admission 3.27 so around the baseline values - Lasix was held at the time of the admission - Cr better, 3.01 - Will check BMP in am  Thrombocytopenia - Platelet count 120, 100 - Likely due to sepsis - No evidence of bleeding - Monitor daily CBC    DVT Prophylaxis  - Stopped Lovenox 4/12 and using SCD's bilaterally    Code Status: DNR/DNI  Family Communication:  plan of care discussed with the patient; his daughter is POA and updated her as well  Disposition Plan: home likely 4/14  IV access:  Peripheral IV  Procedures and diagnostic studies:    Dg Chest Portable 1 View 12/10/2015 Enlargement of cardiac silhouette post median sternotomy and AICD placement. No acute abnormalities. Electronically Signed   By: Lavonia Dana M.D.   On: 12/10/2015 19:41   Medical Consultants:  None  Other Consultants:  None  IAnti-Infectives:   Rocephin 12/10/2015 --> Vanco 4/12 --> 4/13   Patrick [name], MD  [facility] Pager  [phone]  Time spent in minutes: 25 minutes  If 7PM-7AM, please contact night-coverage [email or web address] Password [email or web address] 12/12/2015, 2:23 PM   LOS: 2 days    HPI/Subjective: No acute overnight events. Patient reports no nausea or  vomiting.  Objective: Filed Vitals:   12/11/15 0520 12/11/15 1359 12/11/15 2223 12/12/15 0616  BP: 124/49 126/42 120/41 136/51  Pulse: 68 68 82 64  Temp: 97.7 F (36.5 C) 100.6 F (38.1 C) 98.2 F (36.8 C) 98.2 F (36.8 C)  TempSrc: Oral Oral Oral Oral  Resp: '16 20 18 18  ' Height:      Weight:      SpO2: 97% 95% 97% 98%   No intake or output data in the 24 hours ending 12/12/15 1423  Exam:   General:  Pt is alert, not in acute distress  Cardiovascular: RRR, S1/S2 (+)  Respiratory:No wheezing, no crackles, no rhonchi  Abdomen: (+)BS, non tender   Extremities: No swelling, palpable pulses   Neuro: Nonfocal  Data Reviewed: Basic Metabolic Panel:  Recent Labs Lab 12/10/15 1555 12/11/15 0923  NA 141 140  K 5.1 4.7  CL 108 108  CO2 22 21*  GLUCOSE 154* 163*  BUN 58* 51*  CREATININE 3.27* 3.01*  CALCIUM 9.2 8.2*   Liver Function Tests:  Recent Labs Lab 12/10/15 1555 12/11/15 0923  AST 18 16  ALT 15* 11*  ALKPHOS 51 43  BILITOT 0.9 0.8  PROT 6.3* 5.6*  ALBUMIN 3.4* 2.8*   No results for input(s): LIPASE, AMYLASE in the last 168 hours. No results for input(s): AMMONIA in the last 168 hours. CBC:  Recent Labs Lab 12/10/15 1555 12/11/15 1257  WBC 9.6 9.8  NEUTROABS 8.7*  --   HGB 11.9* 10.6*  HCT 35.1* 33.0*  MCV 93.6 96.8  PLT 120* 100*   Cardiac Enzymes: No results for input(s): CKTOTAL, CKMB, CKMBINDEX, TROPONINI in the last 168 hours. BNP: Invalid input(s): POCBNP CBG:  Recent Labs Lab 12/11/15 1147 12/11/15 1624 12/11/15 2311 12/12/15 0822 12/12/15 1221  GLUCAP 169* 103* 124* 86 115*    Recent Results (from the past 240 hour(s))  Culture, blood (Routine x 2)     Status: Abnormal (Preliminary result)   Collection Time: 12/10/15  3:55 PM  Result Value Ref Range Status   Specimen Description BLOOD LEFT WRIST  Final   Special Requests BOTTLES DRAWN AEROBIC AND ANAEROBIC 5CC  Final   Culture  Setup Time   Final    GRAM POSITIVE  COCCI IN CLUSTERS ANAEROBIC BOTTLE ONLY CRITICAL RESULT CALLED TO, READ BACK BY AND VERIFIED WITH: T DUONG,RN AT 1128 12/11/15 BY L BENFIELD    Culture STAPHYLOCOCCUS SPECIES (COAGULASE NEGATIVE) (A)  Final   Report Status PENDING  Incomplete  Culture, blood (Routine x 2)     Status: None (Preliminary result)   Collection Time: 12/10/15  3:57 PM  Result Value Ref Range Status   Specimen Description BLOOD LEFT FOREARM  Final   Special Requests BOTTLES DRAWN AEROBIC AND ANAEROBIC 5CC  Final   Culture  Setup Time   Final    GRAM NEGATIVE RODS IN BOTH AEROBIC AND ANAEROBIC BOTTLES CRITICAL RESULT CALLED TO, READ BACK BY AND VERIFIED WITH: M RUDISILL,RN '@0540'  12/11/15 MKELLY    Culture GRAM NEGATIVE RODS  Final   Report Status PENDING  Incomplete  Urine culture     Status: Abnormal (Preliminary result)   Collection Time: 12/10/15  4:15 PM  Result Value Ref Range Status   Specimen Description URINE,  CATHETERIZED  Final   Special Requests NONE  Final   Culture >=100,000 COLONIES/mL GRAM NEGATIVE RODS (A)  Final   Report Status PENDING  Incomplete     Scheduled Meds: . aspirin EC  81 mg Oral Daily  . calcitRIOL  0.25 mcg Oral Daily  . cefTRIAXone (ROCEPHIN)  IV  2 g Intravenous Q24H  . donepezil  10 mg Oral QHS  . gabapentin  100 mg Oral BID  . insulin aspart  0-9 Units Subcutaneous TID WC  . insulin glargine  15 Units Subcutaneous Daily  . levothyroxine  50 mcg Oral QAC breakfast  . pantoprazole  40 mg Oral Daily  . simvastatin  40 mg Oral QHS  . tamsulosin  0.4 mg Oral QPC supper   Continuous Infusions: . sodium chloride 40 mL/hr at 12/12/15 0539

## 2015-12-13 DIAGNOSIS — A414 Sepsis due to anaerobes: Secondary | ICD-10-CM

## 2015-12-13 LAB — CULTURE, BLOOD (ROUTINE X 2)

## 2015-12-13 LAB — GLUCOSE, CAPILLARY
GLUCOSE-CAPILLARY: 110 mg/dL — AB (ref 65–99)
GLUCOSE-CAPILLARY: 129 mg/dL — AB (ref 65–99)

## 2015-12-13 MED ORDER — CIPROFLOXACIN HCL 500 MG PO TABS: 500.0000 mg | ORAL_TABLET | Freq: Two times a day (BID) | ORAL | Status: DC

## 2015-12-13 MED ORDER — INSULIN GLARGINE 300 UNIT/ML ~~LOC~~ SOPN: 15.0000 [IU] | PEN_INJECTOR | Freq: Every day | SUBCUTANEOUS | Status: DC

## 2015-12-13 NOTE — Care Management Important Message (Signed)
Important Message  Patient Details  Name: KAYO VIDALS MRN: [id number] Date of Birth: August 20, 1934   Medicare Important Message Given:  Yes    Philippe Gang Abena 12/13/2015, 11:30 AM

## 2015-12-13 NOTE — Discharge Instructions (Addendum)
Bacteremia °Bacteremia is the presence of bacteria in the blood. A small amount of bacteria may not cause any symptoms. °Sometimes, the bacteria spread and cause infection in other parts of the body, such as the heart, joints, bones, or brain. Having a great amount of bacteria can cause a serious, sometimes life-threatening infection called sepsis. °CAUSES °This condition is caused by bacteria that get into the blood. Bacteria can enter the blood: °· During a dental or medical procedure. °· After you brush your teeth so hard that the gums bleed. °· Through a scrape or cut on your skin. °More severe types of bacteremia can be caused by: °· A bacterial infection, such as pneumonia, that spreads to the blood. °· Using a dirty needle. °RISK FACTORS °This condition is more likely to develop in: °· Children and elderly adults. °· People who have a long-lasting (chronic) disease or medical condition. °· People who have an artificial joint or heart valve. °· People who have heart valve disease. °· People who have a tube, such as a catheter or IV tube, that has been inserted for a medical treatment. °· People who have a weak body defense system (immune system). °· People who use IV drugs. °SYMPTOMS °Usually, this condition does not cause symptoms when it is mild. When it is more serious, it may cause: °· Fever. °· Chills. °· Racing heart. °· Shortness of breath. °· Dizziness. °· Weakness. °· Confusion. °· Nausea or vomiting. °· Diarrhea. °Bacteremia that has spread to other parts of the body may cause symptoms in those areas. °DIAGNOSIS °This condition may be diagnosed with a physical exam and tests, such as: °· A complete blood count (CBC). This test looks for signs of infection. °· Blood cultures. These look for bacteria in your blood. °· Tests of any IV tubes. These look for a source of infection. °· Urine tests. °· Imaging tests, such as an X-ray, CT scan, MRI, or heart ultrasound. °TREATMENT °If the condition is mild,  treatment is usually not needed. Usually, the body's immune system will remove the bacteria. If the condition is more serious, it may be treated with: °· Antibiotic medicines through an IV tube. These may be given for about 2 weeks. At first, the antibiotic that is given may kill most types of blood bacteria. If your test results show that a certain kind of bacteria is causing problems, the antibiotic may be changed to kill only the bacteria that are causing problems. °· Antibiotics taken by mouth. °· Removing any catheter or IV tube that is a source of infection. °· Blood pressure and breathing support, if needed. °· Surgery to control the source or spread of infection, if needed. °HOME CARE INSTRUCTIONS °· Take over-the-counter and prescription medicines only as told by your health care provider. °· If you were prescribed an antibiotic, take it as told by your health care provider. Do not stop taking the antibiotic even if you start to feel better. °· Rest at home until your condition is under control. °· Drink enough fluid to keep your urine clear or pale yellow. °· Keep all follow-up visits as told by your health care provider. This is important. °PREVENTION °Take these actions to help prevent future episodes of bacteremia: °· Get all vaccinations as recommended by your health care provider. °· Clean and cover scrapes or cuts. °· Bathe regularly. °· Wash your hands often. °· Before any dental or surgical procedure, ask your health care provider if you should take an antibiotic. °SEEK MEDICAL   CARE IF:  Your symptoms get worse.  You continue to have symptoms after treatment.  You develop new symptoms after treatment. SEEK IMMEDIATE MEDICAL CARE IF:  You have chest pain or trouble breathing.  You develop confusion, dizziness, or weakness.  You develop pale skin.   This information is not intended to replace advice given to you by your health care provider. Make sure you discuss any questions you have  with your health care provider.   Document Released: 05/31/2006 Document Revised: 05/08/2015 Document Reviewed: 10/20/2014 Elsevier Interactive Patient Education 2016 [employer] Ciprofloxacin extended-release tablets What is this medicine? CIPROFLOXACIN (sip roe FLOX a sin) is a quinolone antibiotic. It is used to treat certain kinds of bacterial infections. It will not work for colds, flu, or other viral infections. This medicine may be used for other purposes; ask your health care provider or pharmacist if you have questions. What should I tell my health care provider before I take this medicine? They need to know if you have any of these conditions: -bone problems -cerebral disease -history of low potassium levels in the blood -irregular heartbeat -joint problems -kidney disease -myasthenia gravis -seizures -tendon problems -tingling of the fingers or toes, or other nerve disorder -an unusual or allergic reaction to ciprofloxacin, other antibiotics or medicines, foods, dyes, or preservatives -pregnant or trying to get pregnant -breast-feeding How should I use this medicine? Take this medicine by mouth with a full glass of water. Follow the directions on the prescription label. Do not split, crush, or chew the tablet. Take your medicine at regular intervals. Do not take your medicine more often than directed. Take all of your medicine as directed even if you think your are better. Do not skip doses or stop your medicine early. You can take this medicine with food or on an empty stomach. It can be taken with a meal that contains dairy or calcium, but do not take it alone with a dairy product, like milk or yogurt, or calcium-fortified juice. A special MedGuide will be given to you by the pharmacist with each prescription and refill. Be sure to read this information carefully each time. Talk to your pediatrician regarding the use of this medicine in children. Special care may be  needed. Overdosage: If you think you have taken too much of this medicine contact a poison control center or emergency room at once. NOTE: This medicine is only for you. Do not share this medicine with others. What if I miss a dose? If you miss a dose, take it as soon as you can. If it is almost time for your next dose, take only that dose. Do not take double or extra doses. Do not take more than one dose in a day. What may interact with this medicine? Do not take this medicine with any of the following medications: -cisapride -droperidol -terfenadine -tizanidine This medicine may also interact with the following medications: -antacids -birth control pills -caffeine -cyclosporin -didanosine (ddI) buffered tablets or powder -medicines for diabetes -medicines for inflammation like ibuprofen, naproxen -methotrexate -multivitamins -omeprazole -phenytoin -probenecid -sucralfate -theophylline -warfarin This list may not describe all possible interactions. Give your health care provider a list of all the medicines, herbs, non-prescription drugs, or dietary supplements you use. Also tell them if you smoke, drink alcohol, or use illegal drugs. Some items may interact with your medicine. What should I watch for while using this medicine? Tell your doctor or health care professional if your symptoms do not improve. Do not  treat diarrhea with over the counter products. Contact your doctor if you have diarrhea that lasts more than 2 days or if it is severe and watery. You may get drowsy or dizzy. Do not drive, use machinery, or do anything that needs mental alertness until you know how this medicine affects you. Do not stand or sit up quickly, especially if you are an older patient. This reduces the risk of dizzy or fainting spells. This medicine can make you more sensitive to the sun. Keep out of the sun. If you cannot avoid being in the sun, wear protective clothing and use sunscreen. Do not use  sun lamps or tanning beds/booths. Avoid antacids, aluminum, calcium, iron, magnesium, and zinc products for 6 hours before and 2 hours after taking a dose of this medicine. What side effects may I notice from receiving this medicine? Side effects that you should report to your doctor or health care professional as soon as possible: -allergic reactions like skin rash or hives, swelling of the face, lips, or tongue -anxious -confusion -depressed mood -diarrhea -fast, irregular heartbeat -hallucination, loss of contact with reality -joint, muscle, or tendon pain or swelling -pain, tingling, numbness in the hands or feet -suicidal thoughts or other mood changes -sunburn -unusually weak or tired Side effects that usually do not require medical attention (report to your doctor or health care professional if they continue or are bothersome): -dry mouth -headache -nausea -trouble sleeping This list may not describe all possible side effects. Call your doctor for medical advice about side effects. You may report side effects to FDA at 1-800-FDA-1088. Where should I keep my medicine? Keep out of the reach of children. Store at room temperature between 15 to 30 degrees C (59 to 86 degrees F). Keep container tightly closed. Throw away any unused medicine after the expiration date. NOTE: This sheet is a summary. It may not cover all possible information. If you have questions about this medicine, talk to your doctor, pharmacist, or health care provider.    2016, Elsevier/Gold Standard. (2015-03-28 12:49:29)

## 2015-12-13 NOTE — Discharge Summary (Signed)
Physician Discharge Summary  Patrick Boyle LFY:[id number] DOB: Apr 04, 1934 DOA: 12/10/2015  PCP: Orpah Melter, MD  Admit date: 12/10/2015 Discharge date: 12/13/2015  Recommendations for Outpatient Follow-up:  Take ciprofloxacin for 10 days on discharge for Klebsiella bacteremia Please note we reduced insulin dose to 15 units daily  Stop lasix for next 1 week, check with PCP in 1 week, recheck kidney function and kidney function stable then safe to resume lasix per home dose  Discharge Diagnoses:  Principal Problem:   Sepsis secondary to UTI [facility]) Active Problems:   Chronic systolic congestive heart failure (HCC)   Automatic implantable cardioverter-defibrillator in situ   CKD (chronic kidney disease) stage 4, GFR 15-29 ml/min (HCC)   Uncontrolled diabetes mellitus with diabetic nephropathy, with long-term current use of insulin (HCC)   Hypothyroidism   Thrombocytopenia (HCC)   Diabetes mellitus ([place])   Essential hypertension   Coronary atherosclerosis   UTI (urinary tract infection)   UTI (urinary tract infection) due to urinary indwelling catheter ([place])    Discharge Condition: stable   Diet recommendation: as tolerated   History of present illness:  82 y.o. male with past medical history of CAD/CABG, ischemic cardiomyopathy s/p AICD, diabetes on insulin, CKD stage 4 (GFR 15 in 07/2014), BPH requiring intermittent self-catheterization who presented to [facility] with weakness, fevers and chills for past 24 hours prior to this admission in addition to intermittent confusion reported by EMS.  On admission, patient was hemodynamically stable. Tmax was 100.2 F, creatinine was 3.27 (baseline in 07/2014 was 3.48). Urinalysis demonstrated large leukocytes and many bacteria. Lactic acid was within normal limits. He was started on Rocephin while awaiting urine and blood culture results. He was also on vanco 4/12 and 4/13 until blood culture result available (initially GPC in  clusters but then staph coag neg species likely a contaminant).  Hospital Course:  Assessment/Plan:    Principal problem: Sepsis secondary to Klebsiella Pneumoniae urinary tract infection without hematuria in patient with intermittent self-catheterization - Sepsis criteria were met on the admission with fever, tachypnea, source of infection UTI. - Procalcitonin was elevated at 4.63, lactic acid was WNL - Urinalysis on the admission showed large leukocytes, many bacteria and too numerous to count white blood cells  - Urine culture growing klebsiella - He is on rocephin now but will switch to cipro on discharge for 10 day  Active Problems: Klebsiella pneumoniae bacteremia - Blood cultures on the admission grew klebsiella in 1 set only, another set was contaminant - No need to repeat blood cultures at this point - We started vanco 4/12 but stopped 4/13 since caog neg species likely a contaminant rather than true infection - He is on rocephin now but will switch to cipro for 10 days on discharge per sens report   Uncontrolled type 2 diabetes mellitus with diabetic nephropathy with long-term insulin use - A1c in December 2015 was 7.6 - Continue Lantus 15 units daily instead of 30 Units since CBG's 171, 110 ,129 on this regimen  - Continue januvia per home dose   Diabetic neuropathy - Continue gabapentin  Dyslipidemia associated with type 2 diabetes mellitus - Continue simvastatin 40 mg at bedtime  Hypothyroidism - Continue Synthroid 50 g daily  Chronic systolic congestive heart failure (HCC) / Automatic implantable cardioverter-defibrillator in situ - Last 2-D echo in 2011 showed ejection fraction of 25-30% with inferoposterior and apical akinesis - Respiratory state stable - Continue daily aspirin  Chronic kidney disease stage IV GFR 17 ml/min -  Baseline creatinine in December 2015 was 3.48 and on this admission 3.27 so around the baseline values - Lasix was held at the  time of the admission - Cr better, 3.01 - Will check BMP in am  Thrombocytopenia - Platelet count 120, 100 - Likely due to sepsis - No evidence of bleeding   DVT Prophylaxis  - Stopped Lovenox 4/12 and used SCD's bilaterally    Code Status: DNR/DNI  Family Communication: plan of care discussed with the patient; his daughter is POA and updated her as well    IV access:  Peripheral IV  Procedures and diagnostic studies:   Dg Chest Portable 1 View 12/10/2015 Enlargement of cardiac silhouette post median sternotomy and AICD placement. No acute abnormalities. Electronically Signed By: Lavonia Dana M.D. On: 12/10/2015 19:41   Medical Consultants:  None  Other Consultants:  None  IAnti-Infectives:   Rocephin 12/10/2015 --> 12/13/2015  Vanco 4/12 --> 12/12/2015     Signed:  Leisa [name], MD  [facility] 12/13/2015, 1:04 PM  Pager #: [phone]  Time spent in minutes: more than 30 minutes   Discharge Exam: Filed Vitals:   12/12/15 0616 12/12/15 2121  BP: 136/51 130/47  Pulse: 64 60  Temp: 98.2 F (36.8 C) 98.5 F (36.9 C)  Resp: 18 22   Filed Vitals:   12/11/15 1359 12/11/15 2223 12/12/15 0616 12/12/15 2121  BP: 126/42 120/41 136/51 130/47  Pulse: 68 82 64 60  Temp: 100.6 F (38.1 C) 98.2 F (36.8 C) 98.2 F (36.8 C) 98.5 F (36.9 C)  TempSrc: Oral Oral Oral Oral  Resp: '20 18 18 22  ' Height:      Weight:      SpO2: 95% 97% 98% 97%    General: Pt is alert, follows commands appropriately, not in acute distress Cardiovascular: Regular rate and rhythm, S1/S2 + Respiratory: Clear to auscultation bilaterally, no wheezing, no crackles, no rhonchi Abdominal: Soft, non tender, non distended, bowel sounds +, no guarding Extremities: no edema, no cyanosis, pulses palpable bilaterally DP and PT Neuro: Grossly nonfocal  Discharge Instructions  Discharge Instructions    Call MD for:  difficulty breathing, headache or visual  disturbances    Complete by:  As directed      Call MD for:  difficulty breathing, headache or visual disturbances    Complete by:  As directed      Call MD for:  persistant dizziness or light-headedness    Complete by:  As directed      Call MD for:  persistant dizziness or light-headedness    Complete by:  As directed      Call MD for:  persistant nausea and vomiting    Complete by:  As directed      Call MD for:  persistant nausea and vomiting    Complete by:  As directed      Call MD for:  severe uncontrolled pain    Complete by:  As directed      Call MD for:  severe uncontrolled pain    Complete by:  As directed      Diet - low sodium heart healthy    Complete by:  As directed      Diet - low sodium heart healthy    Complete by:  As directed      Discharge instructions    Complete by:  As directed   Take ciprofloxacin for 10 days on discharge for Klebsiella bacteremia Please note we reduced insulin dose to  15 units daily  Stop lasix for next 1 week, check with PCP in 1 week, recheck kidney function and kidney function stable then safe to resume lasix per home dose     Increase activity slowly    Complete by:  As directed      Increase activity slowly    Complete by:  As directed             Medication List    STOP taking these medications        furosemide 20 MG tablet  Commonly known as:  LASIX      TAKE these medications        ARTHRITIS PAIN RELIEF PO  Take 650 mg by mouth 2 (two) times daily.     aspirin EC 81 MG tablet  Take 81 mg by mouth daily.     bimatoprost 0.03 % ophthalmic solution  Commonly known as:  LUMIGAN  Place 1 drop into both eyes at bedtime.     calcitRIOL 0.25 MCG capsule  Commonly known as:  ROCALTROL  Take 0.25 mcg by mouth daily.     cetirizine 10 MG tablet  Commonly known as:  ZYRTEC  Take 5 mg by mouth daily.     cholecalciferol 1000 units tablet  Commonly known as:  VITAMIN D  Take 1,000 Units by mouth daily.      ciprofloxacin 500 MG tablet  Commonly known as:  CIPRO  Take 1 tablet (500 mg total) by mouth 2 (two) times daily.     cyanocobalamin 500 MCG tablet  Take 500 mcg by mouth daily.     donepezil 10 MG tablet  Commonly known as:  ARICEPT  Take 10 mg by mouth at bedtime.     fish oil-omega-3 fatty acids 1000 MG capsule  Take 2 g by mouth 2 (two) times daily.     gabapentin 300 MG capsule  Commonly known as:  NEURONTIN  Take 300 mg by mouth 2 (two) times daily.     Insulin Glargine 300 UNIT/ML Sopn  Commonly known as:  TOUJEO SOLOSTAR  Inject 15 Units into the skin daily.     lansoprazole 30 MG capsule  Commonly known as:  PREVACID  Take 30 mg by mouth daily as needed (for acid reflex).     levothyroxine 50 MCG tablet  Commonly known as:  SYNTHROID, LEVOTHROID  Take 50 mcg by mouth daily before breakfast.     MECLIZINE HCL PO  Take 5 mg by mouth daily as needed (vertigo). Reported on 12/10/2015     ONE TOUCH ULTRA TEST test strip  Generic drug:  glucose blood  TEST 1 TIME DAILY.     simvastatin 40 MG tablet  Commonly known as:  ZOCOR  Take 40 mg by mouth at bedtime.     sitaGLIPtin 50 MG tablet  Commonly known as:  JANUVIA  Take 25 mg by mouth daily with breakfast.     sodium bicarbonate 650 MG tablet  Take 1 tablet (650 mg total) by mouth 2 (two) times daily.     tamsulosin 0.4 MG Caps capsule  Commonly known as:  FLOMAX  Take 1 capsule (0.4 mg total) by mouth daily after supper.     traMADol 50 MG tablet  Commonly known as:  ULTRAM  Take 1 tablet by mouth every 6 (six) hours as needed (pain). For 10 days           Follow-up Information    Follow up with  Orpah Melter, MD. Schedule an appointment as soon as possible for a visit in 1 week.   Specialty:  Family Medicine   Why:  Follow up appt after recent hospitalization   Contact information:   [address] [place] [id number] [phone]        The results of significant diagnostics  from this hospitalization (including imaging, microbiology, ancillary and laboratory) are listed below for reference.    Significant Diagnostic Studies: Dg Chest Portable 1 View  12/10/2015  CLINICAL DATA:  Altered mental status beginning at 1000 hours, fever EXAM: PORTABLE CHEST 1 VIEW COMPARISON:  Portable exam 1904 hours without priors for comparison FINDINGS: LEFT subclavian transvenous AICD lead tip projects over RIGHT ventricle. Enlargement of cardiac silhouette post median sternotomy. Calcified tortuous thoracic aorta. Lungs grossly clear. No definite pleural effusion or pneumothorax. IMPRESSION: Enlargement of cardiac silhouette post median sternotomy and AICD placement. No acute abnormalities. Electronically Signed   By: Lavonia Dana M.D.   On: 12/10/2015 19:41    Microbiology: Recent Results (from the past 240 hour(s))  Culture, blood (Routine x 2)     Status: Abnormal   Collection Time: 12/10/15  3:55 PM  Result Value Ref Range Status   Specimen Description BLOOD LEFT WRIST  Final   Special Requests BOTTLES DRAWN AEROBIC AND ANAEROBIC 5CC  Final   Culture  Setup Time   Final    GRAM POSITIVE COCCI IN CLUSTERS ANAEROBIC BOTTLE ONLY CRITICAL RESULT CALLED TO, READ BACK BY AND VERIFIED WITH: T DUONG,RN AT 1128 12/11/15 BY L BENFIELD    Culture (A)  Final    STAPHYLOCOCCUS SPECIES (COAGULASE NEGATIVE) THE SIGNIFICANCE OF ISOLATING THIS ORGANISM FROM A SINGLE SET OF BLOOD CULTURES WHEN MULTIPLE SETS ARE DRAWN IS UNCERTAIN. PLEASE NOTIFY THE MICROBIOLOGY DEPARTMENT WITHIN ONE WEEK IF SPECIATION AND SENSITIVITIES ARE REQUIRED.    Report Status 12/13/2015 FINAL  Final  Culture, blood (Routine x 2)     Status: Abnormal   Collection Time: 12/10/15  3:57 PM  Result Value Ref Range Status   Specimen Description BLOOD LEFT FOREARM  Final   Special Requests BOTTLES DRAWN AEROBIC AND ANAEROBIC 5CC  Final   Culture  Setup Time   Final    GRAM NEGATIVE RODS IN BOTH AEROBIC AND ANAEROBIC  BOTTLES CRITICAL RESULT CALLED TO, READ BACK BY AND VERIFIED WITH: M RUDISILL,RN '@0540'  12/11/15 MKELLY    Culture KLEBSIELLA PNEUMONIAE (A)  Final   Report Status 12/13/2015 FINAL  Final   Organism ID, Bacteria KLEBSIELLA PNEUMONIAE  Final      Susceptibility   Klebsiella pneumoniae - MIC*    AMPICILLIN 16 RESISTANT Resistant     CEFAZOLIN <=4 SENSITIVE Sensitive     CEFEPIME <=1 SENSITIVE Sensitive     CEFTAZIDIME <=1 SENSITIVE Sensitive     CEFTRIAXONE <=1 SENSITIVE Sensitive     CIPROFLOXACIN <=0.25 SENSITIVE Sensitive     GENTAMICIN <=1 SENSITIVE Sensitive     IMIPENEM <=0.25 SENSITIVE Sensitive     TRIMETH/SULFA <=20 SENSITIVE Sensitive     AMPICILLIN/SULBACTAM <=2 SENSITIVE Sensitive     PIP/TAZO <=4 SENSITIVE Sensitive     * KLEBSIELLA PNEUMONIAE  Urine culture     Status: Abnormal (Preliminary result)   Collection Time: 12/10/15  4:15 PM  Result Value Ref Range Status   Specimen Description URINE, CATHETERIZED  Final   Special Requests NONE  Final   Culture >=100,000 COLONIES/mL KLEBSIELLA PNEUMONIAE (A)  Final   Report Status PENDING  Incomplete  Organism ID, Bacteria KLEBSIELLA PNEUMONIAE (A)  Final      Susceptibility   Klebsiella pneumoniae - MIC*    AMPICILLIN >=32 RESISTANT Resistant     CEFAZOLIN <=4 SENSITIVE Sensitive     CEFTRIAXONE <=1 SENSITIVE Sensitive     CIPROFLOXACIN <=0.25 SENSITIVE Sensitive     GENTAMICIN <=1 SENSITIVE Sensitive     IMIPENEM >=16 RESISTANT Resistant     NITROFURANTOIN 64 INTERMEDIATE Intermediate     TRIMETH/SULFA <=20 SENSITIVE Sensitive     AMPICILLIN/SULBACTAM 16 INTERMEDIATE Intermediate     PIP/TAZO <=4 SENSITIVE Sensitive     * >=100,000 COLONIES/mL KLEBSIELLA PNEUMONIAE     Labs: Basic Metabolic Panel:  Recent Labs Lab 12/10/15 1555 12/11/15 0923  NA 141 140  K 5.1 4.7  CL 108 108  CO2 22 21*  GLUCOSE 154* 163*  BUN 58* 51*  CREATININE 3.27* 3.01*  CALCIUM 9.2 8.2*   Liver Function Tests:  Recent  Labs Lab 12/10/15 1555 12/11/15 0923  AST 18 16  ALT 15* 11*  ALKPHOS 51 43  BILITOT 0.9 0.8  PROT 6.3* 5.6*  ALBUMIN 3.4* 2.8*   No results for input(s): LIPASE, AMYLASE in the last 168 hours. No results for input(s): AMMONIA in the last 168 hours. CBC:  Recent Labs Lab 12/10/15 1555 12/11/15 1257  WBC 9.6 9.8  NEUTROABS 8.7*  --   HGB 11.9* 10.6*  HCT 35.1* 33.0*  MCV 93.6 96.8  PLT 120* 100*   Cardiac Enzymes: No results for input(s): CKTOTAL, CKMB, CKMBINDEX, TROPONINI in the last 168 hours. BNP: BNP (last 3 results) No results for input(s): BNP in the last 8760 hours.  ProBNP (last 3 results) No results for input(s): PROBNP in the last 8760 hours.  CBG:  Recent Labs Lab 12/12/15 1221 12/12/15 1711 12/12/15 2118 12/13/15 0759 12/13/15 1158  GLUCAP 115* 124* 171* 110* 129*

## 2015-12-13 NOTE — Progress Notes (Signed)
Pt given discharge instructions, prescriptions, and care notes. Pt verbalized understanding AEB no further questions or concerns at this time. IV was discontinued, no redness, pain, or swelling noted at this time. Pt left the floor via wheelchair with staff in stable condition. 

## 2015-12-14 LAB — URINE CULTURE

## 2015-12-14 IMAGING — US US RENAL
1 series · 14 of 25 positions shown · non-contrast
Comparison: None.

CLINICAL DATA: Chronic kidney disease.  Obstruction.

EXAM:
RENAL/URINARY TRACT ULTRASOUND COMPLETE

[Series 1: us renal · 0.24mm/px · 14 of 31 slices shown]
[im 1/31]
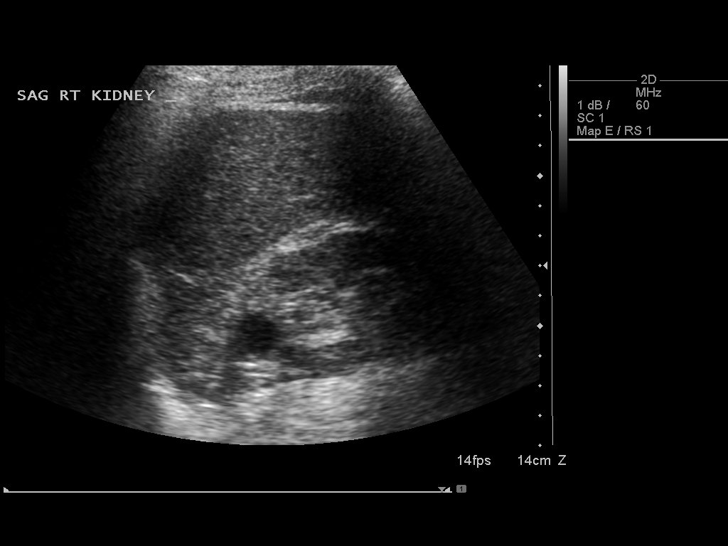
[im 3/31]
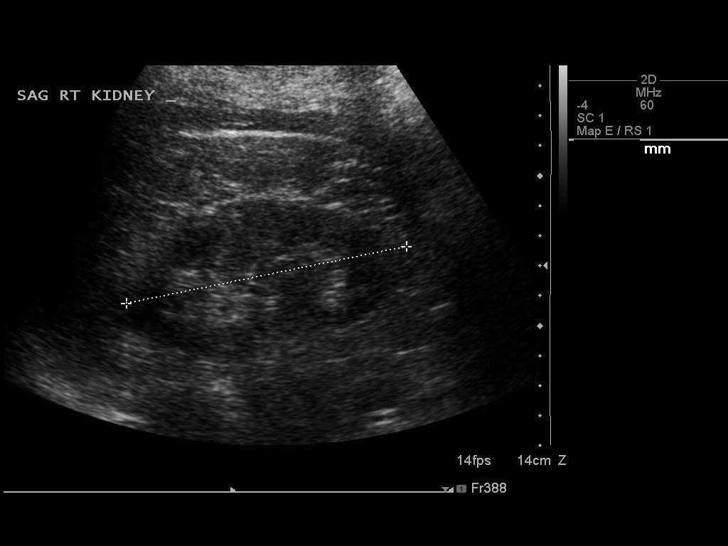
[im 6/31]
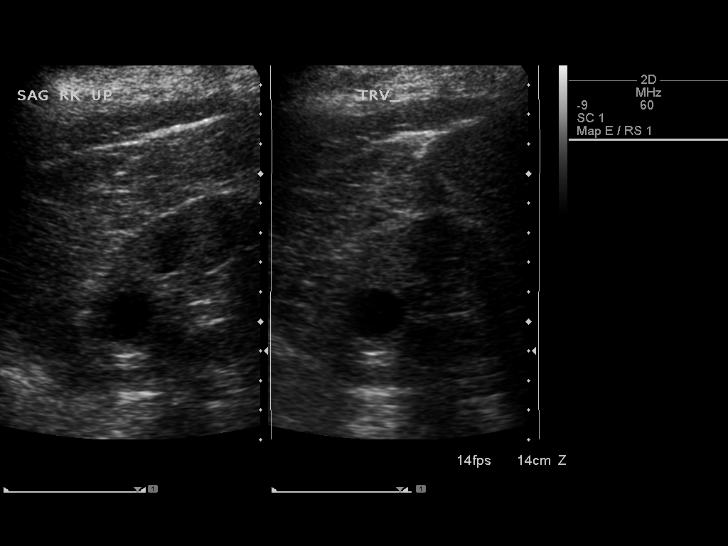
[im 8/31]
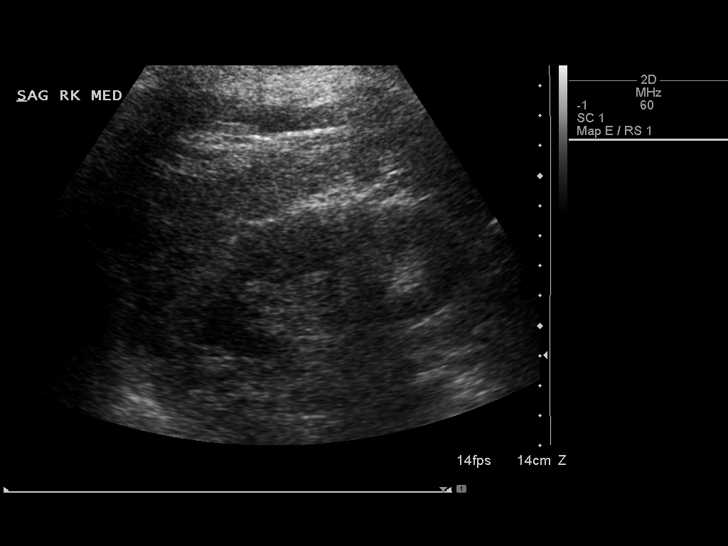
[im 11/31]
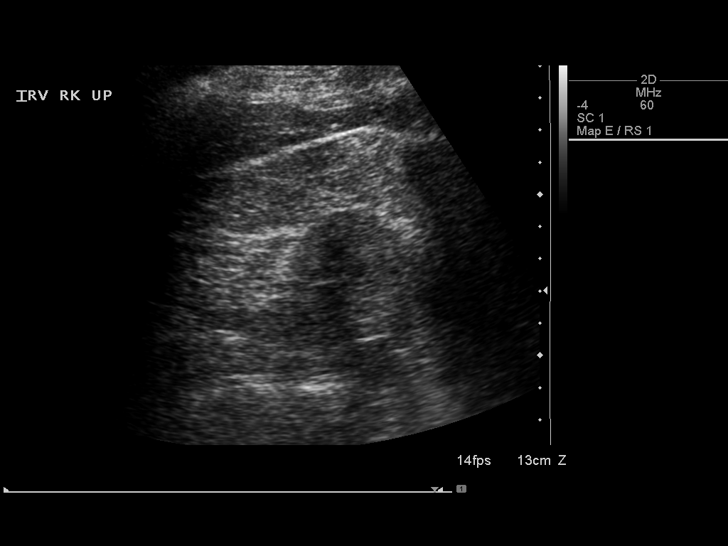
[im 12/31]
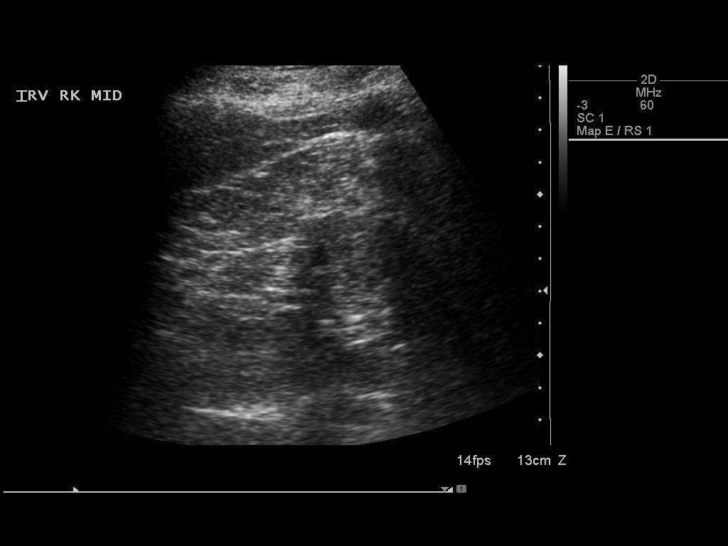
[im 14/31]
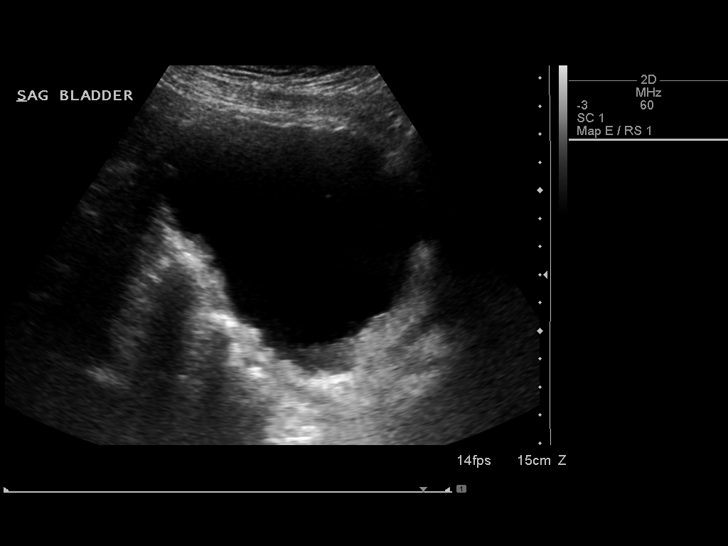
[im 17/31]
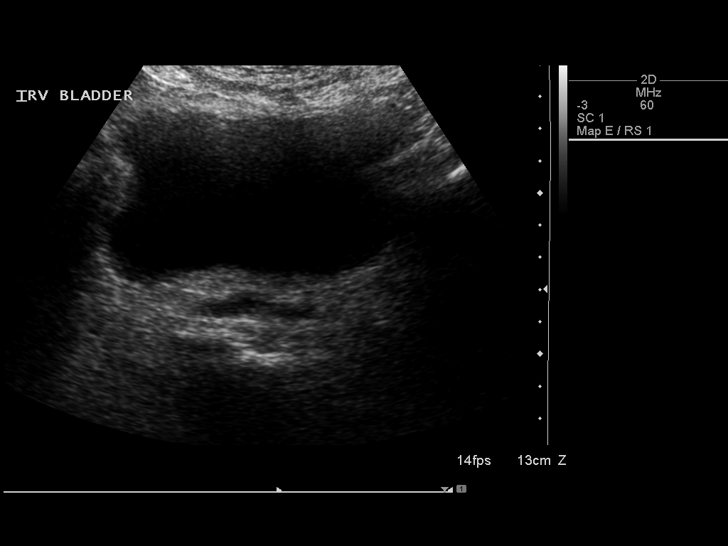
[im 19/31]
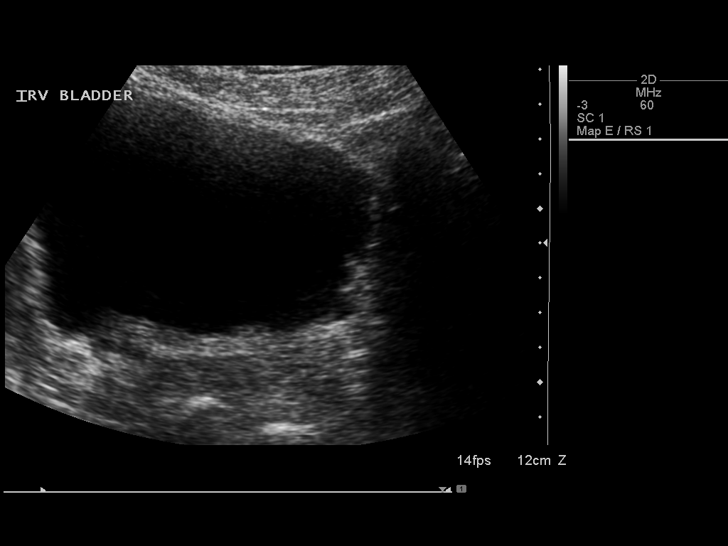
[im 21/31]
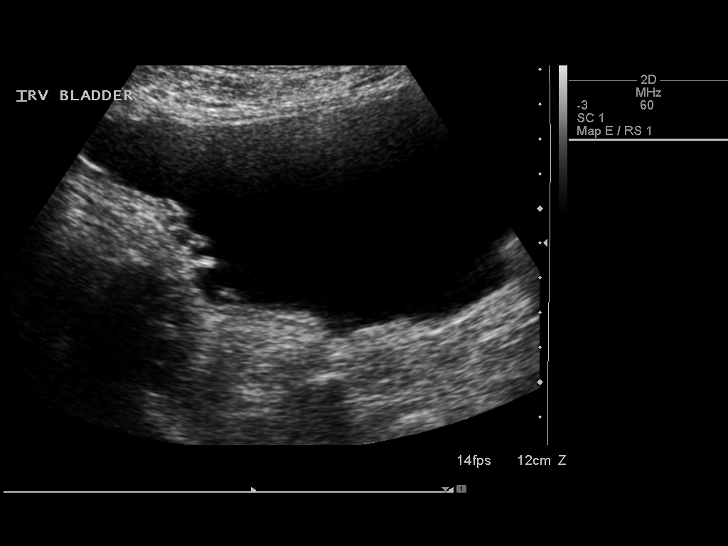
[im 23/31]
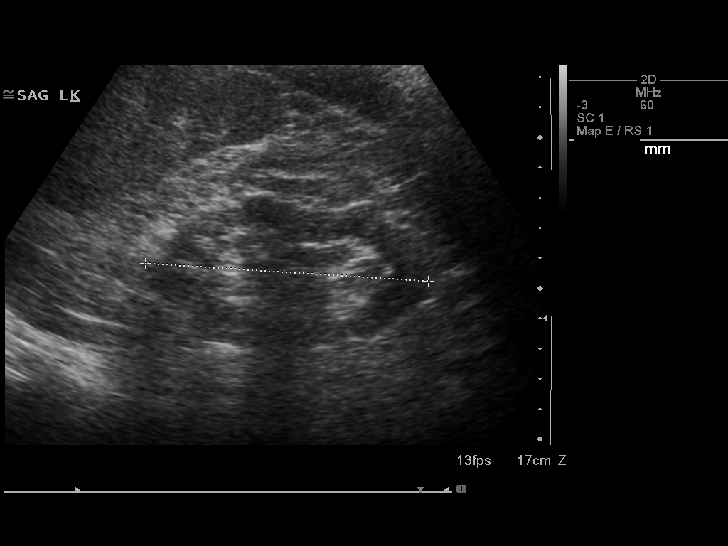
[im 26/31]
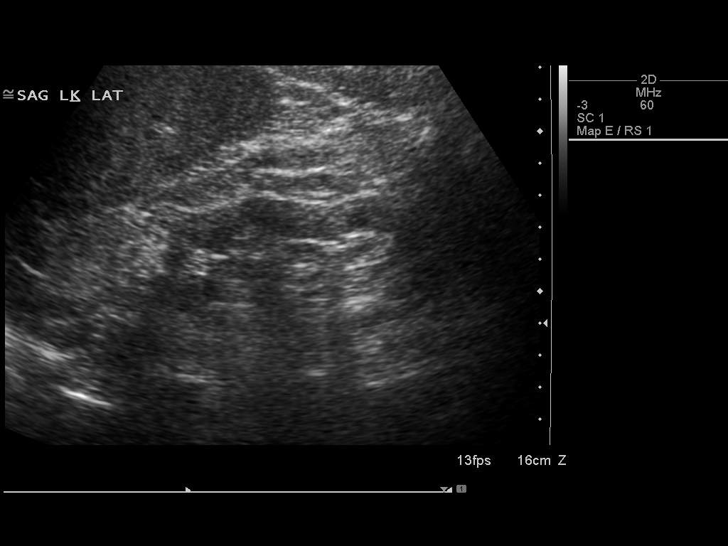
[im 28/31]
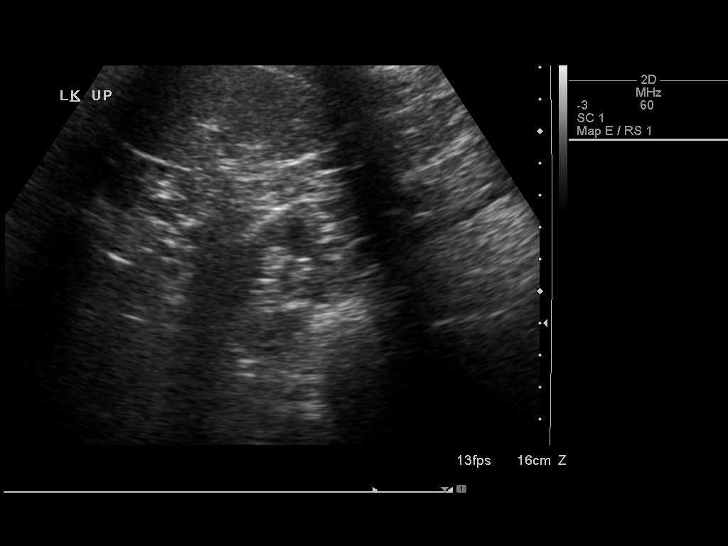
[im 31/31]
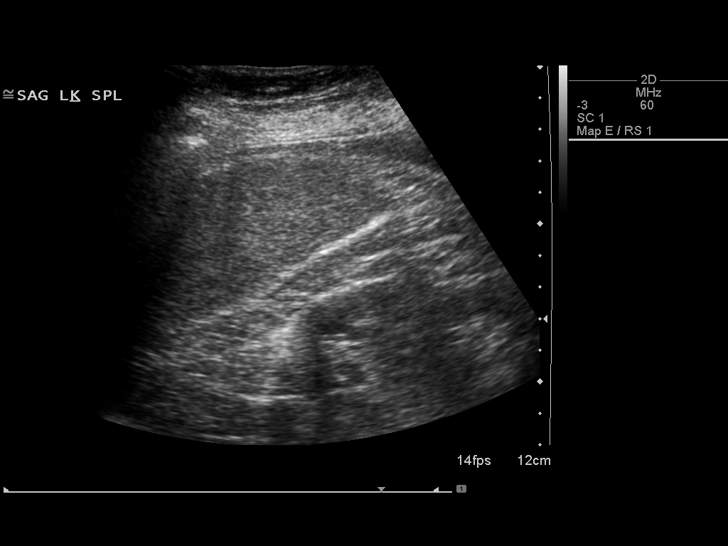

[14 of 25 positions shown; findings below may reference images not displayed]

FINDINGS: Right Kidney:

Length: 9.5 cm. Normal central sinus echo complex. Simple cyst in
the RIGHT upper renal pole measures 17 mm x 19 mm x 18 mm.

Left Kidney:

Length: 9.4 cm. Echogenicity within normal limits. No mass or
hydronephrosis visualized.

Bladder:

Bladder cellules present with probable trabeculation, suggesting
chronic bladder outflow obstruction. No debris.
IMPRESSION: 1. Negative for obstruction.  No calculi.
2. Bladder cellules suggesting chronic bladder outflow obstruction.

## 2015-12-15 NOTE — Telephone Encounter (Signed)
No further cardiac testing needed before hip surgery.  I can plan on seeing him as scheduled if he has not had any problems.

## 2015-12-16 ENCOUNTER — Encounter: Payer: Self-pay | Admitting: Internal Medicine

## 2015-12-16 ENCOUNTER — Ambulatory Visit (INDEPENDENT_AMBULATORY_CARE_PROVIDER_SITE_OTHER): Payer: Medicare Other | Admitting: Internal Medicine

## 2015-12-16 VITALS — BP 142/62 | HR 63 | Ht 71.0 in | Wt 189.0 lb

## 2015-12-16 DIAGNOSIS — I10 Essential (primary) hypertension: Secondary | ICD-10-CM | POA: Diagnosis not present

## 2015-12-16 DIAGNOSIS — I5022 Chronic systolic (congestive) heart failure: Secondary | ICD-10-CM | POA: Diagnosis not present

## 2015-12-16 DIAGNOSIS — I6523 Occlusion and stenosis of bilateral carotid arteries: Secondary | ICD-10-CM

## 2015-12-16 DIAGNOSIS — I519 Heart disease, unspecified: Secondary | ICD-10-CM

## 2015-12-16 NOTE — Patient Instructions (Signed)
Medication Instructions:  Your physician recommends that you continue on your current medications as directed. Please refer to the Current Medication list given to you today.\  Labwork: None ordered   Testing/Procedures: None ordered   Follow-Up: Your physician wants you to follow-up in: 12 months with Dr Allred You will receive a reminder letter in the mail two months in advance. If you don't receive a letter, please call our office to schedule the follow-up appointment.   Remote monitoring is used to monitor your ICD from home. This monitoring reduces the number of office visits required to check your device to one time per year. It allows us to keep an eye on the functioning of your device to ensure it is working properly. You are scheduled for a device check from home on 03/16/16. You may send your transmission at any time that day. If you have a wireless device, the transmission will be sent automatically. After your physician reviews your transmission, you will receive a postcard with your next transmission date.    Any Other Special Instructions Will Be Listed Below (If Applicable).     If you need a refill on your cardiac medications before your next appointment, please call your pharmacy.   

## 2015-12-16 NOTE — Telephone Encounter (Addendum)
We received a form from the pt today from Dr Carlean Jews stating that the pt is scheduled to have his left hip: THA w w/o autograft/allograft on 01/21/16.  I have faxed this message to Dr Aurea Graff office through the Epic system and I will call tomorrow (office is closed at this time) to make sure they received it and if they need anything else from cardiology prior to the pts procedure.

## 2015-12-16 NOTE — Progress Notes (Signed)
Electrophysiology Office Note   Date:  12/16/2015   ID:  Patrick Boyle, Patrick Boyle Aug 29, 1934, MRN [id number]  PCP:  Orpah Melter, MD  Cardiologist:  Dr Irish Lack Primary Electrophysiologist: Thompson Grayer, MD    Chief Complaint  Patient presents with  . Follow-up     History of Present Illness: Patrick Boyle is a 82 y.o. male who presents today for electrophysiology evaluation.   He returns today for EP follow-up.  He is doing well.  He is planning to have hip surgery soon.  He is also considering moving to [place].  He also recently had Klebsiella UTI with bacteremia.  He is completing Cipro now but is without any symptoms of infection.  Today, he denies symptoms of palpitations, chest pain, shortness of breath, orthopnea, PND, lower extremity edema, claudication, dizziness, presyncope, syncope, bleeding, or neurologic sequela. The patient is tolerating medications without difficulties and is otherwise without complaint today.    Past Medical History  Diagnosis Date  . Hyperlipidemia   . Hypertension   . Coronary artery disease     s/p CABG 1997  . GERD (gastroesophageal reflux disease)   . Arthritis   . Allergic rhinitis   . Hyperplastic colon polyp   . Diverticulitis, colon   . Meniere's syndrome   . Tremor, essential     dr Jannifer Franklin- bengin   . COPD, mild ([place])     dr clance  . Ischemic cardiomyopathy     sp ICD (SJM) implanted by Dr Rayann Heman  . MI (myocardial infarction) ([place]) 1997, 2005  . AICD (automatic cardioverter/defibrillator) present   . Presence of permanent cardiac pacemaker   . CHF (congestive heart failure) ([place])   . Diabetes mellitus     dr Olen Pel- Type II  . Hypothyroidism   . Dementia   . Chronic kidney disease   . Self-catheterizes urinary bladder     bid-   . History of blood transfusion   . Anemia   . HOH (hard of hearing)    Past Surgical History  Procedure Laterality Date  . Coronary artery bypass graft  [place]  . Cardiac catheterization   2005    medical therapy  . Cardiac defibrillator placement      ICD implanted by Dr Rayann Heman, Analyze ST study patient  . Esophagogastroduodenoscopy N/A 08/25/2014    Procedure: ESOPHAGOGASTRODUODENOSCOPY (EGD);  Surgeon: Lear Ng, MD;  Location: [facility];  Service: Endoscopy;  Laterality: N/A;  . Colonoscopy N/A 08/25/2014    Procedure: COLONOSCOPY;  Surgeon: Lear Ng, MD;  Location: [facility];  Service: Endoscopy;  Laterality: N/A;  . Eye surgery Bilateral     cartaract  . Joint replacement Right 2004  . Av fistula placement Right 05/01/2015    Procedure: RIGHT RADIOCEPHALIC ARTERIOVENOUS (AV) FISTULA CREATION;  Surgeon: Elam Dutch, MD;  Location: [facility] OR;  Service: Vascular;  Laterality: Right;     Current Outpatient Prescriptions  Medication Sig Dispense Refill  . Acetaminophen (ARTHRITIS PAIN RELIEF PO) Take 650 mg by mouth 2 (two) times daily.     Marland Kitchen aspirin EC 81 MG tablet Take 81 mg by mouth daily.    . bimatoprost (LUMIGAN) 0.03 % ophthalmic solution Place 1 drop into both eyes at bedtime.     . calcitRIOL (ROCALTROL) 0.25 MCG capsule Take 0.25 mcg by mouth daily.    . cetirizine (ZYRTEC) 10 MG tablet Take 5 mg by mouth daily.      . cholecalciferol (VITAMIN  D) 1000 UNITS tablet Take 1,000 Units by mouth daily.    . ciprofloxacin (CIPRO) 500 MG tablet Take 1 tablet (500 mg total) by mouth 2 (two) times daily. 20 tablet 0  . cyanocobalamin 500 MCG tablet Take 500 mcg by mouth daily.    Marland Kitchen donepezil (ARICEPT) 10 MG tablet Take 10 mg by mouth at bedtime.    . fish oil-omega-3 fatty acids 1000 MG capsule Take 2 g by mouth 2 (two) times daily.     Marland Kitchen gabapentin (NEURONTIN) 300 MG capsule Take 300 mg by mouth 2 (two) times daily.     . Insulin Glargine (TOUJEO SOLOSTAR) 300 UNIT/ML SOPN Inject 15 Units into the skin daily. 30 mL 0  . lansoprazole (PREVACID) 30 MG capsule Take 30 mg by mouth daily as needed (for acid reflex).     Marland Kitchen levothyroxine (SYNTHROID,  LEVOTHROID) 50 MCG tablet Take 50 mcg by mouth daily before breakfast.    . MECLIZINE HCL PO Take 5 mg by mouth daily as needed (vertigo). Reported on 12/10/2015    . ONE TOUCH ULTRA TEST test strip TEST 1 TIME DAILY.  3  . simvastatin (ZOCOR) 40 MG tablet Take 40 mg by mouth at bedtime.      . sitaGLIPtin (JANUVIA) 50 MG tablet Take 25 mg by mouth daily with breakfast.     . sodium bicarbonate 650 MG tablet Take 1 tablet (650 mg total) by mouth 2 (two) times daily. 60 tablet 1  . tamsulosin (FLOMAX) 0.4 MG CAPS capsule Take 1 capsule (0.4 mg total) by mouth daily after supper. 30 capsule 1  . traMADol (ULTRAM) 50 MG tablet Take 1 tablet by mouth every 6 (six) hours as needed (pain). For 10 days  1   No current facility-administered medications for this visit.    Allergies:   Metformin and related and Ramipril   Social History:  The patient  reports that he quit smoking about 20 years ago. His smoking use included Cigarettes. He quit after 35 years of use. He does not have any smokeless tobacco history on file. He reports that he does not drink alcohol or use illicit drugs.   Family History:  The patient's family history includes Diabetes in his mother; Parkinsonism in his father; Stomach cancer in his mother.    ROS:  Please see the history of present illness.   All other systems are reviewed and negative.    PHYSICAL EXAM: VS:  BP 142/62 mmHg  Pulse 63  Ht 5\' 11"  (1.803 m)  Wt 189 lb (85.73 kg)  BMI 26.37 kg/m2 , BMI Body mass index is 26.37 kg/(m^2). GEN: Well nourished, well developed, in no acute distress HEENT: normal Neck: no JVD, carotid bruits, or masses Cardiac: RRR; no murmurs, rubs, or gallops,no edema  Respiratory:  clear to auscultation bilaterally, normal work of breathing GI: soft, nontender, nondistended, + BS MS: no deformity or atrophy Skin: warm and dry, device pocket is well healed Neuro:  Strength and sensation are intact Psych: euthymic mood, full  affect  EKG:  EKG is ordered today. The ekg ordered today shows sinus rhythm and PVCs, PR 256, nonspecific ST/T changes, anterior infarction  Device interrogation is reviewed today in detail.  See PaceArt for details.   Recent Labs: 12/11/2015: ALT 11*; BUN 51*; Creatinine, Ser 3.01*; Hemoglobin 10.6*; Platelets 100*; Potassium 4.7; Sodium 140    Lipid Panel  No results found for: CHOL, TRIG, HDL, CHOLHDL, VLDL, LDLCALC, LDLDIRECT   Wt Readings from  Last 3 Encounters:  12/16/15 189 lb (85.73 kg)  12/10/15 184 lb 6.4 oz (83.643 kg)  08/01/15 186 lb (84.369 kg)      ASSESSMENT AND PLAN:  1.  Chronic systolic dysfunction euvolemic Normal ICD function See Pace Art report No changes today Not a candidate for ACEi or ARB given recent renal failure  2. CAD No ischemic symptoms  3. Recent UTI with Klebsiella bacteremia To complete Cipro Hopefully will not have recurrent infection requiring device removal Will monitor for any symptoms of recurrent infection  4. HTN Stable No change required today  5. preop Ok to proceed with hip surgery from my standpoint. No further EP testing required Dr Hassell Done recent phone note is reviewed and he appears to be cleared from his standpoint also.   Current medicines are reviewed at length with the patient today.   The patient does not have concerns regarding his medicines.  The following changes were made today:  none   Follow-up: Merlin Return to see Chanetta Marshall NP in 12 months unless he moves Follow-up with Dr Irish Lack as scheduled  Signed, Thompson Grayer, MD  12/16/2015 5:00 PM     [address] [place] [address] [phone] (office) [phone] (fax)

## 2015-12-17 LAB — HEMOGLOBIN A1C
Hgb A1c MFr Bld: 7.6 % — ABNORMAL HIGH (ref 4.8–5.6)
MEAN PLASMA GLUCOSE: 171 mg/dL

## 2015-12-18 ENCOUNTER — Encounter: Payer: Self-pay | Admitting: *Deleted

## 2015-12-18 ENCOUNTER — Other Ambulatory Visit: Payer: Self-pay | Admitting: *Deleted

## 2015-12-18 DIAGNOSIS — N390 Urinary tract infection, site not specified: Secondary | ICD-10-CM | POA: Diagnosis not present

## 2015-12-18 DIAGNOSIS — A419 Sepsis, unspecified organism: Secondary | ICD-10-CM | POA: Diagnosis not present

## 2015-12-18 DIAGNOSIS — Z9289 Personal history of other medical treatment: Secondary | ICD-10-CM | POA: Diagnosis not present

## 2015-12-18 NOTE — Patient Outreach (Signed)
Initial transition of care call #1. Pt is well know to me as he was my patient last summer and fall. Pt is doing fair. He saw his primary care MD today. He is taking the cipro ordered for him to continue until finished. He is to see his urologist tomorrow. Currently has a foley but this will be dc'd and pt will resume self cathing.   Mrs. Weisner has 2 questions: Is it OK to use the foaming antibacterial soap for his cleansing prior to self cathing?                                               Should he cath bid or only daily?  I told her she should ask Dr. Cipriano Bunker those questions tomorrow.  He has not started dialysis. His glucose levels are running normal most of the time.  I have reminded her that she can call me or our nurseline and gave her both numbers for reference.  I will call her again next week.   Deloria Lair [facility] [place] [phone]

## 2015-12-19 DIAGNOSIS — I10 Essential (primary) hypertension: Secondary | ICD-10-CM | POA: Diagnosis not present

## 2015-12-19 DIAGNOSIS — N401 Enlarged prostate with lower urinary tract symptoms: Secondary | ICD-10-CM | POA: Diagnosis not present

## 2015-12-19 DIAGNOSIS — N184 Chronic kidney disease, stage 4 (severe): Secondary | ICD-10-CM | POA: Diagnosis not present

## 2015-12-19 DIAGNOSIS — Z87440 Personal history of urinary (tract) infections: Secondary | ICD-10-CM | POA: Diagnosis not present

## 2015-12-19 DIAGNOSIS — N138 Other obstructive and reflux uropathy: Secondary | ICD-10-CM | POA: Diagnosis not present

## 2015-12-24 DIAGNOSIS — M1612 Unilateral primary osteoarthritis, left hip: Secondary | ICD-10-CM | POA: Diagnosis not present

## 2015-12-25 ENCOUNTER — Other Ambulatory Visit: Payer: Self-pay | Admitting: *Deleted

## 2015-12-25 DIAGNOSIS — E782 Mixed hyperlipidemia: Secondary | ICD-10-CM | POA: Diagnosis not present

## 2015-12-25 DIAGNOSIS — N184 Chronic kidney disease, stage 4 (severe): Secondary | ICD-10-CM | POA: Diagnosis not present

## 2015-12-25 DIAGNOSIS — E1122 Type 2 diabetes mellitus with diabetic chronic kidney disease: Secondary | ICD-10-CM | POA: Diagnosis not present

## 2015-12-25 DIAGNOSIS — Z794 Long term (current) use of insulin: Secondary | ICD-10-CM | POA: Diagnosis not present

## 2015-12-25 DIAGNOSIS — E1142 Type 2 diabetes mellitus with diabetic polyneuropathy: Secondary | ICD-10-CM | POA: Diagnosis not present

## 2015-12-25 NOTE — Patient Outreach (Signed)
Transition of care call #2. Pt is doing well. He has completed his oral antibiotic. He is now planning for a total hip replacement in May. He has gone for his preop screening. He also has seen Dr. Buddy Duty, his endocrinologist and got a good report from him. His HgbA1C was 7.7. No medication changes were made. He has been to the urologist and the indwelling foley catheter has been discontinued and pt is to resume self cathing bid per Dr. Lorrene Reid.  I have reinforced that they may call me if any problems arise or if they have any questions. I will call again next week.  Deloria Lair [facility] [place] [phone]

## 2015-12-26 DIAGNOSIS — H401211 Low-tension glaucoma, right eye, mild stage: Secondary | ICD-10-CM | POA: Diagnosis not present

## 2015-12-27 DIAGNOSIS — N320 Bladder-neck obstruction: Secondary | ICD-10-CM | POA: Diagnosis not present

## 2015-12-27 DIAGNOSIS — N390 Urinary tract infection, site not specified: Secondary | ICD-10-CM | POA: Diagnosis not present

## 2015-12-27 DIAGNOSIS — D631 Anemia in chronic kidney disease: Secondary | ICD-10-CM | POA: Diagnosis not present

## 2015-12-27 DIAGNOSIS — F0390 Unspecified dementia without behavioral disturbance: Secondary | ICD-10-CM | POA: Diagnosis not present

## 2015-12-27 DIAGNOSIS — I770 Arteriovenous fistula, acquired: Secondary | ICD-10-CM | POA: Diagnosis not present

## 2015-12-27 DIAGNOSIS — I2510 Atherosclerotic heart disease of native coronary artery without angina pectoris: Secondary | ICD-10-CM | POA: Diagnosis not present

## 2015-12-27 DIAGNOSIS — N189 Chronic kidney disease, unspecified: Secondary | ICD-10-CM | POA: Diagnosis not present

## 2015-12-27 DIAGNOSIS — M1612 Unilateral primary osteoarthritis, left hip: Secondary | ICD-10-CM | POA: Diagnosis not present

## 2015-12-27 DIAGNOSIS — E119 Type 2 diabetes mellitus without complications: Secondary | ICD-10-CM | POA: Diagnosis not present

## 2015-12-27 DIAGNOSIS — N2581 Secondary hyperparathyroidism of renal origin: Secondary | ICD-10-CM | POA: Diagnosis not present

## 2016-01-01 ENCOUNTER — Other Ambulatory Visit: Payer: Self-pay | Admitting: *Deleted

## 2016-01-01 NOTE — Patient Outreach (Addendum)
Transition of care call #3 - Spoke with Mrs. Pekala today. She reports pt has seen his nephrologist and had a good report that his creatinine level had improved slightly. Pt is not self cathing in the am as he is able to void some. He continued to cath at night. He will see the orthopedist tomorrow for his pre-op screening for hip surgery May 23rd.  I have reminded them to call me for any questions or problems. I will call them next week.  Deloria Lair GNP-BC [place] [phone]   Patient was recently discharged from hospital and all medications have been reviewed. This was done on pt initial transition of care call on 12/18/15.

## 2016-01-06 NOTE — H&P (Signed)
TOTAL HIP ADMISSION H&P  Patient is admitted for left total hip arthroplasty, anterior approach.  Subjective:  Chief Complaint: Left hip primary OA / pain  HPI: Patrick Boyle, 82 y.o. male, has a history of pain and functional disability in the left hip(s) due to arthritis and patient has failed non-surgical conservative treatments for greater than 12 weeks to include NSAID's and/or analgesics and activity modification.  Onset of symptoms was gradual starting 1+ years ago with gradually worsening course since that time.The patient noted prior procedures of the hip to include arthroplasty on the right hip in [place] in 2004.  Patient currently rates pain in the left hip at 8 out of 10 with activity. Patient has worsening of pain with activity and weight bearing, trendelenberg gait, pain that interfers with activities of daily living and pain with passive range of motion. Patient has evidence of periarticular osteophytes and joint space narrowing by imaging studies. This condition presents safety issues increasing the risk of falls.   There is no current active infection.   Risks, benefits and expectations were discussed with the patient.  Risks including but not limited to the risk of anesthesia, blood clots, nerve damage, blood vessel damage, failure of the prosthesis, infection and up to and including death.  Patient understand the risks, benefits and expectations and wishes to proceed with surgery.    PCP: Orpah Melter, MD  D/C Plans:      Home  Post-op Meds:       No Rx given  Tranexamic Acid:      To be given - IV    Decadron:     Iis to be given  FYI:     Xarelto then ASA  (CAD)  Norco  No HH  NO DME needs   NO BP or needles in the right arm   Patient Active Problem List   Diagnosis Date Noted  . CKD (chronic kidney disease) stage 4, GFR 15-29 ml/min (HCC) 12/11/2015  . Uncontrolled diabetes mellitus with diabetic nephropathy, with long-term current use of insulin ([place])  12/11/2015  . Hypothyroidism 12/11/2015  . Thrombocytopenia ([place]) 12/11/2015  . UTI (urinary tract infection) 12/10/2015  . UTI (urinary tract infection) due to urinary indwelling catheter ([place]) 12/10/2015  . Anemia 08/25/2014  . GI bleed 08/25/2014  . CKD (chronic kidney disease)   . Sepsis secondary to UTI ([place]) 08/21/2014  . Febrile illness, acute   . Fever 08/08/2014  . Acute renal failure ([place]) 08/08/2014  . COPD (chronic obstructive pulmonary disease) ([place]) 08/08/2014  . Syncope 08/18/2013  . Automatic implantable cardioverter-defibrillator in situ 08/02/2012  . Chronic systolic congestive heart failure ([place]) 05/18/2011  . PULMONARY NODULE 09/24/2010  . COPD, MILD 05/08/2010  . Diabetes mellitus ([place]) 09/02/2009  . Hyperlipemia 09/02/2009  . Essential hypertension 09/02/2009  . Coronary atherosclerosis 09/02/2009  . ALLERGIC RHINITIS 09/02/2009  . GERD 09/02/2009  . HEMOPTYSIS UNSPECIFIED 09/02/2009   Past Medical History  Diagnosis Date  . Hyperlipidemia   . Hypertension   . Coronary artery disease     s/p CABG 1997  . GERD (gastroesophageal reflux disease)   . Arthritis   . Allergic rhinitis   . Hyperplastic colon polyp   . Diverticulitis, colon   . Meniere's syndrome   . Tremor, essential     dr Jannifer Franklin- bengin   . COPD, mild ([place])     dr clance  . Ischemic cardiomyopathy     sp ICD (SJM) implanted by Dr Rayann Heman  .  MI (myocardial infarction) ([place]) 1997, 2005  . AICD (automatic cardioverter/defibrillator) present   . Presence of permanent cardiac pacemaker   . CHF (congestive heart failure) ([place])   . Diabetes mellitus     dr Olen Pel- Type II  . Hypothyroidism   . Dementia   . Chronic kidney disease   . Self-catheterizes urinary bladder     bid-   . History of blood transfusion   . Anemia   . HOH (hard of hearing)     Past Surgical History  Procedure Laterality Date  . Coronary artery bypass graft  [place]  . Cardiac catheterization  2005     medical therapy  . Cardiac defibrillator placement      ICD implanted by Dr Rayann Heman, Analyze ST study patient  . Esophagogastroduodenoscopy N/A 08/25/2014    Procedure: ESOPHAGOGASTRODUODENOSCOPY (EGD);  Surgeon: Lear Ng, MD;  Location: [facility];  Service: Endoscopy;  Laterality: N/A;  . Colonoscopy N/A 08/25/2014    Procedure: COLONOSCOPY;  Surgeon: Lear Ng, MD;  Location: [facility];  Service: Endoscopy;  Laterality: N/A;  . Eye surgery Bilateral     cartaract  . Joint replacement Right 2004  . Av fistula placement Right 05/01/2015    Procedure: RIGHT RADIOCEPHALIC ARTERIOVENOUS (AV) FISTULA CREATION;  Surgeon: Elam Dutch, MD;  Location: [facility];  Service: Vascular;  Laterality: Right;    No prescriptions prior to admission   Allergies  Allergen Reactions  . Metformin And Related Other (See Comments)    GI sensitivity with high doses  . Ramipril Cough    Cough    Social History  Substance Use Topics  . Smoking status: Former Smoker -- 35 years    Types: Cigarettes    Quit date: 09/01/1995  . Smokeless tobacco: Not on file  . Alcohol Use: No     Comment: occasional scotch    Family History  Problem Relation Age of Onset  . Parkinsonism Father   . Stomach cancer Mother   . Diabetes Mother      Review of Systems  Constitutional: Negative.   HENT: Negative.   Eyes: Negative.   Respiratory: Negative.   Cardiovascular: Negative.   Gastrointestinal: Positive for heartburn.  Genitourinary: Negative.   Musculoskeletal: Positive for joint pain.  Skin: Negative.   Neurological: Positive for tremors.  Endo/Heme/Allergies: Positive for environmental allergies.  Psychiatric/Behavioral: Negative.     Objective:  Physical Exam  Constitutional: He is oriented to person, place, and time. He appears well-developed.  HENT:  Head: Normocephalic.  Eyes: Pupils are equal, round, and reactive to light.  Neck: Neck supple. No JVD present. No  tracheal deviation present. No thyromegaly present.  Cardiovascular: Normal rate, regular rhythm and intact distal pulses.   Pacemaker  Respiratory: Effort normal and breath sounds normal. No stridor. No respiratory distress. He has no wheezes.  GI: Soft. There is no tenderness. There is no guarding.  Musculoskeletal:       Left hip: He exhibits decreased range of motion, decreased strength, tenderness and bony tenderness. He exhibits no swelling, no deformity and no laceration.  Lymphadenopathy:    He has no cervical adenopathy.  Neurological: He is alert and oriented to person, place, and time. A sensory deficit (Bilateral LE neuropathy) is present.  Skin: Skin is warm and dry.  Psychiatric: He has a normal mood and affect.      Labs:  Estimated body mass index is 26.30 kg/(m^2) as calculated from the following:  Height as of 08/01/15: 5' 10.5" (1.791 m).   Weight as of 08/01/15: 84.369 kg (186 lb).   Imaging Review Plain radiographs demonstrate severe degenerative joint disease of the left hip(s). The bone quality appears to be good for age and reported activity level.  Assessment/Plan:  End stage arthritis, left hip(s)  The patient history, physical examination, clinical judgement of the provider and imaging studies are consistent with end stage degenerative joint disease of the left hip(s) and total hip arthroplasty is deemed medically necessary. The treatment options including medical management, injection therapy, arthroscopy and arthroplasty were discussed at length. The risks and benefits of total hip arthroplasty were presented and reviewed. The risks due to aseptic loosening, infection, stiffness, dislocation/subluxation,  thromboembolic complications and other imponderables were discussed.  The patient acknowledged the explanation, agreed to proceed with the plan and consent was signed. Patient is being admitted for inpatient treatment for surgery, pain control, PT, OT,  prophylactic antibiotics, VTE prophylaxis, progressive ambulation and ADL's and discharge planning.The patient is planning to be discharged home.       [place] Pugh Shaquera Ansley   PA-C  01/06/2016, 7:30 AM

## 2016-01-07 DIAGNOSIS — R0981 Nasal congestion: Secondary | ICD-10-CM | POA: Diagnosis not present

## 2016-01-08 ENCOUNTER — Other Ambulatory Visit: Payer: Self-pay | Admitting: *Deleted

## 2016-01-08 NOTE — Patient Outreach (Signed)
Transition of care call. Patrick Boyle report Patrick Boyle saw his provider yesterday for a sinus infection and was started on an antibiotic. He is now self cathing bid and is not having any urinary tract infection sxs. He has had his preop visit for his [place] total hip replacement scheduled for May 23rd. Patrick Boyle requests an Rx for pt pain medication which I have prescribed for him before. I will write an Rx for Tramadol 50 mg one po q6 hours prn for hip pain #30, no refills and will drop it off at pt's pharmacy tomorrow morning. I will call them next Wednesday for another transition of care call.  Deloria Lair [facility] [place] [phone]

## 2016-01-13 ENCOUNTER — Encounter (HOSPITAL_COMMUNITY): Payer: Self-pay

## 2016-01-13 ENCOUNTER — Encounter (HOSPITAL_COMMUNITY)
Admission: RE | Admit: 2016-01-13 | Discharge: 2016-01-13 | Disposition: A | Discharge status: Home / Self Care | Payer: Medicare Other | Source: Ambulatory Visit | Attending: Orthopedic Surgery | Admitting: Orthopedic Surgery

## 2016-01-13 DIAGNOSIS — M25552 Pain in left hip: Secondary | ICD-10-CM | POA: Diagnosis not present

## 2016-01-13 DIAGNOSIS — Z01812 Encounter for preprocedural laboratory examination: Secondary | ICD-10-CM | POA: Insufficient documentation

## 2016-01-13 DIAGNOSIS — M1612 Unilateral primary osteoarthritis, left hip: Secondary | ICD-10-CM | POA: Insufficient documentation

## 2016-01-13 LAB — SURGICAL PCR SCREEN
MRSA, PCR: POSITIVE — AB
STAPHYLOCOCCUS AUREUS: POSITIVE — AB

## 2016-01-13 LAB — BASIC METABOLIC PANEL
ANION GAP: 6 (ref 5–15)
BUN: 43 mg/dL — ABNORMAL HIGH (ref 6–20)
CO2: 25 mmol/L (ref 22–32)
Calcium: 9.3 mg/dL (ref 8.9–10.3)
Chloride: 109 mmol/L (ref 101–111)
Creatinine, Ser: 2.6 mg/dL — ABNORMAL HIGH (ref 0.61–1.24)
GFR, EST AFRICAN AMERICAN: 25 mL/min — AB (ref >60)
GFR, EST NON AFRICAN AMERICAN: 22 mL/min — AB (ref >60)
GLUCOSE: 118 mg/dL — AB (ref 65–99)
POTASSIUM: 5.4 mmol/L — AB (ref 3.5–5.1)
Sodium: 140 mmol/L (ref 135–145)

## 2016-01-13 LAB — CBC
HEMATOCRIT: 33.9 % — AB (ref 39.0–52.0)
HEMOGLOBIN: 11.1 g/dL — AB (ref 13.0–17.0)
MCH: 31.1 pg (ref 26.0–34.0)
MCHC: 32.7 g/dL (ref 30.0–36.0)
MCV: 95 fL (ref 78.0–100.0)
Platelets: 153 10*3/uL (ref 150–400)
RBC: 3.57 MIL/uL — AB (ref 4.22–5.81)
RDW: 13.6 % (ref 11.5–15.5)
WBC: 7 10*3/uL (ref 4.0–10.5)

## 2016-01-13 NOTE — Patient Instructions (Signed)
Patrick Boyle  01/13/2016   Your procedure is scheduled on: Tuesday 01/21/2016  Report to [facility] Main  Entrance take [facility]  elevators to 3rd floor to  [place] at   6 00  AM.  Call this number if you have problems the morning of surgery [phone]   Remember: ONLY 1 PERSON MAY GO WITH YOU TO SHORT STAY TO GET  READY MORNING OF [place].   Do not eat food or drink liquids :After Midnight.   TAKE 1/2 DOSE OF EVENING INSULIN-TOUJEO NIGHT BEFORE SURGERY!   Take these medicines the morning of surgery with A SIP OF WATER: Gabapentin (Neurontin), Lansoprazole (Prevacid), Levothyroxine              DO NOT TAKE ANY DIABETIC MEDICATIONS DAY OF YOUR SURGERY!                               You may not have any metal on your body including hair pins and              piercings  Do not wear jewelry, make-up, lotions, powders or perfumes, deodorant             Do not wear nail polish.  Do not shave  48 hours prior to surgery.              Men may shave face and neck.   Do not bring valuables to the hospital. [place].  Contacts, dentures or bridgework may not be worn into surgery.  Leave suitcase in the car. After surgery it may be brought to your room.                  Please read over the following fact sheets you were given: _____________________________________________________________________             [facility] - Preparing for Surgery Before surgery, you can play an important role.  Because skin is not sterile, your skin needs to be as free of germs as possible.  You can reduce the number of germs on your skin by washing with CHG (chlorahexidine gluconate) soap before surgery.  CHG is an antiseptic cleaner which kills germs and bonds with the skin to continue killing germs even after washing. Please DO NOT use if you have an allergy to CHG or antibacterial soaps.  If your skin becomes  reddened/irritated stop using the CHG and inform your nurse when you arrive at Short Stay. Do not shave (including legs and underarms) for at least 48 hours prior to the first CHG shower.  You may shave your face/neck. Please follow these instructions carefully:  1.  Shower with CHG Soap the night before surgery and the  morning of Surgery.  2.  If you choose to wash your hair, wash your hair first as usual with your  normal  shampoo.  3.  After you shampoo, rinse your hair and body thoroughly to remove the  shampoo.                           4.  Use CHG as you would any other liquid soap.  You can apply chg directly  to the skin and wash                       Gently with a scrungie or clean washcloth.  5.  Apply the CHG Soap to your body ONLY FROM THE NECK DOWN.   Do not use on face/ open                           Wound or open sores. Avoid contact with eyes, ears mouth and genitals (private parts).                       Wash face,  Genitals (private parts) with your normal soap.             6.  Wash thoroughly, paying special attention to the area where your surgery  will be performed.  7.  Thoroughly rinse your body with warm water from the neck down.  8.  DO NOT shower/wash with your normal soap after using and rinsing off  the CHG Soap.                9.  Pat yourself dry with a clean towel.            10.  Wear clean pajamas.            11.  Place clean sheets on your bed the night of your first shower and do not  sleep with pets. Day of Surgery : Do not apply any lotions/deodorants the morning of surgery.  Please wear clean clothes to the hospital/surgery center.  FAILURE TO FOLLOW THESE INSTRUCTIONS MAY RESULT IN THE CANCELLATION OF YOUR SURGERY PATIENT SIGNATURE_________________________________  NURSE SIGNATURE__________________________________  ________________________________________________________________________   Patrick Boyle  An incentive spirometer is a tool that  can help keep your lungs clear and active. This tool measures how well you are filling your lungs with each breath. Taking long deep breaths may help reverse or decrease the chance of developing breathing (pulmonary) problems (especially infection) following:  A long period of time when you are unable to move or be active. BEFORE THE PROCEDURE   If the spirometer includes an indicator to show your best effort, your nurse or respiratory therapist will set it to a desired goal.  If possible, sit up straight or lean slightly forward. Try not to slouch.  Hold the incentive spirometer in an upright position. INSTRUCTIONS FOR USE   Sit on the edge of your bed if possible, or sit up as far as you can in bed or on a chair.  Hold the incentive spirometer in an upright position.  Breathe out normally.  Place the mouthpiece in your mouth and seal your lips tightly around it.  Breathe in slowly and as deeply as possible, raising the piston or the ball toward the top of the column.  Hold your breath for 3-5 seconds or for as long as possible. Allow the piston or ball to fall to the bottom of the column.  Remove the mouthpiece from your mouth and breathe out normally.  Rest for a few seconds and repeat Steps 1 through 7 at least 10 times every 1-2 hours when you are awake. Take your time and take a few normal breaths between deep breaths.  The spirometer may include an indicator to show your best effort. Use the indicator as a goal to work toward during each repetition.  After each  set of 10 deep breaths, practice coughing to be sure your lungs are clear. If you have an incision (the cut made at the time of surgery), support your incision when coughing by placing a pillow or rolled up towels firmly against it. Once you are able to get out of bed, walk around indoors and cough well. You may stop using the incentive spirometer when instructed by your caregiver.  RISKS AND COMPLICATIONS  Take your  time so you do not get dizzy or light-headed.  If you are in pain, you may need to take or ask for pain medication before doing incentive spirometry. It is harder to take a deep breath if you are having pain. AFTER USE  Rest and breathe slowly and easily.  It can be helpful to keep track of a log of your progress. Your caregiver can provide you with a simple table to help with this. If you are using the spirometer at home, follow these instructions: [place] IF:   You are having difficultly using the spirometer.  You have trouble using the spirometer as often as instructed.  Your pain medication is not giving enough relief while using the spirometer.  You develop fever of 100.5 F (38.1 C) or higher. SEEK IMMEDIATE MEDICAL CARE IF:   You cough up bloody sputum that had not been present before.  You develop fever of 102 F (38.9 C) or greater.  You develop worsening pain at or near the incision site. MAKE SURE YOU:   Understand these instructions.  Will watch your condition.  Will get help right away if you are not doing well or get worse. Document Released: 12/28/2006 Document Revised: 11/09/2011 Document Reviewed: 02/28/2007 ExitCare Patient Information 2014 Exit[employer], [place].   ________________________________________________________________________  WHAT IS A BLOOD TRANSFUSION? Blood Transfusion Information  A transfusion is the replacement of blood or some of its parts. Blood is made up of multiple cells which provide different functions.  Red blood cells carry oxygen and are used for blood loss replacement.  White blood cells fight against infection.  Platelets control bleeding.  Plasma helps clot blood.  Other blood products are available for specialized needs, such as hemophilia or other clotting disorders. BEFORE THE TRANSFUSION  Who gives blood for transfusions?   Healthy volunteers who are fully evaluated to make sure their blood is safe. This  is blood bank blood. Transfusion therapy is the safest it has ever been in the practice of medicine. Before blood is taken from a donor, a complete history is taken to make sure that person has no history of diseases nor engages in risky social behavior (examples are intravenous drug use or sexual activity with multiple partners). The donor's travel history is screened to minimize risk of transmitting infections, such as malaria. The donated blood is tested for signs of infectious diseases, such as HIV and hepatitis. The blood is then tested to be sure it is compatible with you in order to minimize the chance of a transfusion reaction. If you or a relative donates blood, this is often done in anticipation of surgery and is not appropriate for emergency situations. It takes many days to process the donated blood. RISKS AND COMPLICATIONS Although transfusion therapy is very safe and saves many lives, the main dangers of transfusion include:   Getting an infectious disease.  Developing a transfusion reaction. This is an allergic reaction to something in the blood you were given. Every precaution is taken to prevent this. The decision to have  a blood transfusion has been considered carefully by your caregiver before blood is given. Blood is not given unless the benefits outweigh the risks. AFTER THE TRANSFUSION  Right after receiving a blood transfusion, you will usually feel much better and more energetic. This is especially true if your red blood cells have gotten low (anemic). The transfusion raises the level of the red blood cells which carry oxygen, and this usually causes an energy increase.  The nurse administering the transfusion will monitor you carefully for complications. HOME CARE INSTRUCTIONS  No special instructions are needed after a transfusion. You may find your energy is better. Speak with your caregiver about any limitations on activity for underlying diseases you may have. SEEK  MEDICAL CARE IF:   Your condition is not improving after your transfusion.  You develop redness or irritation at the intravenous (IV) site. SEEK IMMEDIATE MEDICAL CARE IF:  Any of the following symptoms occur over the next 12 hours:  Shaking chills.  You have a temperature by mouth above 102 F (38.9 C), not controlled by medicine.  Chest, back, or muscle pain.  People around you feel you are not acting correctly or are confused.  Shortness of breath or difficulty breathing.  Dizziness and fainting.  You get a rash or develop hives.  You have a decrease in urine output.  Your urine turns a dark color or changes to pink, red, or brown. Any of the following symptoms occur over the next 10 days:  You have a temperature by mouth above 102 F (38.9 C), not controlled by medicine.  Shortness of breath.  Weakness after normal activity.  The white part of the eye turns yellow (jaundice).  You have a decrease in the amount of urine or are urinating less often.  Your urine turns a dark color or changes to pink, red, or brown. Document Released: 08/14/2000 Document Revised: 11/09/2011 Document Reviewed: 04/02/2008 [facility] Patient Information 2014 [place], [place].  _______________________________________________________________________

## 2016-01-13 NOTE — Progress Notes (Signed)
12/11/2015-noted in [facility], EKG 12/06/2015-cardiac clearance from Dr. Irish Lack on chart 09/16/2015-LOV from [facility]- Dr. Jamal Maes on chart

## 2016-01-15 ENCOUNTER — Other Ambulatory Visit: Payer: Self-pay | Admitting: *Deleted

## 2016-01-15 NOTE — Patient Outreach (Signed)
Transition of care call #5. Pt is doing well respective to his resolved UTI. He has had his preop screening, he will have a Lhip replacement next Tuesday. His nostril swab was positive for MRSA and he was prescribed mupirocin ointment to apply in each nostril by qtip bid. He has all his preop instructions and understands them. He will probably come home after the surgery. I have told them I will follow along to see when he is discharged home and call them to make sure he has all he needs.  Patrick Boyle [phone]

## 2016-01-21 ENCOUNTER — Inpatient Hospital Stay (HOSPITAL_COMMUNITY): Payer: Medicare Other

## 2016-01-21 ENCOUNTER — Encounter (HOSPITAL_COMMUNITY)
Admission: RE | Disposition: A | Discharge status: Home Health | Payer: Self-pay | Source: Ambulatory Visit | Attending: Orthopedic Surgery

## 2016-01-21 ENCOUNTER — Inpatient Hospital Stay (HOSPITAL_COMMUNITY): Payer: Medicare Other | Admitting: Anesthesiology

## 2016-01-21 ENCOUNTER — Encounter (HOSPITAL_COMMUNITY): Payer: Self-pay | Admitting: *Deleted

## 2016-01-21 ENCOUNTER — Inpatient Hospital Stay (HOSPITAL_COMMUNITY)
Admission: RE | Admit: 2016-01-21 | Discharge: 2016-01-22 | DRG: 470 | Disposition: A | Discharge status: Home Health | Payer: Medicare Other | Source: Ambulatory Visit | Attending: Orthopedic Surgery | Admitting: Orthopedic Surgery

## 2016-01-21 DIAGNOSIS — Z833 Family history of diabetes mellitus: Secondary | ICD-10-CM | POA: Diagnosis not present

## 2016-01-21 DIAGNOSIS — E039 Hypothyroidism, unspecified: Secondary | ICD-10-CM | POA: Diagnosis present

## 2016-01-21 DIAGNOSIS — E663 Overweight: Secondary | ICD-10-CM | POA: Diagnosis present

## 2016-01-21 DIAGNOSIS — Z96642 Presence of left artificial hip joint: Secondary | ICD-10-CM | POA: Diagnosis not present

## 2016-01-21 DIAGNOSIS — M25552 Pain in left hip: Secondary | ICD-10-CM

## 2016-01-21 DIAGNOSIS — F0390 Unspecified dementia without behavioral disturbance: Secondary | ICD-10-CM | POA: Diagnosis present

## 2016-01-21 DIAGNOSIS — Z6826 Body mass index (BMI) 26.0-26.9, adult: Secondary | ICD-10-CM | POA: Diagnosis not present

## 2016-01-21 DIAGNOSIS — E785 Hyperlipidemia, unspecified: Secondary | ICD-10-CM | POA: Diagnosis present

## 2016-01-21 DIAGNOSIS — Z87891 Personal history of nicotine dependence: Secondary | ICD-10-CM | POA: Diagnosis not present

## 2016-01-21 DIAGNOSIS — K219 Gastro-esophageal reflux disease without esophagitis: Secondary | ICD-10-CM | POA: Diagnosis present

## 2016-01-21 DIAGNOSIS — Z794 Long term (current) use of insulin: Secondary | ICD-10-CM | POA: Diagnosis not present

## 2016-01-21 DIAGNOSIS — Z96649 Presence of unspecified artificial hip joint: Secondary | ICD-10-CM

## 2016-01-21 DIAGNOSIS — Z95810 Presence of automatic (implantable) cardiac defibrillator: Secondary | ICD-10-CM | POA: Diagnosis not present

## 2016-01-21 DIAGNOSIS — I5022 Chronic systolic (congestive) heart failure: Secondary | ICD-10-CM | POA: Diagnosis present

## 2016-01-21 DIAGNOSIS — E1122 Type 2 diabetes mellitus with diabetic chronic kidney disease: Secondary | ICD-10-CM | POA: Diagnosis present

## 2016-01-21 DIAGNOSIS — I252 Old myocardial infarction: Secondary | ICD-10-CM | POA: Diagnosis not present

## 2016-01-21 DIAGNOSIS — Z79899 Other long term (current) drug therapy: Secondary | ICD-10-CM | POA: Diagnosis not present

## 2016-01-21 DIAGNOSIS — M1612 Unilateral primary osteoarthritis, left hip: Secondary | ICD-10-CM | POA: Diagnosis not present

## 2016-01-21 DIAGNOSIS — H9190 Unspecified hearing loss, unspecified ear: Secondary | ICD-10-CM | POA: Diagnosis present

## 2016-01-21 DIAGNOSIS — Z471 Aftercare following joint replacement surgery: Secondary | ICD-10-CM | POA: Diagnosis not present

## 2016-01-21 DIAGNOSIS — J449 Chronic obstructive pulmonary disease, unspecified: Secondary | ICD-10-CM | POA: Diagnosis present

## 2016-01-21 DIAGNOSIS — I2510 Atherosclerotic heart disease of native coronary artery without angina pectoris: Secondary | ICD-10-CM | POA: Diagnosis present

## 2016-01-21 DIAGNOSIS — N189 Chronic kidney disease, unspecified: Secondary | ICD-10-CM | POA: Diagnosis not present

## 2016-01-21 DIAGNOSIS — I130 Hypertensive heart and chronic kidney disease with heart failure and stage 1 through stage 4 chronic kidney disease, or unspecified chronic kidney disease: Secondary | ICD-10-CM | POA: Diagnosis present

## 2016-01-21 DIAGNOSIS — N184 Chronic kidney disease, stage 4 (severe): Secondary | ICD-10-CM | POA: Diagnosis present

## 2016-01-21 DIAGNOSIS — Z951 Presence of aortocoronary bypass graft: Secondary | ICD-10-CM

## 2016-01-21 DIAGNOSIS — I129 Hypertensive chronic kidney disease with stage 1 through stage 4 chronic kidney disease, or unspecified chronic kidney disease: Secondary | ICD-10-CM | POA: Diagnosis not present

## 2016-01-21 HISTORY — PX: TOTAL HIP ARTHROPLASTY: SHX124

## 2016-01-21 LAB — GLUCOSE, CAPILLARY
GLUCOSE-CAPILLARY: 127 mg/dL — AB (ref 65–99)
Glucose-Capillary: 113 mg/dL — ABNORMAL HIGH (ref 65–99)
Glucose-Capillary: 204 mg/dL — ABNORMAL HIGH (ref 65–99)
Glucose-Capillary: 252 mg/dL — ABNORMAL HIGH (ref 65–99)

## 2016-01-21 LAB — TYPE AND SCREEN
ABO/RH(D): O POS
Antibody Screen: NEGATIVE

## 2016-01-21 LAB — POCT I-STAT 4, (NA,K, GLUC, HGB,HCT)
Glucose, Bld: 105 mg/dL — ABNORMAL HIGH (ref 65–99)
HEMATOCRIT: 32 % — AB (ref 39.0–52.0)
HEMOGLOBIN: 10.9 g/dL — AB (ref 13.0–17.0)
POTASSIUM: 4.7 mmol/L (ref 3.5–5.1)
SODIUM: 142 mmol/L (ref 135–145)

## 2016-01-21 SURGERY — ARTHROPLASTY, HIP, TOTAL, ANTERIOR APPROACH
Anesthesia: Spinal | Site: Hip | Laterality: Left

## 2016-01-21 MED ORDER — METHOCARBAMOL 500 MG PO TABS
500.0000 mg | ORAL_TABLET | Freq: Four times a day (QID) | ORAL | Status: DC | PRN
  Administered 2016-01-21 – 2016-01-22 (×2): 500 mg via ORAL
  Filled 2016-01-21 (×2): qty 1

## 2016-01-21 MED ORDER — POLYETHYLENE GLYCOL 3350 17 G PO PACK
17.0000 g | PACK | Freq: Two times a day (BID) | ORAL | Status: DC
  Administered 2016-01-22: 17 g via ORAL
  Filled 2016-01-21: qty 1

## 2016-01-21 MED ORDER — SIMVASTATIN 20 MG PO TABS
40.0000 mg | ORAL_TABLET | Freq: Every day | ORAL | Status: DC
  Administered 2016-01-21: 40 mg via ORAL
  Filled 2016-01-21: qty 2

## 2016-01-21 MED ORDER — PHENOL 1.4 % MT LIQD: 1.0000 | OROMUCOSAL | Status: DC | PRN

## 2016-01-21 MED ORDER — CHLORHEXIDINE GLUCONATE 4 % EX LIQD: 60.0000 mL | Freq: Once | CUTANEOUS | Status: DC

## 2016-01-21 MED ORDER — MIDAZOLAM HCL 5 MG/5ML IJ SOLN
INTRAMUSCULAR | Status: DC | PRN
  Administered 2016-01-21 (×2): 0.5 mg via INTRAVENOUS

## 2016-01-21 MED ORDER — DEXAMETHASONE SODIUM PHOSPHATE 10 MG/ML IJ SOLN
INTRAMUSCULAR | Status: AC
  Filled 2016-01-21: qty 1

## 2016-01-21 MED ORDER — ONDANSETRON HCL 4 MG/2ML IJ SOLN: 4.0000 mg | Freq: Four times a day (QID) | INTRAMUSCULAR | Status: DC | PRN

## 2016-01-21 MED ORDER — HYDROMORPHONE HCL 1 MG/ML IJ SOLN: 0.2500 mg | INTRAMUSCULAR | Status: DC | PRN

## 2016-01-21 MED ORDER — SODIUM CHLORIDE 0.9 % IR SOLN
Status: DC | PRN
  Administered 2016-01-21: 1000 mL

## 2016-01-21 MED ORDER — MECLIZINE HCL 25 MG PO TABS
25.0000 mg | ORAL_TABLET | Freq: Four times a day (QID) | ORAL | Status: DC | PRN
  Filled 2016-01-21: qty 1

## 2016-01-21 MED ORDER — INSULIN ASPART 100 UNIT/ML ~~LOC~~ SOLN
0.0000 [IU] | Freq: Three times a day (TID) | SUBCUTANEOUS | Status: DC
  Administered 2016-01-21: 5 [IU] via SUBCUTANEOUS
  Administered 2016-01-22: 3 [IU] via SUBCUTANEOUS

## 2016-01-21 MED ORDER — LINAGLIPTIN 5 MG PO TABS
5.0000 mg | ORAL_TABLET | Freq: Every day | ORAL | Status: DC
  Administered 2016-01-21 – 2016-01-22 (×2): 5 mg via ORAL
  Filled 2016-01-21 (×2): qty 1

## 2016-01-21 MED ORDER — METHOCARBAMOL 1000 MG/10ML IJ SOLN
500.0000 mg | Freq: Four times a day (QID) | INTRAVENOUS | Status: DC | PRN
  Administered 2016-01-21: 500 mg via INTRAVENOUS
  Filled 2016-01-21: qty 550
  Filled 2016-01-21: qty 5

## 2016-01-21 MED ORDER — SODIUM CHLORIDE 0.9 % IV SOLN
INTRAVENOUS | Status: DC
  Administered 2016-01-21: 13:00:00 via INTRAVENOUS

## 2016-01-21 MED ORDER — ALUM & MAG HYDROXIDE-SIMETH 200-200-20 MG/5ML PO SUSP: 30.0000 mL | ORAL | Status: DC | PRN

## 2016-01-21 MED ORDER — FENTANYL CITRATE (PF) 100 MCG/2ML IJ SOLN
INTRAMUSCULAR | Status: AC
  Filled 2016-01-21: qty 2

## 2016-01-21 MED ORDER — MAGNESIUM CITRATE PO SOLN: 1.0000 | Freq: Once | ORAL | Status: DC | PRN

## 2016-01-21 MED ORDER — CEFAZOLIN SODIUM-DEXTROSE 2-4 GM/100ML-% IV SOLN
INTRAVENOUS | Status: AC
  Filled 2016-01-21: qty 100

## 2016-01-21 MED ORDER — MIDAZOLAM HCL 2 MG/2ML IJ SOLN
INTRAMUSCULAR | Status: AC
  Filled 2016-01-21: qty 2

## 2016-01-21 MED ORDER — FERROUS SULFATE 325 (65 FE) MG PO TABS
325.0000 mg | ORAL_TABLET | Freq: Three times a day (TID) | ORAL | Status: DC
  Administered 2016-01-22: 325 mg via ORAL
  Filled 2016-01-21: qty 1

## 2016-01-21 MED ORDER — VANCOMYCIN HCL 1000 MG IV SOLR
1000.0000 mg | INTRAVENOUS | Status: DC | PRN
  Administered 2016-01-21: 1000 mg via INTRAVENOUS

## 2016-01-21 MED ORDER — PHENYLEPHRINE HCL 10 MG/ML IJ SOLN
INTRAMUSCULAR | Status: AC
  Filled 2016-01-21: qty 1

## 2016-01-21 MED ORDER — BUPIVACAINE IN DEXTROSE 0.75-8.25 % IT SOLN
INTRATHECAL | Status: DC | PRN
  Administered 2016-01-21: 2 mL via INTRATHECAL

## 2016-01-21 MED ORDER — LORATADINE 10 MG PO TABS
10.0000 mg | ORAL_TABLET | Freq: Every day | ORAL | Status: DC
  Administered 2016-01-21 – 2016-01-22 (×2): 10 mg via ORAL
  Filled 2016-01-21 (×2): qty 1

## 2016-01-21 MED ORDER — CELECOXIB 200 MG PO CAPS
200.0000 mg | ORAL_CAPSULE | Freq: Two times a day (BID) | ORAL | Status: DC
  Administered 2016-01-21 – 2016-01-22 (×2): 200 mg via ORAL
  Filled 2016-01-21 (×2): qty 1

## 2016-01-21 MED ORDER — HYDROMORPHONE HCL 1 MG/ML IJ SOLN: 0.5000 mg | INTRAMUSCULAR | Status: DC | PRN

## 2016-01-21 MED ORDER — TRANEXAMIC ACID 1000 MG/10ML IV SOLN
1000.0000 mg | Freq: Once | INTRAVENOUS | Status: AC
  Administered 2016-01-21: 1000 mg via INTRAVENOUS
  Filled 2016-01-21: qty 10

## 2016-01-21 MED ORDER — FENTANYL CITRATE (PF) 100 MCG/2ML IJ SOLN
INTRAMUSCULAR | Status: DC | PRN
  Administered 2016-01-21: 100 ug via INTRAVENOUS

## 2016-01-21 MED ORDER — DEXTROSE 5 % IV SOLN
10.0000 mg | INTRAVENOUS | Status: DC | PRN
  Administered 2016-01-21: 35 ug/min via INTRAVENOUS

## 2016-01-21 MED ORDER — METOCLOPRAMIDE HCL 5 MG PO TABS: 5.0000 mg | ORAL_TABLET | Freq: Three times a day (TID) | ORAL | Status: DC | PRN

## 2016-01-21 MED ORDER — SODIUM BICARBONATE 650 MG PO TABS
650.0000 mg | ORAL_TABLET | Freq: Two times a day (BID) | ORAL | Status: DC
  Administered 2016-01-21 – 2016-01-22 (×2): 650 mg via ORAL
  Filled 2016-01-21 (×3): qty 1

## 2016-01-21 MED ORDER — ASPIRIN EC 325 MG PO TBEC
325.0000 mg | DELAYED_RELEASE_TABLET | Freq: Two times a day (BID) | ORAL | Status: DC
  Administered 2016-01-22: 325 mg via ORAL
  Filled 2016-01-21: qty 1

## 2016-01-21 MED ORDER — METOCLOPRAMIDE HCL 5 MG/ML IJ SOLN: 5.0000 mg | Freq: Three times a day (TID) | INTRAMUSCULAR | Status: DC | PRN

## 2016-01-21 MED ORDER — SODIUM CHLORIDE 0.9 % IV SOLN
INTRAVENOUS | Status: DC | PRN
  Administered 2016-01-21 (×2): via INTRAVENOUS

## 2016-01-21 MED ORDER — CEFAZOLIN SODIUM-DEXTROSE 2-4 GM/100ML-% IV SOLN
2.0000 g | INTRAVENOUS | Status: AC
  Administered 2016-01-21: 2 g via INTRAVENOUS

## 2016-01-21 MED ORDER — ONDANSETRON HCL 4 MG PO TABS: 4.0000 mg | ORAL_TABLET | Freq: Four times a day (QID) | ORAL | Status: DC | PRN

## 2016-01-21 MED ORDER — MENTHOL 3 MG MT LOZG: 1.0000 | LOZENGE | OROMUCOSAL | Status: DC | PRN

## 2016-01-21 MED ORDER — BISACODYL 10 MG RE SUPP: 10.0000 mg | Freq: Every day | RECTAL | Status: DC | PRN

## 2016-01-21 MED ORDER — CALCITRIOL 0.25 MCG PO CAPS
0.2500 ug | ORAL_CAPSULE | Freq: Every day | ORAL | Status: DC
  Administered 2016-01-21 – 2016-01-22 (×2): 0.25 ug via ORAL
  Filled 2016-01-21 (×2): qty 1

## 2016-01-21 MED ORDER — PROPOFOL 10 MG/ML IV BOLUS
INTRAVENOUS | Status: AC
  Filled 2016-01-21: qty 60

## 2016-01-21 MED ORDER — PROPOFOL 500 MG/50ML IV EMUL
INTRAVENOUS | Status: DC | PRN
  Administered 2016-01-21: 75 ug/kg/min via INTRAVENOUS

## 2016-01-21 MED ORDER — DEXAMETHASONE SODIUM PHOSPHATE 10 MG/ML IJ SOLN
10.0000 mg | Freq: Once | INTRAMUSCULAR | Status: AC
  Administered 2016-01-21: 10 mg via INTRAVENOUS

## 2016-01-21 MED ORDER — HYDROCODONE-ACETAMINOPHEN 7.5-325 MG PO TABS
1.0000 | ORAL_TABLET | ORAL | Status: DC
  Administered 2016-01-21 (×2): 2 via ORAL
  Administered 2016-01-21 (×2): 1 via ORAL
  Administered 2016-01-22 (×3): 2 via ORAL
  Administered 2016-01-22: 1 via ORAL
  Administered 2016-01-22: 2 via ORAL
  Filled 2016-01-21: qty 2
  Filled 2016-01-21 (×2): qty 1
  Filled 2016-01-21 (×6): qty 2

## 2016-01-21 MED ORDER — TAMSULOSIN HCL 0.4 MG PO CAPS
0.4000 mg | ORAL_CAPSULE | Freq: Every day | ORAL | Status: DC
  Administered 2016-01-21 – 2016-01-22 (×2): 0.4 mg via ORAL
  Filled 2016-01-21 (×2): qty 1

## 2016-01-21 MED ORDER — LEVOTHYROXINE SODIUM 50 MCG PO TABS
50.0000 ug | ORAL_TABLET | Freq: Every day | ORAL | Status: DC
  Administered 2016-01-22: 50 ug via ORAL
  Filled 2016-01-21: qty 1

## 2016-01-21 MED ORDER — LATANOPROST 0.005 % OP SOLN
1.0000 [drp] | Freq: Every day | OPHTHALMIC | Status: DC
  Administered 2016-01-21: 1 [drp] via OPHTHALMIC
  Filled 2016-01-21: qty 2.5

## 2016-01-21 MED ORDER — DONEPEZIL HCL 10 MG PO TABS
10.0000 mg | ORAL_TABLET | Freq: Every day | ORAL | Status: DC
  Administered 2016-01-21: 10 mg via ORAL
  Filled 2016-01-21: qty 1

## 2016-01-21 MED ORDER — VANCOMYCIN HCL IN DEXTROSE 1-5 GM/200ML-% IV SOLN
INTRAVENOUS | Status: AC
  Filled 2016-01-21: qty 200

## 2016-01-21 MED ORDER — DEXAMETHASONE SODIUM PHOSPHATE 10 MG/ML IJ SOLN
10.0000 mg | Freq: Once | INTRAMUSCULAR | Status: AC
  Administered 2016-01-22: 10 mg via INTRAVENOUS
  Filled 2016-01-21: qty 1

## 2016-01-21 MED ORDER — VANCOMYCIN HCL IN DEXTROSE 1-5 GM/200ML-% IV SOLN
1000.0000 mg | Freq: Two times a day (BID) | INTRAVENOUS | Status: AC
  Administered 2016-01-21: 1000 mg via INTRAVENOUS
  Filled 2016-01-21: qty 200

## 2016-01-21 MED ORDER — DOCUSATE SODIUM 100 MG PO CAPS
100.0000 mg | ORAL_CAPSULE | Freq: Two times a day (BID) | ORAL | Status: DC
  Administered 2016-01-21 – 2016-01-22 (×2): 100 mg via ORAL
  Filled 2016-01-21 (×2): qty 1

## 2016-01-21 MED ORDER — GABAPENTIN 300 MG PO CAPS
300.0000 mg | ORAL_CAPSULE | Freq: Two times a day (BID) | ORAL | Status: DC
  Administered 2016-01-21 – 2016-01-22 (×2): 300 mg via ORAL
  Filled 2016-01-21 (×2): qty 1

## 2016-01-21 MED ORDER — PANTOPRAZOLE SODIUM 40 MG PO TBEC
40.0000 mg | DELAYED_RELEASE_TABLET | Freq: Every day | ORAL | Status: DC
  Administered 2016-01-22: 40 mg via ORAL
  Filled 2016-01-21: qty 1

## 2016-01-21 MED ORDER — DIPHENHYDRAMINE HCL 25 MG PO CAPS: 25.0000 mg | ORAL_CAPSULE | Freq: Four times a day (QID) | ORAL | Status: DC | PRN

## 2016-01-21 MED ORDER — INSULIN GLARGINE 100 UNIT/ML ~~LOC~~ SOLN
30.0000 [IU] | Freq: Every day | SUBCUTANEOUS | Status: DC
  Administered 2016-01-21: 30 [IU] via SUBCUTANEOUS
  Filled 2016-01-21 (×2): qty 0.3

## 2016-01-21 SURGICAL SUPPLY — 37 items
BAG DECANTER FOR FLEXI CONT (MISCELLANEOUS) IMPLANT
BAG ZIPLOCK 12X15 (MISCELLANEOUS) IMPLANT
CAPT HIP TOTAL 2 ×3 IMPLANT
CLOTH BEACON ORANGE TIMEOUT ST (SAFETY) ×3 IMPLANT
COVER PERINEAL POST (MISCELLANEOUS) ×3 IMPLANT
DRAPE STERI IOBAN 125X83 (DRAPES) ×3 IMPLANT
DRAPE U-SHAPE 47X51 STRL (DRAPES) ×6 IMPLANT
DRESSING AQUACEL AG SP 3.5X10 (GAUZE/BANDAGES/DRESSINGS) ×1 IMPLANT
DRESSING AQUACEL AG SP 3.5X6 (GAUZE/BANDAGES/DRESSINGS) ×1 IMPLANT
DRSG AQUACEL AG SP 3.5X10 (GAUZE/BANDAGES/DRESSINGS) ×3
DRSG AQUACEL AG SP 3.5X6 (GAUZE/BANDAGES/DRESSINGS) ×3
DURAPREP 26ML APPLICATOR (WOUND CARE) ×3 IMPLANT
ELECT REM PT RETURN 15FT ADLT (MISCELLANEOUS) IMPLANT
ELECT REM PT RETURN 9FT ADLT (ELECTROSURGICAL) ×3
ELECTRODE REM PT RTRN 9FT ADLT (ELECTROSURGICAL) ×1 IMPLANT
GLOVE BIOGEL M STRL SZ7.5 (GLOVE) ×6 IMPLANT
GLOVE BIOGEL PI IND STRL 7.5 (GLOVE) ×1 IMPLANT
GLOVE BIOGEL PI IND STRL 8.5 (GLOVE) IMPLANT
GLOVE BIOGEL PI INDICATOR 7.5 (GLOVE) ×2
GLOVE BIOGEL PI INDICATOR 8.5 (GLOVE)
GLOVE ECLIPSE 8.0 STRL XLNG CF (GLOVE) IMPLANT
GLOVE ORTHO TXT STRL SZ7.5 (GLOVE) ×3 IMPLANT
GOWN STRL REUS W/TWL LRG LVL3 (GOWN DISPOSABLE) ×3 IMPLANT
GOWN STRL REUS W/TWL XL LVL3 (GOWN DISPOSABLE) ×3 IMPLANT
HOLDER FOLEY CATH W/STRAP (MISCELLANEOUS) ×3 IMPLANT
LIQUID BAND (GAUZE/BANDAGES/DRESSINGS) ×3 IMPLANT
PACK ANTERIOR HIP CUSTOM (KITS) ×3 IMPLANT
SAW OSC TIP CART 19.5X105X1.3 (SAW) ×3 IMPLANT
SUT MNCRL AB 4-0 PS2 18 (SUTURE) ×3 IMPLANT
SUT VIC AB 1 CT1 36 (SUTURE) ×9 IMPLANT
SUT VIC AB 2-0 CT1 27 (SUTURE) ×4
SUT VIC AB 2-0 CT1 TAPERPNT 27 (SUTURE) ×2 IMPLANT
SUT VLOC 180 0 24IN GS25 (SUTURE) ×3 IMPLANT
TRAY FOLEY W/METER SILVER 14FR (SET/KITS/TRAYS/PACK) IMPLANT
TRAY FOLEY W/METER SILVER 16FR (SET/KITS/TRAYS/PACK) ×3 IMPLANT
WATER STERILE IRR 1500ML POUR (IV SOLUTION) ×3 IMPLANT
YANKAUER SUCT BULB TIP 10FT TU (MISCELLANEOUS) ×3 IMPLANT

## 2016-01-21 NOTE — Anesthesia Procedure Notes (Signed)
Spinal Patient location during procedure: OR End time: 01/21/2016 9:01 AM Staffing Anesthesiologist: Franne Grip Performed by: anesthesiologist  Preanesthetic Checklist Completed: patient identified, site marked, surgical consent, pre-op evaluation, timeout performed, IV checked, risks and benefits discussed and monitors and equipment checked Spinal Block Patient position: sitting Prep: Betadine Patient monitoring: heart rate, continuous pulse ox and blood pressure Approach: left paramedian Location: L3-4 Injection technique: single-shot Needle Needle type: Spinocan  Needle gauge: 22 G Needle length: 9 cm Assessment Sensory level: T4 Additional Notes Expiration date of kit checked and confirmed. Patient tolerated procedure well, without complications.

## 2016-01-21 NOTE — Evaluation (Signed)
Physical Therapy Evaluation Patient Details Name: Patrick Boyle MRN: [id number] DOB: [birth year] Today's Date: 01/21/2016   History of Present Illness  82 yo male s/p L THA-direct anterior 01/21/16.   Clinical Impression  On eval, pt required Min assist for mobility-walked ~40 feet with RW. Pain rated 8/10 with activity. Pt became nauseous towards end of walk. Will follow and progress activity as tolerated.     Follow Up Recommendations Home health PT;Supervision/Assistance - 24 hour    Equipment Recommendations  None recommended by PT    Recommendations for Other Services       Precautions / Restrictions Precautions Precautions: Fall Restrictions Weight Bearing Restrictions: No LLE Weight Bearing: Weight bearing as tolerated      Mobility  Bed Mobility Overal bed mobility: Needs Assistance Bed Mobility: Supine to Sit     Supine to sit: Min assist;HOB elevated     General bed mobility comments: Assist for L LE and to scoot to EOB. Increased time.   Transfers Overall transfer level: Needs assistance Equipment used: Rolling walker (2 wheeled) Transfers: Sit to/from Stand Sit to Stand: Min assist;From elevated surface         General transfer comment: VCs safety, hand placement. Assist to rise, stabilize, control descent.   Ambulation/Gait Ambulation/Gait assistance: Min assist Ambulation Distance (Feet): 40 Feet Assistive device: Rolling walker (2 wheeled) Gait Pattern/deviations: Step-to pattern;Step-through pattern;Decreased stride length     General Gait Details: assist to stabilize. VCs safety, sequence. slow gait speed. followed with recliner  Stairs            Wheelchair Mobility    Modified Rankin (Stroke Patients Only)       Balance                                             Pertinent Vitals/Pain Pain Assessment: 0-10 Pain Score: 8  Pain Location: L hip Pain Descriptors / Indicators: Aching;Sore Pain  Intervention(s): Monitored during session    Home Living Family/patient expects to be discharged to:: Private residence Living Arrangements: Spouse/significant other   Type of Home: House Home Access: Stairs to enter   Technical brewer of Steps: 2 Home Layout: One level Home Equipment: Environmental consultant - 2 wheels      Prior Function Level of Independence: Independent with assistive device(s)         Comments: uses cane sometimes     Hand Dominance        Extremity/Trunk Assessment   Upper Extremity Assessment: Defer to OT evaluation           Lower Extremity Assessment: LLE deficits/detail   LLE Deficits / Details: moves ankle well.   Cervical / Trunk Assessment: Normal  Communication   Communication: HOH  Cognition Arousal/Alertness: Awake/alert Behavior During Therapy: WFL for tasks assessed/performed Overall Cognitive Status: Within Functional Limits for tasks assessed                      General Comments      Exercises        Assessment/Plan    PT Assessment Patient needs continued PT services  PT Diagnosis Difficulty walking;Acute pain   PT Problem List Decreased strength;Decreased range of motion;Decreased activity tolerance;Decreased balance;Decreased mobility;Decreased knowledge of use of DME;Pain  PT Treatment Interventions DME instruction;Gait training;Stair training;Functional mobility training;Therapeutic activities;Patient/family education;Balance training;Therapeutic exercise   PT Goals (Current  goals can be found in the Care Plan section) Acute Rehab PT Goals Patient Stated Goal: none stated PT Goal Formulation: With patient Time For Goal Achievement: 01/28/16 Potential to Achieve Goals: Good    Frequency 7X/week   Barriers to discharge        Co-evaluation               End of Session   Activity Tolerance: Other (comment) (Pt became nauseous towards end of walk) Patient left: in chair;with chair alarm set;with  call bell/phone within reach           Time: [id number] PT Time Calculation (min) (ACUTE ONLY): 13 min   Charges:   PT Evaluation $PT Eval Low Complexity: 1 Procedure     PT G Codes:        Weston Anna, MPT Pager: [phone]

## 2016-01-21 NOTE — Anesthesia Postprocedure Evaluation (Signed)
Anesthesia Post Note  Patient: Patrick Boyle  Procedure(s) Performed: Procedure(s) (LRB): LEFT TOTAL HIP ARTHROPLASTY ANTERIOR APPROACH (Left)  Patient location during evaluation: PACU Anesthesia Type: Spinal Level of consciousness: oriented and awake and alert Pain management: pain level controlled Vital Signs Assessment: post-procedure vital signs reviewed and stable Respiratory status: spontaneous breathing, respiratory function stable and patient connected to nasal cannula oxygen Cardiovascular status: blood pressure returned to baseline and stable Postop Assessment: no headache, no backache, spinal receding and patient able to bend at knees Anesthetic complications: no    Last Vitals:  Filed Vitals:   01/21/16 1130 01/21/16 1145  BP: 132/63 126/57  Pulse: 66 65  Temp:  36.3 C  Resp: 17     Last Pain:  Filed Vitals:   01/21/16 1149  PainSc: 5                  Reco Shonk J

## 2016-01-21 NOTE — Interval H&P Note (Signed)
History and Physical Interval Note:  01/21/2016 7:10 AM  Patrick Boyle  has presented today for surgery, with the diagnosis of LEFT HIP OA  The various methods of treatment have been discussed with the patient and family. After consideration of risks, benefits and other options for treatment, the patient has consented to  Procedure(s): LEFT TOTAL HIP ARTHROPLASTY ANTERIOR APPROACH (Left) as a surgical intervention .  The patient's history has been reviewed, patient examined, no change in status, stable for surgery.  I have reviewed the patient's chart and labs.  Questions were answered to the patient's satisfaction.     Mauri Pole

## 2016-01-21 NOTE — Progress Notes (Signed)
Utilization review completed.  

## 2016-01-21 NOTE — Anesthesia Preprocedure Evaluation (Addendum)
Anesthesia Evaluation  Patient identified by MRN, date of birth, ID band Patient awake  General Assessment Comment:Past Medical History Diagnosis Date . Hyperlipidemia  . Hypertension  . Coronary artery disease    s/p CABG 1997 . GERD (gastroesophageal reflux disease)  . Arthritis  . Allergic rhinitis  . Hyperplastic colon polyp  . Diverticulitis, colon  . Meniere's syndrome  . Tremor, essential    dr Jannifer Franklin- bengin  . COPD, mild ([place])    dr clance . Ischemic cardiomyopathy    sp ICD (SJM) implanted by Dr Rayann Heman . MI (myocardial infarction) ([place]) 1997, 2005 . AICD (automatic cardioverter/defibrillator) present  . Presence of permanent cardiac pacemaker  . CHF (congestive heart failure) ([place])  . Diabetes mellitus    dr Olen Pel- Type II . Hypothyroidism  . Dementia  . Chronic kidney disease  . Self-catheterizes urinary bladder    bid-  . History of blood transfusion  . Anemia  . HOH (hard of hearing)        Reviewed: Allergy & Precautions, NPO status , Patient's Chart, lab work & pertinent test results  Airway Mallampati: II  TM Distance: >3 FB Neck ROM: Full    Dental no notable dental hx.    Pulmonary COPD, former smoker,    Pulmonary exam normal breath sounds clear to auscultation       Cardiovascular hypertension, + CAD, + Past MI and +CHF  Normal cardiovascular exam+ pacemaker + Cardiac Defibrillator  Rhythm:Regular Rate:Normal  ECHO: 2011: EF 25-30%   Neuro/Psych PSYCHIATRIC DISORDERS negative neurological ROS     GI/Hepatic Neg liver ROS, GERD  ,  Endo/Other  diabetes, Type 2, Oral Hypoglycemic Agents, Insulin DependentHypothyroidism   Renal/GU Renal disease  negative genitourinary   Musculoskeletal  (+) Arthritis ,   Abdominal   Peds negative pediatric ROS (+)  Hematology  (+) anemia ,    Anesthesia Other Findings   Reproductive/Obstetrics negative OB ROS                          Anesthesia Physical Anesthesia Plan  ASA: III  Anesthesia Plan: Spinal   Post-op Pain Management:    Induction: Intravenous  Airway Management Planned:   Additional Equipment:   Intra-op Plan:   Post-operative Plan: Extubation in OR  Informed Consent: I have reviewed the patients History and Physical, chart, labs and discussed the procedure including the risks, benefits and alternatives for the proposed anesthesia with the patient or authorized representative who has indicated his/her understanding and acceptance.   Dental advisory given  Plan Discussed with: CRNA  Anesthesia Plan Comments: (Discussed risks and benefits of and differences between spinal and general. Discussed risks of spinal including headache, backache, failure, bleeding and hematoma, infection, and nerve damage. Patient consents to spinal. Questions answered. Coagulation studies and platelet count acceptable.  H/O Thrombocytopenia, but most recent platelet count is normal. Most recent coag tests were normal in April. He denies any history of excess bleeding in the past.   I believe the benefits of spinal outweigh risks in this patient and he consents.)      Anesthesia Quick Evaluation

## 2016-01-21 NOTE — Transfer of Care (Signed)
Immediate Anesthesia Transfer of Care Note  Patient: Patrick Boyle  Procedure(s) Performed: Procedure(s): LEFT TOTAL HIP ARTHROPLASTY ANTERIOR APPROACH (Left)  Patient Location: PACU  Anesthesia Type:Spinal  Level of Consciousness: awake  Airway & Oxygen Therapy: Patient Spontanous Breathing and Patient connected to face mask oxygen  Post-op Assessment: Report given to RN and Post -op Vital signs reviewed and stable  Post vital signs: Reviewed and stable  Last Vitals:  Filed Vitals:   01/21/16 0633  BP: 135/57  Pulse: 70  Temp: 36.7 C  Resp: 16    Last Pain: There were no vitals filed for this visit.    Patients Stated Pain Goal: 3 ([id number])  Complications: No apparent anesthesia complications

## 2016-01-22 DIAGNOSIS — E663 Overweight: Secondary | ICD-10-CM | POA: Diagnosis present

## 2016-01-22 LAB — BASIC METABOLIC PANEL
Anion gap: 6 (ref 5–15)
Anion gap: 9 (ref 5–15)
BUN: 42 mg/dL — AB (ref 6–20)
BUN: 43 mg/dL — AB (ref 6–20)
CALCIUM: 8.3 mg/dL — AB (ref 8.9–10.3)
CO2: 20 mmol/L — ABNORMAL LOW (ref 22–32)
CO2: 18 mmol/L — AB (ref 22–32)
CREATININE: 2.51 mg/dL — AB (ref 0.61–1.24)
Calcium: 8.2 mg/dL — ABNORMAL LOW (ref 8.9–10.3)
Chloride: 108 mmol/L (ref 101–111)
Chloride: 107 mmol/L (ref 101–111)
Creatinine, Ser: 2.5 mg/dL — ABNORMAL HIGH (ref 0.61–1.24)
GFR calc Af Amer: 26 mL/min — ABNORMAL LOW (ref >60)
GFR calc Af Amer: 27 mL/min — ABNORMAL LOW (ref >60)
GFR, EST NON AFRICAN AMERICAN: 23 mL/min — AB (ref >60)
GFR, EST NON AFRICAN AMERICAN: 23 mL/min — AB (ref >60)
GLUCOSE: 254 mg/dL — AB (ref 65–99)
GLUCOSE: 232 mg/dL — AB (ref 65–99)
Potassium: 6.3 mmol/L (ref 3.5–5.1)
Potassium: 5.5 mmol/L — ABNORMAL HIGH (ref 3.5–5.1)
SODIUM: 134 mmol/L — AB (ref 135–145)
Sodium: 134 mmol/L — ABNORMAL LOW (ref 135–145)

## 2016-01-22 LAB — CBC
HCT: 29.3 % — ABNORMAL LOW (ref 39.0–52.0)
Hemoglobin: 10 g/dL — ABNORMAL LOW (ref 13.0–17.0)
MCH: 31.7 pg (ref 26.0–34.0)
MCHC: 34.1 g/dL (ref 30.0–36.0)
MCV: 93 fL (ref 78.0–100.0)
PLATELETS: 123 10*3/uL — AB (ref 150–400)
RBC: 3.15 MIL/uL — AB (ref 4.22–5.81)
RDW: 13.8 % (ref 11.5–15.5)
WBC: 10.7 10*3/uL — ABNORMAL HIGH (ref 4.0–10.5)

## 2016-01-22 LAB — GLUCOSE, CAPILLARY
GLUCOSE-CAPILLARY: 189 mg/dL — AB (ref 65–99)
Glucose-Capillary: 239 mg/dL — ABNORMAL HIGH (ref 65–99)

## 2016-01-22 MED ORDER — POLYETHYLENE GLYCOL 3350 17 G PO PACK: 17.0000 g | PACK | Freq: Two times a day (BID) | ORAL | Status: DC

## 2016-01-22 MED ORDER — ASPIRIN 325 MG PO TBEC: 325.0000 mg | DELAYED_RELEASE_TABLET | Freq: Two times a day (BID) | ORAL | Status: AC

## 2016-01-22 MED ORDER — HYDROCODONE-ACETAMINOPHEN 7.5-325 MG PO TABS: 1.0000 | ORAL_TABLET | ORAL | Status: DC | PRN

## 2016-01-22 MED ORDER — DOCUSATE SODIUM 100 MG PO CAPS: 100.0000 mg | ORAL_CAPSULE | Freq: Two times a day (BID) | ORAL | Status: DC

## 2016-01-22 MED ORDER — FERROUS SULFATE 325 (65 FE) MG PO TABS: 325.0000 mg | ORAL_TABLET | Freq: Three times a day (TID) | ORAL | Status: DC

## 2016-01-22 MED ORDER — TIZANIDINE HCL 4 MG PO TABS: 4.0000 mg | ORAL_TABLET | Freq: Four times a day (QID) | ORAL | Status: DC | PRN

## 2016-01-22 NOTE — Progress Notes (Signed)
01/22/16 0625 Nursing Brad Dixon PA called reg K level 6.3 this am. No orders received.

## 2016-01-22 NOTE — Progress Notes (Signed)
Physical Therapy Treatment Patient Details Name: WYLLIAM HANEK MRN: [id number] DOB: [birth year] Today's Date: 01/22/2016    History of Present Illness 82 yo male s/p L THA-direct anterior 01/21/16.     PT Comments    Progressing with mobility. Will plan to have a 2nd session later today.   Follow Up Recommendations  Home health PT;Supervision/Assistance - 24 hour     Equipment Recommendations  None recommended by PT    Recommendations for Other Services       Precautions / Restrictions Precautions Precautions: Fall Restrictions Weight Bearing Restrictions: No LLE Weight Bearing: Weight bearing as tolerated    Mobility  Bed Mobility Overal bed mobility: Needs Assistance Bed Mobility: Supine to Sit     Supine to sit: Min guard;HOB elevated     General bed mobility comments: close guard for safety. Increased time.   Transfers Overall transfer level: Needs assistance Equipment used: Rolling walker (2 wheeled) Transfers: Sit to/from Stand Sit to Stand: Min assist         General transfer comment: VCs safety, hand placement. Assist to rise, stabilize, control descent.   Ambulation/Gait Ambulation/Gait assistance: Min guard Ambulation Distance (Feet): 65 Feet Assistive device: Rolling walker (2 wheeled) Gait Pattern/deviations: Step-through pattern;Decreased stride length     General Gait Details: assist to stabilize. VCs safety, sequence. slow gait speed.   Stairs            Wheelchair Mobility    Modified Rankin (Stroke Patients Only)       Balance                                    Cognition Arousal/Alertness: Awake/alert Behavior During Therapy: WFL for tasks assessed/performed Overall Cognitive Status: Within Functional Limits for tasks assessed                      Exercises      General Comments        Pertinent Vitals/Pain Pain Assessment: 0-10 Pain Score: 6  Pain Location: L hip Pain Descriptors /  Indicators: Aching;Sore Pain Intervention(s): Monitored during session;Repositioned    Home Living                      Prior Function            PT Goals (current goals can now be found in the care plan section) Progress towards PT goals: Progressing toward goals    Frequency  7X/week    PT Plan Current plan remains appropriate    Co-evaluation             End of Session Equipment Utilized During Treatment: Gait belt Activity Tolerance: Patient tolerated treatment well Patient left: in chair;with call bell/phone within reach;with chair alarm set     Time: 1045-1101 PT Time Calculation (min) (ACUTE ONLY): 16 min  Charges:  $Gait Training: 8-22 mins                    G Codes:      Weston Anna, MPT Pager: [phone]

## 2016-01-22 NOTE — Evaluation (Addendum)
Occupational Therapy Evaluation Patient Details Name: Patrick Boyle MRN: [id number] DOB: 23-Nov-1933 Today's Date: 01/22/2016    History of Present Illness 82 yo male s/p L THA-direct anterior 01/21/16.    Clinical Impression   Pt was admitted for the above surgery. All education was completed,and he and family verbalize understanding.   Pt plans discharge home today and will be followed by PT.      Follow Up Recommendations  Supervision/Assistance - 24 hour    Equipment Recommendations  None recommended by OT; pt has a comfort height commode and wants to try this. He will let HHPT know if he needs further DME.      Recommendations for Other Services       Precautions / Restrictions Precautions Precautions: Fall Restrictions Weight Bearing Restrictions: No LLE Weight Bearing: Weight bearing as tolerated      Mobility Bed Mobility Overal bed mobility: Needs Assistance Bed Mobility: Supine to Sit     Supine to sit: Min guard;HOB elevated     General bed mobility comments: oob  Transfers Overall transfer level: Needs assistance Equipment used: Rolling walker (2 wheeled) Transfers: Sit to/from Stand Sit to Stand: Min assist         General transfer comment: steadying assistance; pt initially had imbalance when standing but self corrected.  Cues for UE/LE placement    Balance                                            ADL Overall ADL's : Needs assistance/impaired     Grooming: Set up;Sitting   Upper Body Bathing: Set up;Sitting   Lower Body Bathing: Minimal assistance;Sit to/from stand;With adaptive equipment   Upper Body Dressing : Set up;Sitting   Lower Body Dressing: Moderate assistance;With adaptive equipment;Sit to/from stand Management consultant)   Toilet Transfer: Minimal assistance;Ambulation;Comfort height toilet;RW   Toileting- Water quality scientist and Hygiene: Min guard;Sit to/from stand         General ADL Comments: Pt has a  reacher and long sponge at home.  Light min A for sit to stand.  Pt unsteady but no LOB.  Educated on shower sequence, but pt did not want to practice as he did not want to get worn out before PT.  Daughter and wife in room.       Vision     Perception     Praxis      Pertinent Vitals/Pain Pain Assessment: Faces Pain Score: 6  Faces Pain Scale: Hurts little more Pain Location: L hip Pain Descriptors / Indicators: Aching Pain Intervention(s): Limited activity within patient's tolerance;Monitored during session;Premedicated before session;Repositioned;Ice applied     Hand Dominance     Extremity/Trunk Assessment Upper Extremity Assessment Upper Extremity Assessment: Overall WFL for tasks assessed (tremor noted in R hand at times)           Communication Communication Communication: HOH   Cognition Arousal/Alertness: Awake/alert Behavior During Therapy: WFL for tasks assessed/performed Overall Cognitive Status: Within Functional Limits for tasks assessed                     General Comments       Exercises       Shoulder Instructions      Home Living Family/patient expects to be discharged to:: Private residence Living Arrangements: Spouse/significant other Available Help at Discharge: Family  Bathroom Shower/Tub: Occupational psychologist: Handicapped height     Home Equipment: Environmental consultant - 2 wheels   Additional Comments: pt has comfort height commodes; grab bar next to one and sink next to the other.  Has a built in shower seat      Prior Functioning/Environment Level of Independence: Independent with assistive device(s)             OT Diagnosis: Generalized weakness;Acute pain   OT Problem List:     OT Treatment/Interventions:      OT Goals(Current goals can be found in the care plan section) Acute Rehab OT Goals Patient Stated Goal: be able to urinate OT Goal Formulation: All assessment and education complete,  DC therapy  OT Frequency:     Barriers to D/C:            Co-evaluation              End of Session    Activity Tolerance: Patient tolerated treatment well Patient left: in chair;with call bell/phone within reach;with chair alarm set;with family/visitor present   Time: [id number] OT Time Calculation (min): 28 min Charges:  OT General Charges $OT Visit: 1 Procedure OT Evaluation $OT Eval Low Complexity: 1 Procedure OT Treatments $Self Care/Home Management : 8-22 mins G-Codes:    Daeshaun Specht January 30, 2016, 2:47 PM  Lesle Chris, OTR/L [phone] 01-30-16

## 2016-01-22 NOTE — Progress Notes (Addendum)
Physical Therapy Treatment Patient Details Name: Patrick Boyle MRN: [id number] DOB: June 24, 1934 Today's Date: 01/22/2016    History of Present Illness 82 yo male s/p L THA-direct anterior 01/21/16.     PT Comments    Practiced gait training and stair training this session. Family present to observe. Pt tolerated session well. All education completed. Discussed d/c plan with pt and family. Highly recommend HHPT follow up instead of OP PT at this time-made CM Judson Roch) aware. Family prefers HHPT as well.   Follow Up Recommendations  Home health PT;Supervision/Assistance - 24 hour     Equipment Recommendations  None recommended by PT    Recommendations for Other Services       Precautions / Restrictions Precautions Precautions: Fall Restrictions Weight Bearing Restrictions: No LLE Weight Bearing: Weight bearing as tolerated    Mobility  Bed Mobility Overal bed mobility: Needs Assistance Bed Mobility: Supine to Sit     Supine to sit: Min guard;HOB elevated     General bed mobility comments: oob  Transfers Overall transfer level: Needs assistance Equipment used: Rolling walker (2 wheeled) Transfers: Sit to/from Stand Sit to Stand: Min assist         General transfer comment: assist to steady. VCs safety, hand placement.   Ambulation/Gait Ambulation/Gait assistance: Min guard Ambulation Distance (Feet): 100 Feet Assistive device: Rolling walker (2 wheeled) Gait Pattern/deviations: Step-to pattern;Step-through pattern     General Gait Details: assist to stabilize. VCs safety, sequence, L foot positioning. slow gait speed.   Stairs Stairs: Yes Stairs assistance: Min assist Stair Management: Forwards;Step to pattern;One rail Left Number of Stairs: 5 General stair comments: up and over portable steps x 2. VCs safety, technique, sequence. Assist to stabilize. Family present to observe as well.   Wheelchair Mobility    Modified Rankin (Stroke Patients Only)        Balance                                    Cognition Arousal/Alertness: Awake/alert Behavior During Therapy: WFL for tasks assessed/performed Overall Cognitive Status: Within Functional Limits for tasks assessed                      Exercises      General Comments        Pertinent Vitals/Pain Pain Assessment: 0-10 Pain Score: 5  Faces Pain Scale: Hurts little more Pain Location: L hip Pain Descriptors / Indicators: Aching;Sore Pain Intervention(s): Monitored during session;Repositioned    Home Living Family/patient expects to be discharged to:: Private residence Living Arrangements: Spouse/significant other Available Help at Discharge: Family         Home Equipment: Gilford Rile - 2 wheels Additional Comments: pt has comfort height commodes; grab bar next to one and sink next to the other.  Has a built in shower seat    Prior Function Level of Independence: Independent with assistive device(s)          PT Goals (current goals can now be found in the care plan section) Acute Rehab PT Goals Patient Stated Goal: be able to urinate Progress towards PT goals: Progressing toward goals    Frequency  7X/week    PT Plan Current plan remains appropriate    Co-evaluation             End of Session Equipment Utilized During Treatment: Gait belt Activity Tolerance: Patient tolerated treatment well Patient  left: in chair;with call bell/phone within reach;with chair alarm set;with family/visitor present     Time: 1430-1457 PT Time Calculation (min) (ACUTE ONLY): 27 min  Charges:  $Gait Training: 23-37 mins                    G Codes:      Weston Anna, MPT Pager: [phone]

## 2016-01-22 NOTE — Progress Notes (Signed)
Discussed with Adrian Prince, PA about situation where pt is unable to void. Agreed upon bladder scan and in and out cath if greater than 250. Bladder scanned pt and found >246ml in bladder. In and out cath patient for 519ml. Matt agreed that it is okay for patient to be discharged with out patient therapy. Pt discussed that he in and out caths himself twice a day at home and is normal for him. Matt, PA aware and is okay with patient to be discharged. All findings have been discussed with patient and family who feel comfortable going home and to follow up for out patient therapy.

## 2016-01-22 NOTE — Progress Notes (Signed)
     Subjective: 1 Day Post-Op Procedure(s) (LRB): LEFT TOTAL HIP ARTHROPLASTY ANTERIOR APPROACH (Left)   Patient reports pain as mild, pain controlled.  No events throughout the night.  Worked well with PT.  Ready to go home if he continues to do well with PT.  Objective:   VITALS:   Filed Vitals:   01/22/16 0130 01/22/16 0500  BP: 134/66 134/60  Pulse: 79 76  Temp: 97.7 F (36.5 C) 97.6 F (36.4 C)  Resp: 16 18    Dorsiflexion/Plantar flexion intact Incision: dressing C/D/I No cellulitis present Compartment soft  LABS  Recent Labs  01/21/16 0808 01/22/16 0411  HGB 10.9* 10.0*  HCT 32.0* 29.3*  WBC  --  10.7*  PLT  --  123*     Recent Labs  01/21/16 0808 01/22/16 0411 01/22/16 0822  NA 142 134* 134*  K 4.7 6.3* 5.5*  BUN  --  42* 43*  CREATININE  --  2.51* 2.50*  GLUCOSE 105* 254* 232*     Assessment/Plan: 1 Day Post-Op Procedure(s) (LRB): LEFT TOTAL HIP ARTHROPLASTY ANTERIOR APPROACH (Left) Foley cath d/c'ed Advance diet Up with therapy D/C IV fluids Discharge home with home health, if he does well with PT  Follow up in 2 weeks at [facility]. Follow up with OLIN,Verdene Creson D in 2 weeks.  Contact information:  [facility] [address] [place] [id number]    Overweight (BMI 25-29.9) Estimated body mass index is 26.23 kg/(m^2) as calculated from the following:   Height as of this encounter: 5\' 11"  (1.803 m).   Weight as of this encounter: 85.276 kg (188 lb). Patient also counseled that weight may inhibit the healing process Patient counseled that losing weight will help with future health issues        West Pugh. Alleigh Mollica   PAC  01/22/2016, 9:54 AM

## 2016-01-22 NOTE — Discharge Instructions (Signed)

## 2016-01-22 NOTE — Progress Notes (Signed)
OT Cancellation Note  Patient Details Name: Patrick Boyle MRN: [id number] DOB: [birth year]   Cancelled Treatment:    Reason Eval/Treat Not Completed: Other (comment) .  Pt's room has water on floor and it is not safe to see him at this time (facilities has been called). Will check back.  Avontae Burkhead 01/22/2016, 1:14 PM  Lesle Chris, OTR/L [phone] 01/22/2016

## 2016-01-22 NOTE — Op Note (Signed)
NAME:  ASAR ESKRA NO.: [id number]      MEDICAL RECORD NO.: [id number]      FACILITY:  [facility]      PHYSICIAN:  Paralee Cancel D  DATE OF BIRTH:  [birth year]     DATE OF PROCEDURE:  01/22/2016                                 OPERATIVE REPORT         PREOPERATIVE DIAGNOSIS: Left  hip osteoarthritis.      POSTOPERATIVE DIAGNOSIS:  Left hip osteoarthritis.      PROCEDURE:  Left total hip replacement through an anterior approach   utilizing DePuy THR system, component size 69mm pinnacle cup, a size 36+4 neutral   Altrex liner, a size 9 Hi Tri Lock stem with a 36+1.5 delta ceramic   ball.      SURGEON:  Pietro Cassis. Alvan Dame, M.D.      ASSISTANT:  Nehemiah Massed, PA-C     ANESTHESIA:  Spinal.      SPECIMENS:  None.      COMPLICATIONS:  None.      BLOOD LOSS:  400 cc     DRAINS:  None.      INDICATION OF THE PROCEDURE:  Patrick Boyle is a 82 y.o. male who had   presented to office for evaluation of left hip pain.  Radiographs revealed   progressive degenerative changes with bone-on-bone   articulation to the  hip joint.  The patient had painful limited range of   motion significantly affecting their overall quality of life.  The patient was failing to    respond to conservative measures, and at this point was ready   to proceed with more definitive measures.  The patient has noted progressive   degenerative changes in his hip, progressive problems and dysfunction   with regarding the hip prior to surgery.  Consent was obtained for   benefit of pain relief.  Specific risk of infection, DVT, component   failure, dislocation, need for revision surgery, as well discussion of   the anterior versus posterior approach were reviewed.  Consent was   obtained for benefit of anterior pain relief through an anterior   approach.      PROCEDURE IN DETAIL:  The patient was brought to operative theater.   Once adequate anesthesia, preoperative  antibiotics, 2gm of Ancef, 1 gm of Vancomycin, 1 gm of Tranexamic Acid, and 10 mg of Decadron administered.   The patient was positioned supine on the OSI Hanna table.  Once adequate   padding of boney process was carried out, we had predraped out the hip, and  used fluoroscopy to confirm orientation of the pelvis and position.      The left hip was then prepped and draped from proximal iliac crest to   mid thigh with shower curtain technique.      Time-out was performed identifying the patient, planned procedure, and   extremity.     An incision was then made 2 cm distal and lateral to the   anterior superior iliac spine extending over the orientation of the   tensor fascia lata muscle and sharp dissection was carried down to the   fascia of the muscle and protractor placed in the soft tissues.  The fascia was then incised.  The muscle belly was identified and swept   laterally and retractor placed along the superior neck.  Following   cauterization of the circumflex vessels and removing some pericapsular   fat, a second cobra retractor was placed on the inferior neck.  A third   retractor was placed on the anterior acetabulum after elevating the   anterior rectus.  A L-capsulotomy was along the line of the   superior neck to the trochanteric fossa, then extended proximally and   distally.  Tag sutures were placed and the retractors were then placed   intracapsular.  We then identified the trochanteric fossa and   orientation of my neck cut, confirmed this radiographically   and then made a neck osteotomy with the femur on traction.  The femoral   head was removed without difficulty or complication.  Traction was let   off and retractors were placed posterior and anterior around the   acetabulum.      The labrum and foveal tissue were debrided.  I began reaming with a 38mm   reamer and reamed up to 36mm reamer with good bony bed preparation and a 45mm   cup was chosen.  The  final 40mm Pinnacle cup was then impacted under fluoroscopy  to confirm the depth of penetration and orientation with respect to   abduction.  A screw was placed followed by the hole eliminator.  The final   36+4 neutral Altrex liner was impacted with good visualized rim fit.  The cup was positioned anatomically within the acetabular portion of the pelvis.      At this point, the femur was rolled at 80 degrees.  Further capsule was   released off the inferior aspect of the femoral neck.  I then   released the superior capsule proximally.  The hook was placed laterally   along the femur and elevated manually and held in position with the bed   hook.  The leg was then extended and adducted with the leg rolled to 100   degrees of external rotation.  Once the proximal femur was fully   exposed, I used a box osteotome to set orientation.  I then began   broaching with the starting chili pepper broach and passed this by hand and then broached up to 9.  With the 9 broach in place I chose a high offset neck and did a trial reduction.  The offset was appropriate, leg lengths   appeared to be equal, confirmed radiographically.   Given these findings, I went ahead and dislocated the hip, repositioned all   retractors and positioned the right hip in the extended and abducted position.  The final 9 Hi Tri Lock stem was   chosen and it was impacted down to the level of neck cut.  Based on this   and the trial reduction, a 36+1.5 delta ceramic ball was chosen and   impacted onto a clean and dry trunnion, and the hip was reduced.  The   hip had been irrigated throughout the case again at this point.  I did   reapproximate the superior capsular leaflet to the anterior leaflet   using #1 Vicryl.  The fascia of the   tensor fascia lata muscle was then reapproximated using #1 Vicryl and #0 V-lock sutures.  The   remaining wound was closed with 2-0 Vicryl and running 4-0 Monocryl.   The hip was cleaned, dried,  and dressed sterilely using Dermabond  and   Aquacel dressing.  He was then brought   to recovery room in stable condition tolerating the procedure well.    Nehemiah Massed, PA-C was present for the entirety of the case involved from   preoperative positioning, perioperative retractor management, general   facilitation of the case, as well as primary wound closure as assistant.            Pietro Cassis Alvan Dame, M.D.        01/22/2016 7:25 AM

## 2016-01-22 NOTE — Care Management Note (Signed)
Case Management Note  Patient Details  Name: Patrick Boyle MRN: [id number] Date of Birth: June 08, 1934  Subjective/Objective:                  LEFT TOTAL HIP ARTHROPLASTY ANTERIOR APPROACH (Left)  Action/Plan: Discharge planning Expected Discharge Date:                  Expected Discharge Plan:  Home/Self Care  In-House Referral:     Discharge planning Services  CM Consult  Post Acute Care Choice:    Choice offered to:  Patient, Spouse, Adult Children  DME Arranged:    DME Agency:  NA  HH Arranged:  NA HH Agency:     Status of Service:  In process, will continue to follow  Medicare Important Message Given:    Date Medicare IM Given:    Medicare IM give by:    Date Additional Medicare IM Given:    Additional Medicare Important Message give by:     If discussed at [place] of Stay Meetings, dates discussed:    Additional Comments: Cm met with pt to confirm plan is for outpt PT; pt and family confirm but voice concerns about goig home without [place] services.  CM will revisit 5/25 to see if plan for outpt PT has changed.   Dellie Catholic, RN 01/22/2016, 3:10 PM

## 2016-01-28 ENCOUNTER — Other Ambulatory Visit: Payer: Self-pay | Admitting: *Deleted

## 2016-01-28 NOTE — Patient Outreach (Signed)
Transition of care call #1 after scheduled hip replacement surgery. Mrs. Patrick Boyle reports all went well. Mr. Patrick Boyle is fairly comfortable at home. Their daughter is also there to help out which is wonderful. Mrs. Patrick Boyle knows me from our previous Engagement and knows to call me with any questions or problems. I will follow up in one week.  Deloria Lair [facility] [place] [phone]

## 2016-01-28 NOTE — Discharge Summary (Signed)
Physician Discharge Summary  Patient ID: IDUS STOESSEL MRN: [id number] DOB/AGE: 1934-06-10 82 y.o.  Admit date: 01/21/2016 Discharge date: 01/22/2016   Procedures:  Procedure(s) (LRB): LEFT TOTAL HIP ARTHROPLASTY ANTERIOR APPROACH (Left)  Attending Physician:  Dr. Paralee Cancel   Admission Diagnoses:   Left hip primary OA / pain  Discharge Diagnoses:  Principal Problem:   S/P left THA, AA Active Problems:   Overweight (BMI 25.0-29.9)  Past Medical History  Diagnosis Date  . Hyperlipidemia   . Hypertension   . Coronary artery disease     s/p CABG 1997  . GERD (gastroesophageal reflux disease)   . Arthritis   . Allergic rhinitis   . Hyperplastic colon polyp   . Diverticulitis, colon   . Meniere's syndrome   . Tremor, essential     dr Jannifer Franklin- bengin   . COPD, mild ([place])     dr clance  . Ischemic cardiomyopathy     sp ICD (SJM) implanted by Dr Rayann Heman  . MI (myocardial infarction) ([place]) 1997, 2005  . AICD (automatic cardioverter/defibrillator) present   . Presence of permanent cardiac pacemaker   . CHF (congestive heart failure) ([place])   . Diabetes mellitus     dr Olen Pel- Type II  . Hypothyroidism   . Dementia   . Chronic kidney disease   . Self-catheterizes urinary bladder     bid-   . History of blood transfusion   . Anemia   . HOH (hard of hearing)     HPI:    Patrick Boyle, 82 y.o. male, has a history of pain and functional disability in the left hip(s) due to arthritis and patient has failed non-surgical conservative treatments for greater than 12 weeks to include NSAID's and/or analgesics and activity modification. Onset of symptoms was gradual starting 1+ years ago with gradually worsening course since that time.The patient noted prior procedures of the hip to include arthroplasty on the right hip in [place] in 2004. Patient currently rates pain in the left hip at 8 out of 10 with activity. Patient has worsening of pain with activity and weight bearing,  trendelenberg gait, pain that interfers with activities of daily living and pain with passive range of motion. Patient has evidence of periarticular osteophytes and joint space narrowing by imaging studies. This condition presents safety issues increasing the risk of falls. There is no current active infection. Risks, benefits and expectations were discussed with the patient. Risks including but not limited to the risk of anesthesia, blood clots, nerve damage, blood vessel damage, failure of the prosthesis, infection and up to and including death. Patient understand the risks, benefits and expectations and wishes to proceed with surgery.   PCP: Orpah Melter, MD   Discharged Condition: good  Hospital Course:  Patient underwent the above stated procedure on 01/21/2016. Patient tolerated the procedure well and brought to the recovery room in good condition and subsequently to the floor.  POD #1 BP: 134/60 ; Pulse: 76 ; Temp: 97.6 F (36.4 C) ; Resp: 18 Patient reports pain as mild, pain controlled. No events throughout the night. Worked well with PT. Ready to go home. Dorsiflexion/plantar flexion intact, incision: dressing C/D/I, no cellulitis present and compartment soft.   LABS  Basename    HGB     10.0  HCT     29.3    Discharge Exam: General appearance: alert, cooperative and no distress Extremities: Homans sign is negative, no sign of DVT, no edema, redness or tenderness  in the calves or thighs and no ulcers, gangrene or trophic changes  Disposition: Home with follow up in 2 weeks   Follow-up Information    Follow up with Mauri Pole, MD. Schedule an appointment as soon as possible for a visit in 2 weeks.   Specialty:  Orthopedic Surgery   Contact information:   [address] [place] [address] [id number]       Discharge Instructions    Call MD / Call 911    Complete by:  As directed   If you experience chest pain or shortness of breath,  CALL 911 and be transported to the hospital emergency room.  If you develope a fever above 101 F, pus (white drainage) or increased drainage or redness at the wound, or calf pain, call your surgeon's office.     Change dressing    Complete by:  As directed   Maintain surgical dressing until follow up in the clinic. If the edges start to pull up, may reinforce with tape. If the dressing is no longer working, may remove and cover with gauze and tape, but must keep the area dry and clean.  Call with any questions or concerns.     Constipation Prevention    Complete by:  As directed   Drink plenty of fluids.  Prune juice may be helpful.  You may use a stool softener, such as Colace (over the counter) 100 mg twice a day.  Use MiraLax (over the counter) for constipation as needed.     Diet - low sodium heart healthy    Complete by:  As directed      Discharge instructions    Complete by:  As directed   Maintain surgical dressing until follow up in the clinic. If the edges start to pull up, may reinforce with tape. If the dressing is no longer working, may remove and cover with gauze and tape, but must keep the area dry and clean.  Follow up in 2 weeks at [facility]. Call with any questions or concerns.     Increase activity slowly as tolerated    Complete by:  As directed   Weight bearing as tolerated with assist device (walker, cane, etc) as directed, use it as long as suggested by your surgeon or therapist, typically at least 4-6 weeks.     TED hose    Complete by:  As directed   Use stockings (TED hose) for 2 weeks on both leg(s).  You may remove them at night for sleeping.             Medication List    STOP taking these medications        ARTHRITIS PAIN RELIEF PO     doxycycline 100 MG tablet  Commonly known as:  VIBRA-TABS     traMADol 50 MG tablet  Commonly known as:  ULTRAM      TAKE these medications        aspirin 325 MG EC tablet  Take 1 tablet (325 mg total)  by mouth 2 (two) times daily.     bimatoprost 0.03 % ophthalmic solution  Commonly known as:  LUMIGAN  Place 1 drop into both eyes at bedtime.     calcitRIOL 0.25 MCG capsule  Commonly known as:  ROCALTROL  Take 0.25 mcg by mouth daily.     cetirizine 10 MG tablet  Commonly known as:  ZYRTEC  Take 10 mg by mouth daily.  cholecalciferol 1000 units tablet  Commonly known as:  VITAMIN D  Take 1,000 Units by mouth daily.     cyanocobalamin 500 MCG tablet  Take 500 mcg by mouth daily.     docusate sodium 100 MG capsule  Commonly known as:  COLACE  Take 1 capsule (100 mg total) by mouth 2 (two) times daily.     donepezil 10 MG tablet  Commonly known as:  ARICEPT  Take 10 mg by mouth at bedtime.     ferrous sulfate 325 (65 FE) MG tablet  Take 1 tablet (325 mg total) by mouth 3 (three) times daily after meals.     fish oil-omega-3 fatty acids 1000 MG capsule  Take 2 g by mouth 2 (two) times daily.     gabapentin 300 MG capsule  Commonly known as:  NEURONTIN  Take 300 mg by mouth 2 (two) times daily.     HYDROcodone-acetaminophen 7.5-325 MG tablet  Commonly known as:  NORCO  Take 1-2 tablets by mouth every 4 (four) hours as needed for moderate pain.     Insulin Glargine 300 UNIT/ML Sopn  Commonly known as:  TOUJEO SOLOSTAR  Inject 15 Units into the skin daily.     lansoprazole 30 MG capsule  Commonly known as:  PREVACID  Take 30 mg by mouth daily as needed (For acid reflux.).     levothyroxine 50 MCG tablet  Commonly known as:  SYNTHROID, LEVOTHROID  Take 50 mcg by mouth daily before breakfast.     meclizine 25 MG tablet  Commonly known as:  ANTIVERT  Take 25 mg by mouth as needed (For vertigo.).     ONE TOUCH ULTRA TEST test strip  Generic drug:  glucose blood  TEST 1 TIME DAILY.     polyethylene glycol packet  Commonly known as:  MIRALAX / GLYCOLAX  Take 17 g by mouth 2 (two) times daily.     simvastatin 40 MG tablet  Commonly known as:  ZOCOR  Take 40  mg by mouth at bedtime.     sitaGLIPtin 25 MG tablet  Commonly known as:  JANUVIA  Take 25 mg by mouth daily with breakfast.     sodium bicarbonate 650 MG tablet  Take 1 tablet (650 mg total) by mouth 2 (two) times daily.     tamsulosin 0.4 MG Caps capsule  Commonly known as:  FLOMAX  Take 1 capsule (0.4 mg total) by mouth daily after supper.     tiZANidine 4 MG tablet  Commonly known as:  ZANAFLEX  Take 1 tablet (4 mg total) by mouth every 6 (six) hours as needed for muscle spasms.         Signed: [place] P[place]. Rosan Calbert   PA-C  01/28/2016, 4:37 PM

## 2016-01-30 ENCOUNTER — Ambulatory Visit: Payer: Self-pay | Admitting: *Deleted

## 2016-02-04 ENCOUNTER — Other Ambulatory Visit: Payer: Self-pay | Admitting: *Deleted

## 2016-02-04 NOTE — Patient Outreach (Signed)
Transition of care call #2. Pt is doing very well status post L hip surgery. He is actually up and walking with a cane. He has only moderate pain. He will see his orthopedic surgeon tomorrow. He did not tolerate his prescribed muscle relaxer in conjuction with his narcotic pain reliever. He had bizarre thoughts and dreams and it was not a pleasant experience for him or Mrs. Bailly and their daughter.  I have reinforced for them to call if any problems arise. I will call again in a week.  Deloria Lair [facility] [place] [phone]

## 2016-02-05 DIAGNOSIS — M1612 Unilateral primary osteoarthritis, left hip: Secondary | ICD-10-CM | POA: Diagnosis not present

## 2016-02-05 DIAGNOSIS — Z96642 Presence of left artificial hip joint: Secondary | ICD-10-CM | POA: Diagnosis not present

## 2016-02-05 DIAGNOSIS — Z471 Aftercare following joint replacement surgery: Secondary | ICD-10-CM | POA: Diagnosis not present

## 2016-02-11 ENCOUNTER — Other Ambulatory Visit: Payer: Self-pay | Admitting: *Deleted

## 2016-02-12 NOTE — Patient Outreach (Signed)
Transition of care call. [name] states her husband is doing very well. He is walking around the house with his walker but they have not been out of the house. He usually gets by with taking only 2 pain pills per day, occasionally he has to take a 3rd. I have reminded her that I am available to them if they need me. I will call back next Tuesday.  Patrick Lair [facility] [place] [phone]

## 2016-02-18 ENCOUNTER — Other Ambulatory Visit: Payer: Self-pay | Admitting: *Deleted

## 2016-02-19 NOTE — Patient Outreach (Signed)
Transition of care call. Mrs. Mcglynn says her husband is doing well. He did however pull a muscle a few days ago and is taking tramadol now for pain and is off the hydrocodone. He is getting around well with his walker. I have requested an in home visit for next week. I will see them on Tuesday, June 27 around noon.  Deloria Lair [facility] [place] [phone]

## 2016-02-25 ENCOUNTER — Other Ambulatory Visit: Payer: Self-pay | Admitting: *Deleted

## 2016-02-25 NOTE — Patient Outreach (Signed)
[facility]) Care Management  02/25/2016  LA CABREROS 1933-11-09 [id number]   Home visit in lieu of transition of care call.  Pt is doing exceptionally well. Walking with cane very well. Pain level improving. Has not taken any pain med today.  He reports SOB last night. He has not been very vigilent with taking his furosemide on a regular basis.Marland Kitchen  He will see Dr. Lorrene Reid tomorrow and Dr. Irish Lack on Thursday.  Pt states his pacemaker was stopped during surgery and was supposed to be restarted before leaving the surgical suite.  O:  FBS 135 (ate fruit last night, not usually that high, usually between 100-120       Bradycardic with murmur       Lungs clear       Edema 1-2+   A: S/P L hip replacement     Bradycardia with murmur - new for me to hear - has pacemaker - needs eval. Perhaps it is      not on since surgery????     DM - good contorl     CKD   P: Advise Dr. Irish Lack of cardiac irregularity      Take furosemide 40 mg now.      Will call pt next week.  Deloria Lair [facility] [phone]

## 2016-02-26 ENCOUNTER — Telehealth: Payer: Self-pay | Admitting: Cardiology

## 2016-02-26 DIAGNOSIS — E119 Type 2 diabetes mellitus without complications: Secondary | ICD-10-CM | POA: Diagnosis not present

## 2016-02-26 DIAGNOSIS — M1612 Unilateral primary osteoarthritis, left hip: Secondary | ICD-10-CM | POA: Diagnosis not present

## 2016-02-26 DIAGNOSIS — D631 Anemia in chronic kidney disease: Secondary | ICD-10-CM | POA: Diagnosis not present

## 2016-02-26 DIAGNOSIS — N2581 Secondary hyperparathyroidism of renal origin: Secondary | ICD-10-CM | POA: Diagnosis not present

## 2016-02-26 DIAGNOSIS — N390 Urinary tract infection, site not specified: Secondary | ICD-10-CM | POA: Diagnosis not present

## 2016-02-26 DIAGNOSIS — I770 Arteriovenous fistula, acquired: Secondary | ICD-10-CM | POA: Diagnosis not present

## 2016-02-26 DIAGNOSIS — N184 Chronic kidney disease, stage 4 (severe): Secondary | ICD-10-CM | POA: Diagnosis not present

## 2016-02-26 DIAGNOSIS — N320 Bladder-neck obstruction: Secondary | ICD-10-CM | POA: Diagnosis not present

## 2016-02-26 DIAGNOSIS — N189 Chronic kidney disease, unspecified: Secondary | ICD-10-CM | POA: Diagnosis not present

## 2016-02-26 DIAGNOSIS — F0390 Unspecified dementia without behavioral disturbance: Secondary | ICD-10-CM | POA: Diagnosis not present

## 2016-02-26 DIAGNOSIS — E1122 Type 2 diabetes mellitus with diabetic chronic kidney disease: Secondary | ICD-10-CM | POA: Diagnosis not present

## 2016-02-26 DIAGNOSIS — I2510 Atherosclerotic heart disease of native coronary artery without angina pectoris: Secondary | ICD-10-CM | POA: Diagnosis not present

## 2016-02-26 NOTE — Progress Notes (Signed)
Patient ID: Patrick Boyle, male   DOB: [birth year], 82 y.o.   MRN: [id number]     Cardiology Office Note   Date:  02/27/2016   ID:  Patrick, Boyle [birth year], MRN [id number]  PCP:  Patrick Melter, MD    No chief complaint on file.    Wt Readings from Last 3 Encounters:  02/27/16 179 lb (81.194 kg)  02/25/16 177 lb (80.287 kg)  01/21/16 188 lb (85.276 kg)       History of Present Illness: Patrick Boyle is a 82 y.o. male  With a a h/o CAD and ischemic cardiomyopathy who was found to have an irregular heartbeat on a physical by PMD in early 2014. ECG revealed AFib. He had not noticed any problems. He cannot exercise too much due to a left leg amputation. He does work in the yard and gets [employer].  Between 2013 and 2014, he has several syncopal spells.  THis resolved with stopping BP meds.  No frequent high BP readings.   Chronically, he had urinary obstruction. He now has to self catheterize. He developed some advanced renal insufficiency with creatinines in the 2.5 range.  He had hip surgery in May 2017. He has done well with that. No cardiac issues at the time of surgery.    Past Medical History  Diagnosis Date  . Hyperlipidemia   . Hypertension   . Coronary artery disease     s/p CABG 1997  . GERD (gastroesophageal reflux disease)   . Arthritis   . Allergic rhinitis   . Hyperplastic colon polyp   . Diverticulitis, colon   . Meniere's syndrome   . Tremor, essential     dr Jannifer Franklin- bengin   . COPD, mild ([place])     dr clance  . Ischemic cardiomyopathy     sp ICD (SJM) implanted by Dr Rayann Heman  . MI (myocardial infarction) ([place]) 1997, 2005  . AICD (automatic cardioverter/defibrillator) present   . Presence of permanent cardiac pacemaker   . CHF (congestive heart failure) ([place])   . Diabetes mellitus     dr Olen Pel- Type II  . Hypothyroidism   . Dementia   . Chronic kidney disease   . Self-catheterizes urinary bladder     bid-   . History of blood transfusion   .  Anemia   . HOH (hard of hearing)     Past Surgical History  Procedure Laterality Date  . Coronary artery bypass graft  [place]  . Cardiac catheterization  2005    medical therapy  . Cardiac defibrillator placement      ICD implanted by Dr Rayann Heman, Analyze ST study patient  . Esophagogastroduodenoscopy N/A 08/25/2014    Procedure: ESOPHAGOGASTRODUODENOSCOPY (EGD);  Surgeon: Lear Ng, MD;  Location: [facility];  Service: Endoscopy;  Laterality: N/A;  . Colonoscopy N/A 08/25/2014    Procedure: COLONOSCOPY;  Surgeon: Lear Ng, MD;  Location: [facility];  Service: Endoscopy;  Laterality: N/A;  . Eye surgery Bilateral     cartaract  . Joint replacement Right 2004  . Av fistula placement Right 05/01/2015    Procedure: RIGHT RADIOCEPHALIC ARTERIOVENOUS (AV) FISTULA CREATION;  Surgeon: Elam Dutch, MD;  Location: [facility];  Service: Vascular;  Laterality: Right;  . Total hip arthroplasty Left 01/21/2016    Procedure: LEFT TOTAL HIP ARTHROPLASTY ANTERIOR APPROACH;  Surgeon: Paralee Cancel, MD;  Location: [facility];  Service: Orthopedics;  Laterality: Left;     Current Outpatient  Prescriptions  Medication Sig Dispense Refill  . acetaminophen (TYLENOL 8 HOUR ARTHRITIS PAIN) 650 MG CR tablet Take 650 mg by mouth every 8 (eight) hours as needed for pain.    Marland Kitchen aspirin 81 MG tablet Take 81 mg by mouth daily.    . bimatoprost (LUMIGAN) 0.03 % ophthalmic solution Place 1 drop into both eyes at bedtime.     . calcitRIOL (ROCALTROL) 0.25 MCG capsule Take 0.25 mcg by mouth daily.    . cetirizine (ZYRTEC) 10 MG tablet Take 10 mg by mouth daily.     . cholecalciferol (VITAMIN D) 1000 UNITS tablet Take 1,000 Units by mouth daily.    . cyanocobalamin 500 MCG tablet Take 500 mcg by mouth daily.    Marland Kitchen donepezil (ARICEPT) 10 MG tablet Take 10 mg by mouth at bedtime.    . fish oil-omega-3 fatty acids 1000 MG capsule Take 2 g by mouth 2 (two) times daily.     . furosemide (LASIX) 20 MG  tablet Take 20 mg by mouth every other day.    . gabapentin (NEURONTIN) 300 MG capsule Take 300 mg by mouth 2 (two) times daily.     . Insulin Glargine (TOUJEO SOLOSTAR) 300 UNIT/ML SOPN Inject 30 Units into the skin at bedtime.    . lansoprazole (PREVACID) 30 MG capsule Take 30 mg by mouth daily as needed (For acid reflux.).     Marland Kitchen levothyroxine (SYNTHROID, LEVOTHROID) 50 MCG tablet Take 50 mcg by mouth daily before breakfast.    . Meclizine HCl (ANTIVERT PO) Take 5 mg by mouth as needed (VERTIGO).    . ONE TOUCH ULTRA TEST test strip TEST 1 TIME DAILY.  3  . simvastatin (ZOCOR) 40 MG tablet Take 40 mg by mouth at bedtime.      . sitaGLIPtin (JANUVIA) 25 MG tablet Take 25 mg by mouth daily with breakfast.     . sodium bicarbonate 650 MG tablet Take 1 tablet (650 mg total) by mouth 2 (two) times daily. 60 tablet 1  . tamsulosin (FLOMAX) 0.4 MG CAPS capsule Take 1 capsule (0.4 mg total) by mouth daily after supper. 30 capsule 1  . traMADol (ULTRAM) 50 MG tablet Take 50 mg by mouth every 6 (six) hours as needed. for pain  2   No current facility-administered medications for this visit.    Allergies:   Amoxicillin; Metformin and related; and Ramipril    Social History:  The patient  reports that he quit smoking about 20 years ago. His smoking use included Cigarettes. He quit after 35 years of use. He does not have any smokeless tobacco history on file. He reports that he drinks alcohol. He reports that he does not use illicit drugs.   Family History:  The patient's family history includes Diabetes in his mother; Parkinsonism in his father; Stomach cancer in his mother. There is no history of Heart attack or Hypertension.    ROS:  Please see the history of present illness.   Otherwise, review of systems are positive for foot pain due to neuropathy.   All other systems are reviewed and negative.    PHYSICAL EXAM: VS:  BP 120/60 mmHg  Pulse 60  Ht 5\' 11"  (1.803 m)  Wt 179 lb (81.194 kg)  BMI  24.98 kg/m2 , BMI Body mass index is 24.98 kg/(m^2). GEN: Well nourished, well developed, in no acute distress HEENT: normal Neck: no JVD, carotid bruits, or masses Cardiac: *RRR; no murmurs, rubs, or gallops,no edema  Respiratory:  clear to auscultation bilaterally, normal work of breathing GI: soft, nontender, nondistended, + BS MS: no deformity or atrophy Skin: warm and dry, no rash Neuro:  Strength and sensation are intact Psych: euthymic mood, full affect    Recent Labs: 12/11/2015: ALT 11* 01/22/2016: BUN 43*; Creatinine, Ser 2.50*; Hemoglobin 10.0*; Platelets 123*; Potassium 5.5*; Sodium 134*   Lipid Panel No results found for: CHOL, TRIG, HDL, CHOLHDL, VLDL, LDLCALC, LDLDIRECT   Other studies Reviewed: Additional studies/ records that were reviewed today with results demonstrating: hospital records.   ASSESSMENT AND PLAN:  1. CAD: Continue aspirin 81 mg daily. No angina.  H/o MI with ischemic cardiomyopathy.  Tolerated hip surgery well in May 2017. No issues at that time. 2. HTN: Controlled. Continue to hold losartan and Toprol. Let us know if he develops elevated blood pressure readings. For now, he has had some lower readings. Less orthostatic symptoms. Overall, improved off of metoprolol. No further syncope.   3. Hyperlipidemia: Continue simvastatin.  4. Ischemic cardiomyopathy: s/p AICD.  Euvolemic.  Has been taking furosemide every other day or somewhat more frequently if needed.   5. Urinary retention.  He has to self cath twice a day.  6. DM:  Fasting BS . 200.  Meds limited by renal function. Stresed importance of trying to exercise to help reduce blood sugar.    Current medicines are reviewed at length with the patient today.  The patient concerns regarding his medicines were addressed.  The following changes have been made:  No change  Labs/ tests ordered today include: obtain from Dr. Maceo Pro. No orders of the defined types were placed in this encounter.     Recommend 150 minutes/week of aerobic exercise Low fat, low carb, high fiber diet recommended  Disposition:   FU in 1 year   Signed, Larae Grooms, MD  02/27/2016 11:14 AM    [facility] [place], [place], [place]  [address] Phone: [phone]; Fax: [phone]

## 2016-02-26 NOTE — Telephone Encounter (Signed)
Remote transmission received. Device tech reviewed and stated that device function is normal. Gave device report print out to MD nurse.

## 2016-02-26 NOTE — Telephone Encounter (Signed)
[facility] nurse Amada Jupiter called and stated that she believes pt implanted device isn't working b/c his heart rate is in the 40's. I informed nurse that pt heart rate could be in the 40's b/c pt device is programmed VVI 40. She verbalized understanding and stated that pt is also experiencing shortness of breath. Called pt and spoke w/ pt wife she is going to have pt send a remote transmission before his appt tomorrow. Pt wife verbalized understanding.

## 2016-02-27 ENCOUNTER — Encounter: Payer: Self-pay | Admitting: Interventional Cardiology

## 2016-02-27 ENCOUNTER — Ambulatory Visit (INDEPENDENT_AMBULATORY_CARE_PROVIDER_SITE_OTHER): Payer: Medicare Other | Admitting: Interventional Cardiology

## 2016-02-27 VITALS — BP 120/60 | HR 60 | Ht 71.0 in | Wt 179.0 lb

## 2016-02-27 DIAGNOSIS — I255 Ischemic cardiomyopathy: Secondary | ICD-10-CM

## 2016-02-27 DIAGNOSIS — I2510 Atherosclerotic heart disease of native coronary artery without angina pectoris: Secondary | ICD-10-CM

## 2016-02-27 DIAGNOSIS — I10 Essential (primary) hypertension: Secondary | ICD-10-CM | POA: Diagnosis not present

## 2016-02-27 NOTE — Patient Instructions (Signed)
**Note De-Identified  Obfuscation** Medication Instructions:  Same-no changes  Labwork: None  Testing/Procedures: None  Follow-Up: Your physician wants you to follow-up in: September of 2018. You will receive a reminder letter in the mail two months in advance. If you don't receive a letter, please call our office to schedule the follow-up appointment.     If you need a refill on your cardiac medications before your next appointment, please call your pharmacy.

## 2016-03-04 ENCOUNTER — Encounter: Payer: Self-pay | Admitting: *Deleted

## 2016-03-04 ENCOUNTER — Other Ambulatory Visit: Payer: Self-pay | Admitting: *Deleted

## 2016-03-04 NOTE — Patient Outreach (Signed)
Final transition of care call. I spoke with Patrick Boyle. Pt's HCPOA, and she reports his cardiology check up and nephrology check ups last week were very positive. He does not need to follow up with cardiology for a year and 6 months for nephrology. Pt is now walking without assist around the house and has minimal discomfort from his hip replacement.  I advised [name] that this would be my final call but that as I have advised them before if they needs something they can always call me.  Deloria Lair [facility] [place] [phone]

## 2016-03-08 IMAGING — US US RENAL
1 series · 14 of 25 positions shown · non-contrast
Comparison: 05/15/2014.

CLINICAL DATA: 80-year-old male with acute renal failure. Initial
encounter. Current history of Chronic kidney disease.

EXAM:
RENAL/URINARY TRACT ULTRASOUND COMPLETE

[Series 1: us renal · 0.25mm/px · 14 of 33 slices shown]
[im 1/33]
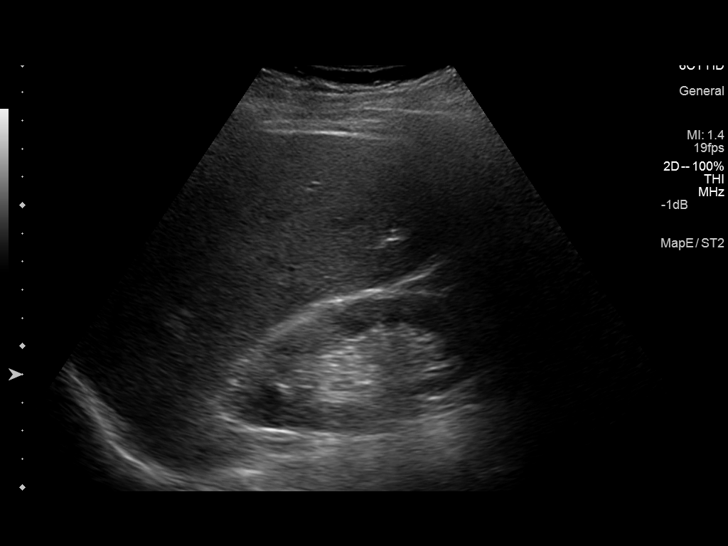
[im 3/33]
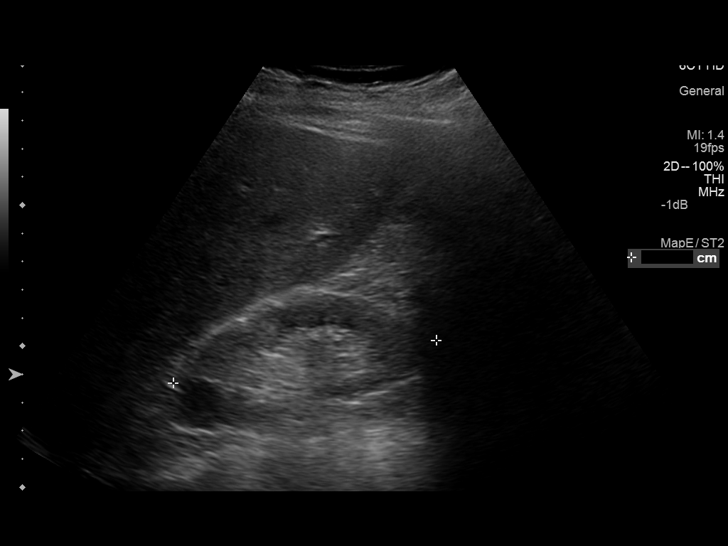
[im 6/33]
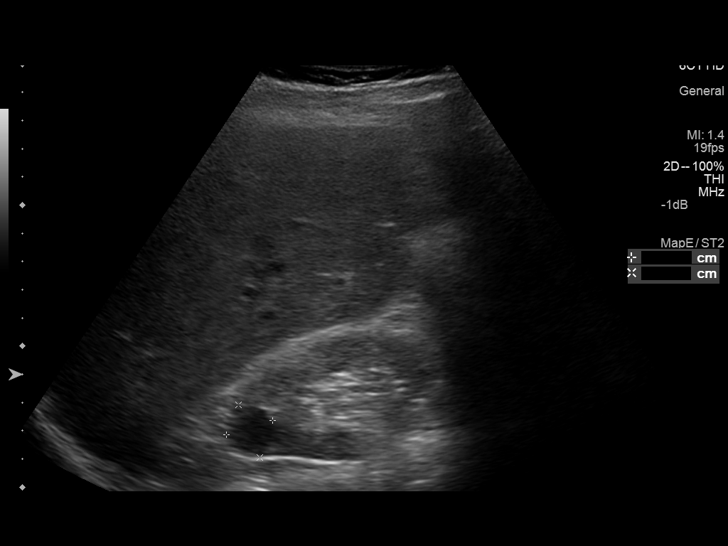
[im 9/33]
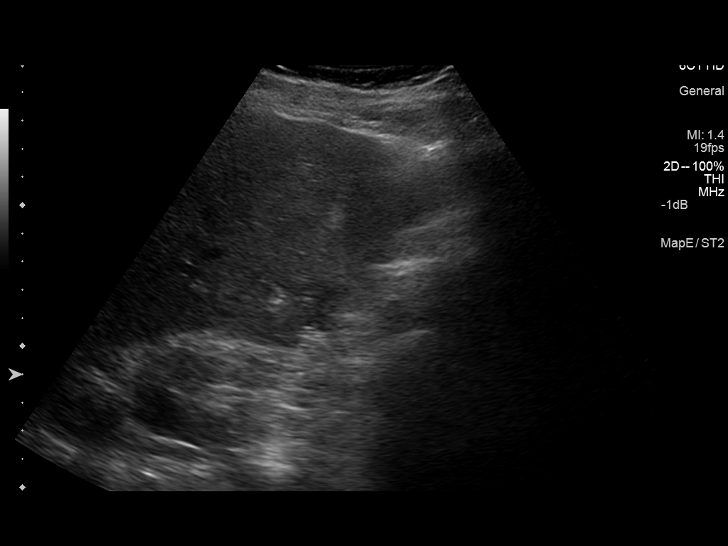
[im 11/33]
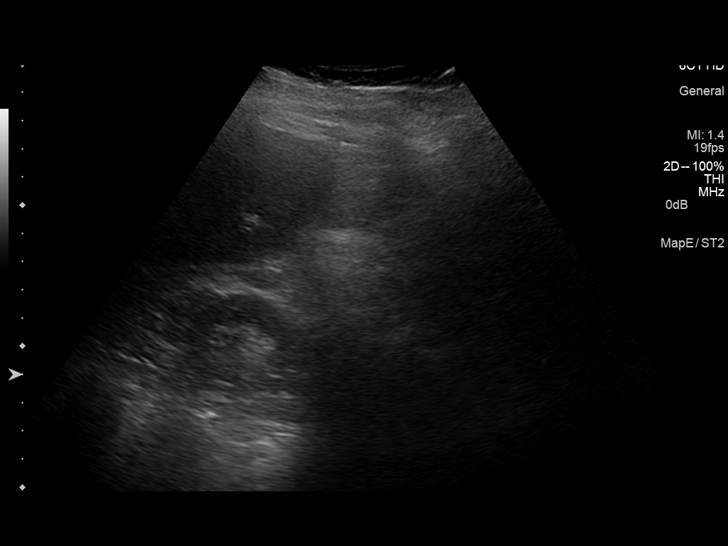
[im 13/33]
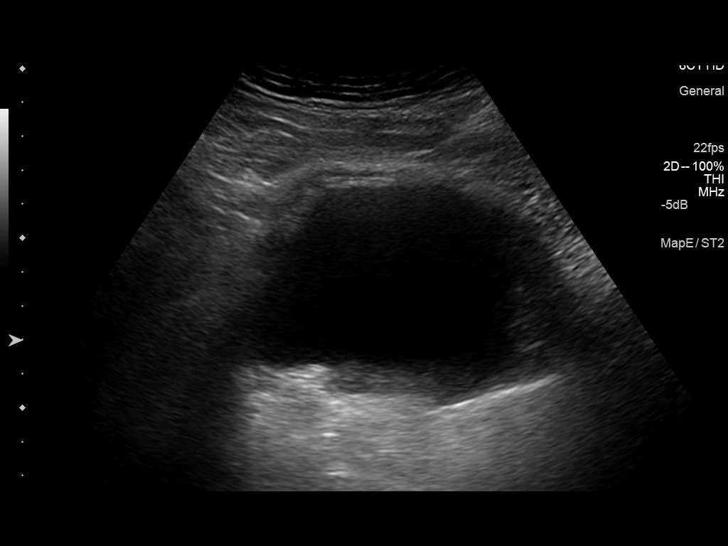
[im 15/33]
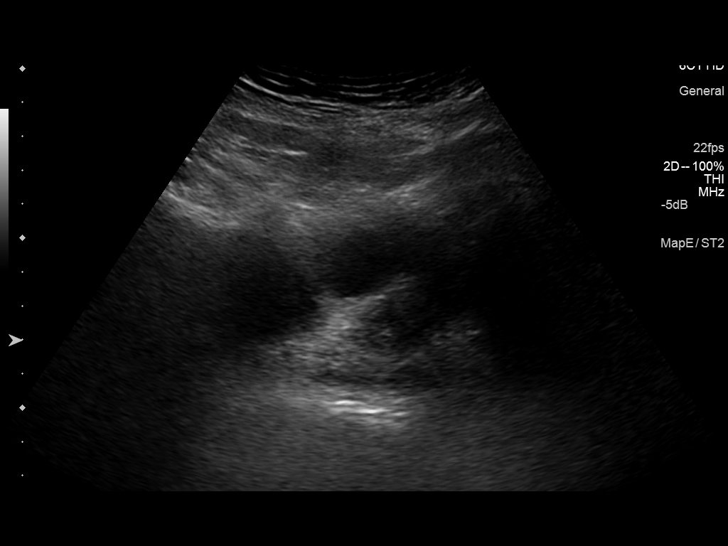
[im 18/33]
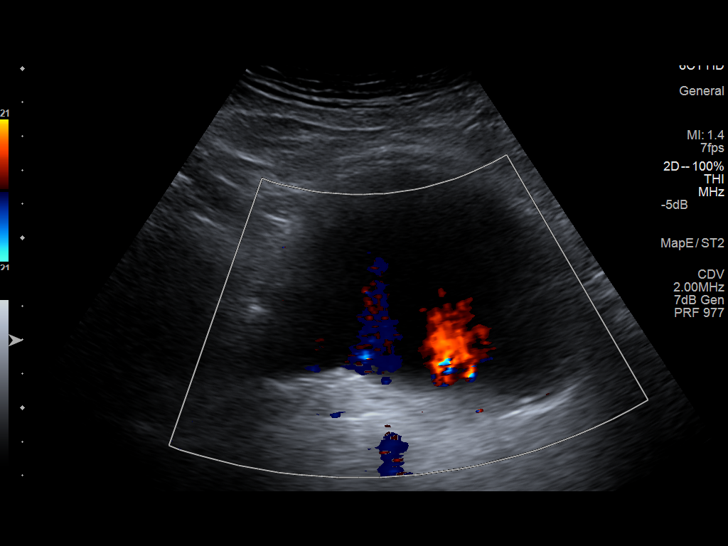
[im 21/33]
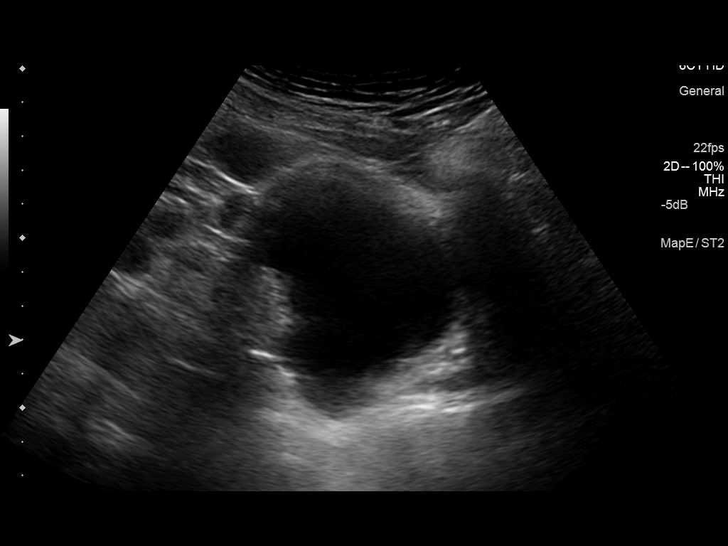
[im 22/33]
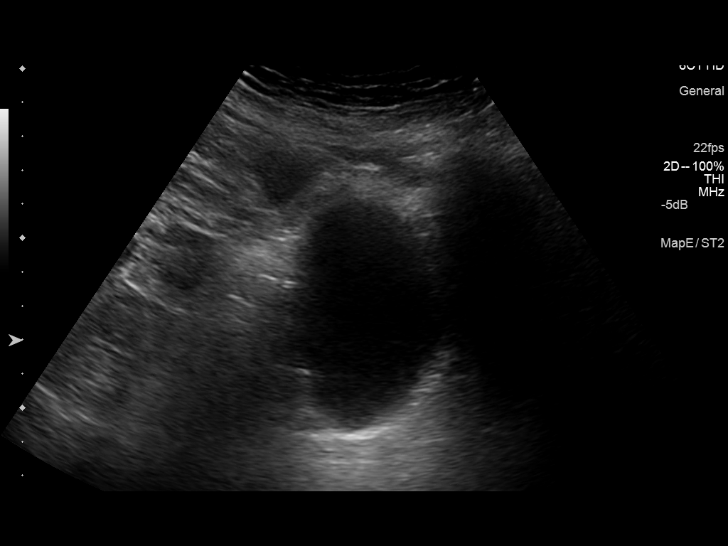
[im 25/33]
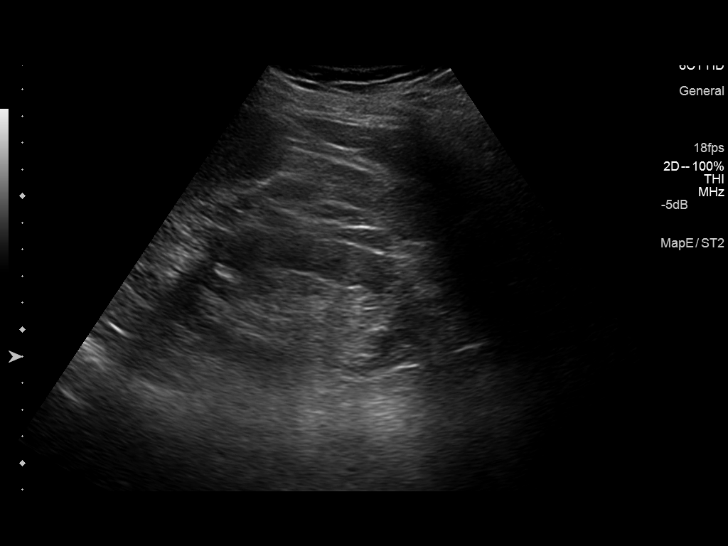
[im 27/33]
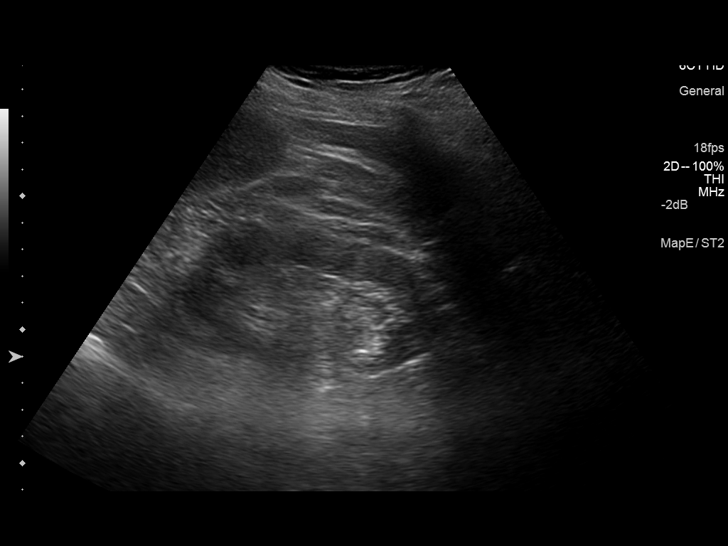
[im 30/33]
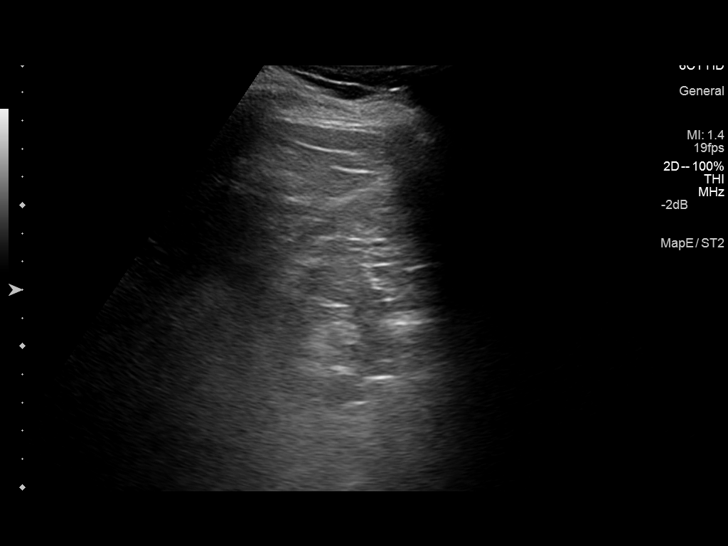
[im 33/33]
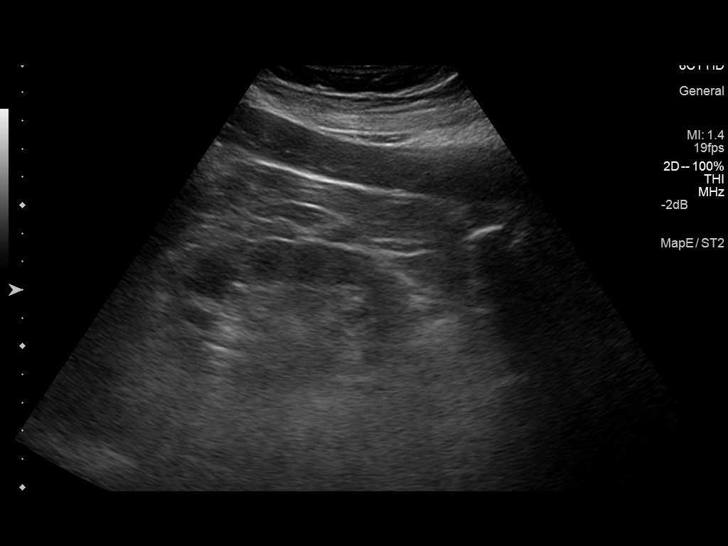

[14 of 25 positions shown; findings below may reference images not displayed]

FINDINGS: Right Kidney:

Length: 9.5 cm. Unchanged upper pole simple cyst measuring up to
cm.. Echogenicity Stable and within normal limits. No mass or
hydronephrosis visualized.

Left Kidney:

Length: 9.7 cm. Echogenicity Stable and within normal limits. No
mass or hydronephrosis visualized.

Bladder:

Appears normal for degree of bladder distention. Bilateral ureteral
jets detected.
IMPRESSION: Stable sonographic appearance of the kidneys since [REDACTED], no
obstructive uropathy.

## 2016-03-11 DIAGNOSIS — Z96642 Presence of left artificial hip joint: Secondary | ICD-10-CM | POA: Diagnosis not present

## 2016-03-11 DIAGNOSIS — Z471 Aftercare following joint replacement surgery: Secondary | ICD-10-CM | POA: Diagnosis not present

## 2016-03-16 ENCOUNTER — Ambulatory Visit (INDEPENDENT_AMBULATORY_CARE_PROVIDER_SITE_OTHER): Payer: Medicare Other | Admitting: *Deleted

## 2016-03-16 DIAGNOSIS — I255 Ischemic cardiomyopathy: Secondary | ICD-10-CM

## 2016-03-16 NOTE — Progress Notes (Signed)
Remote ICD transmission.   

## 2016-03-18 ENCOUNTER — Ambulatory Visit (INDEPENDENT_AMBULATORY_CARE_PROVIDER_SITE_OTHER): Payer: Medicare Other | Admitting: Podiatry

## 2016-03-18 ENCOUNTER — Encounter: Payer: Self-pay | Admitting: Cardiology

## 2016-03-18 ENCOUNTER — Encounter: Payer: Self-pay | Admitting: Podiatry

## 2016-03-18 DIAGNOSIS — B351 Tinea unguium: Secondary | ICD-10-CM

## 2016-03-18 DIAGNOSIS — M79609 Pain in unspecified limb: Principal | ICD-10-CM

## 2016-03-18 NOTE — Progress Notes (Signed)
Subjective:     Patient ID: Patrick Boyle, male   DOB: 03-30-1934, 82 y.o.   MRN: [id number]  HPI this patient returns to the office for preventive foot care services.  He says the nails are long and painful walking and wearing his shoes.  He presents for preventive foot care services.  He states he had hip surgery since last visit. Review of Systems     Objective:   Physical Exam  Objective:   Physical Exam GENERAL APPEARANCE: Alert, conversant. Appropriately groomed. No acute distress.  VASCULAR: Pedal pulses are palpable at [facility] and PT bilateral. Capillary refill time is immediate to all digits, Normal temperature gradient.  NEUROLOGIC: sensation is normal to 5.07 monofilament at 5/5 sites bilateral. Light touch is intact bilateral, Muscle strength normal.  MUSCULOSKELETAL: acceptable muscle strength, tone and stability bilateral. Intrinsic muscluature intact bilateral. Rectus appearance ofat this visit.Marland Kitchen  DERMATOLOGIC: skin color, texture, and turgor are within normal limits. No preulcerative lesions or ulcers are seen, no interdigital maceration noted. No open lesions present. Thick disfigured discolored nails both feet.            Assessment:    Pain due to onychomycosis    Plan:    Debridement and grinding long thick nails both feet.  RTC 3 months.   Gardiner Barefoot DPM

## 2016-03-19 LAB — CUP PACEART REMOTE DEVICE CHECK
Battery Remaining Longevity: 67 mo
Battery Voltage: 2.96 V
Brady Statistic RV Percent Paced: 3.5 %
Date Time Interrogation Session: 20170717062155
HIGH POWER IMPEDANCE MEASURED VALUE: 60 Ohm
HighPow Impedance: 60 Ohm
Implantable Lead Implant Date: 20121011
Implantable Lead Location: 753860
Lead Channel Setting Pacing Pulse Width: 0.5 ms
Lead Channel Setting Sensing Sensitivity: 0.5 mV
MDC IDC MSMT BATTERY REMAINING PERCENTAGE: 58 %
MDC IDC MSMT LEADCHNL RV IMPEDANCE VALUE: 340 Ohm
MDC IDC MSMT LEADCHNL RV SENSING INTR AMPL: 12 mV
MDC IDC SET LEADCHNL RV PACING AMPLITUDE: 2.5 V
Pulse Gen Serial Number: 1016528

## 2016-03-30 DIAGNOSIS — H6120 Impacted cerumen, unspecified ear: Secondary | ICD-10-CM | POA: Diagnosis not present

## 2016-03-30 DIAGNOSIS — J069 Acute upper respiratory infection, unspecified: Secondary | ICD-10-CM | POA: Diagnosis not present

## 2016-03-31 DIAGNOSIS — Z85828 Personal history of other malignant neoplasm of skin: Secondary | ICD-10-CM | POA: Diagnosis not present

## 2016-03-31 DIAGNOSIS — Z08 Encounter for follow-up examination after completed treatment for malignant neoplasm: Secondary | ICD-10-CM | POA: Diagnosis not present

## 2016-03-31 DIAGNOSIS — L570 Actinic keratosis: Secondary | ICD-10-CM | POA: Diagnosis not present

## 2016-04-03 ENCOUNTER — Emergency Department (HOSPITAL_COMMUNITY): Payer: Medicare Other

## 2016-04-03 ENCOUNTER — Encounter (HOSPITAL_COMMUNITY): Payer: Self-pay | Admitting: Emergency Medicine

## 2016-04-03 ENCOUNTER — Inpatient Hospital Stay (HOSPITAL_COMMUNITY)
Admission: EM | Admit: 2016-04-03 | Discharge: 2016-04-05 | DRG: 291 | Disposition: A | Discharge status: Home / Self Care | Payer: Medicare Other | Attending: Internal Medicine | Admitting: Internal Medicine

## 2016-04-03 DIAGNOSIS — E1122 Type 2 diabetes mellitus with diabetic chronic kidney disease: Secondary | ICD-10-CM | POA: Diagnosis present

## 2016-04-03 DIAGNOSIS — J449 Chronic obstructive pulmonary disease, unspecified: Secondary | ICD-10-CM | POA: Diagnosis present

## 2016-04-03 DIAGNOSIS — N184 Chronic kidney disease, stage 4 (severe): Secondary | ICD-10-CM | POA: Diagnosis present

## 2016-04-03 DIAGNOSIS — I5023 Acute on chronic systolic (congestive) heart failure: Secondary | ICD-10-CM | POA: Diagnosis present

## 2016-04-03 DIAGNOSIS — R0600 Dyspnea, unspecified: Secondary | ICD-10-CM | POA: Diagnosis not present

## 2016-04-03 DIAGNOSIS — M1990 Unspecified osteoarthritis, unspecified site: Secondary | ICD-10-CM | POA: Diagnosis present

## 2016-04-03 DIAGNOSIS — R262 Difficulty in walking, not elsewhere classified: Secondary | ICD-10-CM | POA: Diagnosis present

## 2016-04-03 DIAGNOSIS — E1121 Type 2 diabetes mellitus with diabetic nephropathy: Secondary | ICD-10-CM | POA: Diagnosis not present

## 2016-04-03 DIAGNOSIS — E1165 Type 2 diabetes mellitus with hyperglycemia: Secondary | ICD-10-CM | POA: Diagnosis present

## 2016-04-03 DIAGNOSIS — R0602 Shortness of breath: Secondary | ICD-10-CM | POA: Diagnosis not present

## 2016-04-03 DIAGNOSIS — I4891 Unspecified atrial fibrillation: Secondary | ICD-10-CM

## 2016-04-03 DIAGNOSIS — IMO0002 Reserved for concepts with insufficient information to code with codable children: Secondary | ICD-10-CM

## 2016-04-03 DIAGNOSIS — N189 Chronic kidney disease, unspecified: Secondary | ICD-10-CM | POA: Diagnosis not present

## 2016-04-03 DIAGNOSIS — E1142 Type 2 diabetes mellitus with diabetic polyneuropathy: Secondary | ICD-10-CM | POA: Diagnosis not present

## 2016-04-03 DIAGNOSIS — Z7982 Long term (current) use of aspirin: Secondary | ICD-10-CM

## 2016-04-03 DIAGNOSIS — E875 Hyperkalemia: Secondary | ICD-10-CM | POA: Diagnosis present

## 2016-04-03 DIAGNOSIS — I110 Hypertensive heart disease with heart failure: Secondary | ICD-10-CM | POA: Diagnosis not present

## 2016-04-03 DIAGNOSIS — R05 Cough: Secondary | ICD-10-CM | POA: Diagnosis not present

## 2016-04-03 DIAGNOSIS — Z794 Long term (current) use of insulin: Secondary | ICD-10-CM

## 2016-04-03 DIAGNOSIS — Z95810 Presence of automatic (implantable) cardiac defibrillator: Secondary | ICD-10-CM

## 2016-04-03 DIAGNOSIS — I10 Essential (primary) hypertension: Secondary | ICD-10-CM | POA: Diagnosis present

## 2016-04-03 DIAGNOSIS — Z87891 Personal history of nicotine dependence: Secondary | ICD-10-CM | POA: Diagnosis not present

## 2016-04-03 DIAGNOSIS — I255 Ischemic cardiomyopathy: Secondary | ICD-10-CM | POA: Diagnosis present

## 2016-04-03 DIAGNOSIS — I509 Heart failure, unspecified: Secondary | ICD-10-CM | POA: Diagnosis not present

## 2016-04-03 DIAGNOSIS — H9190 Unspecified hearing loss, unspecified ear: Secondary | ICD-10-CM | POA: Diagnosis present

## 2016-04-03 DIAGNOSIS — D649 Anemia, unspecified: Secondary | ICD-10-CM | POA: Diagnosis present

## 2016-04-03 DIAGNOSIS — D631 Anemia in chronic kidney disease: Secondary | ICD-10-CM

## 2016-04-03 DIAGNOSIS — I2510 Atherosclerotic heart disease of native coronary artery without angina pectoris: Secondary | ICD-10-CM | POA: Diagnosis present

## 2016-04-03 DIAGNOSIS — I130 Hypertensive heart and chronic kidney disease with heart failure and stage 1 through stage 4 chronic kidney disease, or unspecified chronic kidney disease: Secondary | ICD-10-CM | POA: Diagnosis not present

## 2016-04-03 DIAGNOSIS — Z96642 Presence of left artificial hip joint: Secondary | ICD-10-CM | POA: Diagnosis present

## 2016-04-03 DIAGNOSIS — E785 Hyperlipidemia, unspecified: Secondary | ICD-10-CM | POA: Diagnosis present

## 2016-04-03 DIAGNOSIS — K219 Gastro-esophageal reflux disease without esophagitis: Secondary | ICD-10-CM | POA: Diagnosis present

## 2016-04-03 DIAGNOSIS — I252 Old myocardial infarction: Secondary | ICD-10-CM

## 2016-04-03 DIAGNOSIS — F0390 Unspecified dementia without behavioral disturbance: Secondary | ICD-10-CM | POA: Diagnosis present

## 2016-04-03 DIAGNOSIS — N400 Enlarged prostate without lower urinary tract symptoms: Secondary | ICD-10-CM | POA: Diagnosis present

## 2016-04-03 DIAGNOSIS — Z951 Presence of aortocoronary bypass graft: Secondary | ICD-10-CM

## 2016-04-03 DIAGNOSIS — E039 Hypothyroidism, unspecified: Secondary | ICD-10-CM | POA: Diagnosis present

## 2016-04-03 DIAGNOSIS — H8109 Meniere's disease, unspecified ear: Secondary | ICD-10-CM | POA: Diagnosis present

## 2016-04-03 DIAGNOSIS — J438 Other emphysema: Secondary | ICD-10-CM | POA: Diagnosis not present

## 2016-04-03 DIAGNOSIS — I480 Paroxysmal atrial fibrillation: Secondary | ICD-10-CM | POA: Diagnosis present

## 2016-04-03 LAB — I-STAT TROPONIN, ED: Troponin i, poc: 0.02 ng/mL (ref 0.00–0.08)

## 2016-04-03 LAB — BASIC METABOLIC PANEL
Anion gap: 7 (ref 5–15)
BUN: 40 mg/dL — ABNORMAL HIGH (ref 6–20)
CALCIUM: 9.2 mg/dL (ref 8.9–10.3)
CHLORIDE: 108 mmol/L (ref 101–111)
CO2: 23 mmol/L (ref 22–32)
CREATININE: 2.56 mg/dL — AB (ref 0.61–1.24)
GFR calc Af Amer: 26 mL/min — ABNORMAL LOW (ref >60)
GFR calc non Af Amer: 22 mL/min — ABNORMAL LOW (ref >60)
GLUCOSE: 198 mg/dL — AB (ref 65–99)
Potassium: 5.7 mmol/L — ABNORMAL HIGH (ref 3.5–5.1)
Sodium: 138 mmol/L (ref 135–145)

## 2016-04-03 LAB — CBC WITH DIFFERENTIAL/PLATELET
Basophils Absolute: 0 10*3/uL (ref 0.0–0.1)
Basophils Relative: 1 %
EOS ABS: 0.2 10*3/uL (ref 0.0–0.7)
EOS PCT: 3 %
HCT: 36.7 % — ABNORMAL LOW (ref 39.0–52.0)
Hemoglobin: 11.4 g/dL — ABNORMAL LOW (ref 13.0–17.0)
LYMPHS ABS: 1.5 10*3/uL (ref 0.7–4.0)
Lymphocytes Relative: 23 %
MCH: 30.2 pg (ref 26.0–34.0)
MCHC: 31.1 g/dL (ref 30.0–36.0)
MCV: 97.3 fL (ref 78.0–100.0)
MONO ABS: 0.6 10*3/uL (ref 0.1–1.0)
MONOS PCT: 9 %
Neutro Abs: 4.3 10*3/uL (ref 1.7–7.7)
Neutrophils Relative %: 64 %
Platelets: 158 10*3/uL (ref 150–400)
RBC: 3.77 MIL/uL — ABNORMAL LOW (ref 4.22–5.81)
RDW: 14.4 % (ref 11.5–15.5)
WBC: 6.7 10*3/uL (ref 4.0–10.5)

## 2016-04-03 LAB — BRAIN NATRIURETIC PEPTIDE: B Natriuretic Peptide: 521.1 pg/mL — ABNORMAL HIGH (ref 0.0–100.0)

## 2016-04-03 MED ORDER — FUROSEMIDE 10 MG/ML IJ SOLN
80.0000 mg | Freq: Once | INTRAMUSCULAR | Status: AC
  Administered 2016-04-03: 80 mg via INTRAVENOUS
  Filled 2016-04-03: qty 8

## 2016-04-03 NOTE — ED Triage Notes (Signed)
Pt was seen at PCP for [facility]. Sent her for further evaluation. No distress at this time

## 2016-04-03 NOTE — ED Notes (Signed)
Patient transported to X-ray 

## 2016-04-03 NOTE — ED Provider Notes (Signed)
[facility] DEPT Provider Note   CSN: [id number] Arrival date & time: 04/03/16  2105  First Provider Contact:  First MD Initiated Contact with Patient 04/03/16 2121     History   Chief Complaint Chief Complaint  Patient presents with  . Shortness of Breath    HPI Patrick Boyle is a 82 y.o. male.  The history is provided by the patient, the EMS personnel and a relative. No language interpreter was used.  Shortness of Breath  This is a recurrent problem. The average episode lasts 2 days. The problem occurs rarely.The current episode started 2 days ago. The problem has been gradually worsening. Associated symptoms include cough, sputum production, hemoptysis, PND, orthopnea and leg swelling. Pertinent negatives include no fever, no sore throat, no ear pain, no wheezing, no chest pain, no vomiting, no abdominal pain and no rash. He has tried nothing for the symptoms. The treatment provided no relief. He has had no prior hospitalizations. Associated medical issues include CAD, heart failure and past MI.    Past Medical History:  Diagnosis Date  . AICD (automatic cardioverter/defibrillator) present   . Allergic rhinitis   . Anemia   . Arthritis   . CHF (congestive heart failure) ([place])   . Chronic kidney disease   . COPD, mild ([place])    dr clance  . Coronary artery disease    s/p CABG 1997  . Dementia   . Diabetes mellitus    dr Olen Pel- Type II  . Diverticulitis, colon   . GERD (gastroesophageal reflux disease)   . History of blood transfusion   . HOH (hard of hearing)   . Hyperlipidemia   . Hyperplastic colon polyp   . Hypertension   . Hypothyroidism   . Ischemic cardiomyopathy    sp ICD (SJM) implanted by Dr Rayann Heman  . Meniere's syndrome   . MI (myocardial infarction) ([place]) 1997, 2005  . Presence of permanent cardiac pacemaker   . Self-catheterizes urinary bladder    bid-   . Tremor, essential    dr Jannifer Franklin- bengin     Patient Active Problem List   Diagnosis Date  Noted  . Overweight (BMI 25.0-29.9) 01/22/2016  . S/P left THA, AA 01/21/2016  . CKD (chronic kidney disease) stage 4, GFR 15-29 ml/min (HCC) 12/11/2015  . Uncontrolled diabetes mellitus with diabetic nephropathy, with long-term current use of insulin ([place]) 12/11/2015  . Hypothyroidism 12/11/2015  . Thrombocytopenia ([place]) 12/11/2015  . Anemia 08/25/2014  . COPD (chronic obstructive pulmonary disease) ([place]) 08/08/2014  . Automatic implantable cardioverter-defibrillator in situ 08/02/2012  . Chronic systolic congestive heart failure ([place]) 05/18/2011  . PULMONARY NODULE 09/24/2010  . Hyperlipemia 09/02/2009  . Essential hypertension 09/02/2009  . Coronary atherosclerosis 09/02/2009  . ALLERGIC RHINITIS 09/02/2009  . GERD 09/02/2009    Past Surgical History:  Procedure Laterality Date  . AV FISTULA PLACEMENT Right 05/01/2015   Procedure: RIGHT RADIOCEPHALIC ARTERIOVENOUS (AV) FISTULA CREATION;  Surgeon: Elam Dutch, MD;  Location: [facility];  Service: Vascular;  Laterality: Right;  . CARDIAC CATHETERIZATION  2005   medical therapy  . CARDIAC DEFIBRILLATOR PLACEMENT     ICD implanted by Dr Rayann Heman, Analyze ST study patient  . COLONOSCOPY N/A 08/25/2014   Procedure: COLONOSCOPY;  Surgeon: Lear Ng, MD;  Location: [facility];  Service: Endoscopy;  Laterality: N/A;  . CORONARY ARTERY BYPASS GRAFT  1997   [place]  . ESOPHAGOGASTRODUODENOSCOPY N/A 08/25/2014   Procedure: ESOPHAGOGASTRODUODENOSCOPY (EGD);  Surgeon: Lear Ng, MD;  Location:  [facility];  Service: Endoscopy;  Laterality: N/A;  . EYE SURGERY Bilateral    cartaract  . JOINT REPLACEMENT Right 2004  . TOTAL HIP ARTHROPLASTY Left 01/21/2016   Procedure: LEFT TOTAL HIP ARTHROPLASTY ANTERIOR APPROACH;  Surgeon: Paralee Cancel, MD;  [facility]: [facility];  Service: Orthopedics;  Laterality: Left;       Home Medications    Prior to Admission medications   Medication Sig Start Date End Date Taking? Authorizing  Provider  acetaminophen (TYLENOL 8 HOUR ARTHRITIS PAIN) 650 MG CR tablet Take 650 mg by mouth every 8 (eight) hours as needed for pain.   Yes Historical Provider, MD  aspirin 81 MG tablet Take 81 mg by mouth daily.   Yes Historical Provider, MD  bimatoprost (LUMIGAN) 0.03 % ophthalmic solution Place 1 drop into both eyes at bedtime.    Yes Historical Provider, MD  calcitRIOL (ROCALTROL) 0.25 MCG capsule Take 0.25 mcg by mouth daily.   Yes Historical Provider, MD  cetirizine (ZYRTEC) 10 MG tablet Take 10 mg by mouth daily.    Yes Historical Provider, MD  cholecalciferol (VITAMIN D) 1000 UNITS tablet Take 1,000 Units by mouth daily.   Yes Historical Provider, MD  donepezil (ARICEPT) 10 MG tablet Take 10 mg by mouth at bedtime.   Yes Historical Provider, MD  doxycycline (VIBRA-TABS) 100 MG tablet Take 100 mg by mouth 2 (two) times daily.   Yes Historical Provider, MD  fish oil-omega-3 fatty acids 1000 MG capsule Take 2 g by mouth every morning.    Yes Historical Provider, MD  furosemide (LASIX) 20 MG tablet Take 20 mg by mouth every other day.   Yes Historical Provider, MD  gabapentin (NEURONTIN) 300 MG capsule Take 300 mg by mouth 2 (two) times daily.    Yes Historical Provider, MD  Insulin Glargine (TOUJEO SOLOSTAR) 300 UNIT/ML SOPN Inject 15-30 Units into the skin at bedtime.    Yes Historical Provider, MD  lansoprazole (PREVACID) 30 MG capsule Take 30 mg by mouth daily as needed (For acid reflux.).    Yes Historical Provider, MD  levothyroxine (SYNTHROID, LEVOTHROID) 50 MCG tablet Take 50 mcg by mouth daily before breakfast.   Yes Historical Provider, MD  Meclizine HCl (ANTIVERT PO) Take 5 mg by mouth as needed (VERTIGO).   Yes Historical Provider, MD  simvastatin (ZOCOR) 40 MG tablet Take 40 mg by mouth every morning.    Yes Historical Provider, MD  sitaGLIPtin (JANUVIA) 25 MG tablet Take 25 mg by mouth daily with breakfast.    Yes Historical Provider, MD  sodium bicarbonate 650 MG tablet Take 1  tablet (650 mg total) by mouth 2 (two) times daily. 08/12/14  Yes Orson Eva, MD  tamsulosin (FLOMAX) 0.4 MG CAPS capsule Take 1 capsule (0.4 mg total) by mouth daily after supper. 08/12/14  Yes Orson Eva, MD  traMADol (ULTRAM) 50 MG tablet Take 50 mg by mouth every 6 (six) hours as needed. for pain 02/18/16  Yes Historical Provider, MD    Family History Family History  Problem Relation Age of Onset  . Parkinsonism Father   . Stomach cancer Mother   . Diabetes Mother   . Heart attack Neg Hx   . Hypertension Neg Hx     Social History Social History  Substance Use Topics  . Smoking status: Former Smoker    Years: 35.00    Types: Cigarettes    Quit date: 09/01/1995  . Smokeless tobacco: Not on file  . Alcohol use 0.0 oz/week  Comment: occasional scotch     Allergies   Amoxicillin; Metformin and related; and Ramipril   Review of Systems Review of Systems  Constitutional: Negative for chills and fever.  HENT: Negative for ear pain and sore throat.   Eyes: Negative for pain and visual disturbance.  Respiratory: Positive for cough, hemoptysis, sputum production and shortness of breath. Negative for wheezing.   Cardiovascular: Positive for orthopnea, leg swelling and PND. Negative for chest pain and palpitations.  Gastrointestinal: Negative for abdominal pain and vomiting.  Genitourinary: Negative for dysuria and hematuria.  Musculoskeletal: Negative for arthralgias and back pain.  Skin: Negative for color change and rash.  Neurological: Negative for seizures and syncope.  All other systems reviewed and are negative.    Physical Exam Updated Vital Signs BP 158/73   Pulse 70   Temp 97.8 F (36.6 C) (Oral)   Resp 20   SpO2 98%   Physical Exam  Constitutional: He appears well-developed and well-nourished.  HENT:  Head: Normocephalic and atraumatic.  Eyes: Conjunctivae are normal.  Neck: Neck supple.  Cardiovascular: Normal rate and regular rhythm.   No murmur  heard. Pulmonary/Chest: Effort normal. No respiratory distress. He has rales.  Abdominal: Soft. There is no tenderness.  Musculoskeletal: He exhibits edema.  Neurological: He is alert.  Skin: Skin is warm and dry.  Psychiatric: He has a normal mood and affect.  Nursing note and vitals reviewed.    ED Treatments / Results  Labs (all labs ordered are listed, but only abnormal results are displayed) Labs Reviewed  CBC WITH DIFFERENTIAL/PLATELET - Abnormal; Notable for the following:       Result Value   RBC 3.77 (*)    Hemoglobin 11.4 (*)    HCT 36.7 (*)    All other components within normal limits  BASIC METABOLIC PANEL - Abnormal; Notable for the following:    Potassium 5.7 (*)    Glucose, Bld 198 (*)    BUN 40 (*)    Creatinine, Ser 2.56 (*)    GFR calc non Af Amer 22 (*)    GFR calc Af Amer 26 (*)    All other components within normal limits  BRAIN NATRIURETIC PEPTIDE - Abnormal; Notable for the following:    B Natriuretic Peptide 521.1 (*)    All other components within normal limits  I-STAT TROPOININ, ED    EKG  EKG Interpretation  Date/Time:  Friday April 03 2016 21:44:12 EDT Ventricular Rate:  67 PR Interval:    QRS Duration: 142 QT Interval:  451 QTC Calculation: 477 R Axis:   -53 Text Interpretation:  Atrial flutter with predominant 4:1 AV block Ventricular premature complex Nonspecific IVCD with LAD LVH with secondary repolarization abnormality Anterior infarct, old No significant change since last tracing Confirmed by KNOTT MD, DANIEL [phone]) on 04/03/2016 10:02:45 PM       Radiology Dg Chest 2 View  Result Date: 04/03/2016 CLINICAL DATA:  Shortness of breath for a couple of weeks, cough and congestion. EXAM: CHEST  2 VIEW COMPARISON:  Chest x-rays dated 12/10/2015 and 08/08/2014. FINDINGS: Mild cardiomegaly is stable. Overall cardiomediastinal silhouette appears stable. Left chest wall pacemaker/ICD in place. Median sternotomy wires stable in position.  There is central pulmonary vascular congestion mild interstitial edema. Suspect small bilateral pleural effusions. No confluent airspace opacity to suggest a developing pneumonia. Osseous structures about the chest are unremarkable. IMPRESSION: 1. Cardiomegaly with central pulmonary vascular congestion and mild bilateral interstitial edema suggesting CHF/volume overload. 2. Probable small  pleural effusions. 3. No evidence of pneumonia seen. Electronically Signed   By: Franki Cabot M.D.   On: 04/03/2016 22:05    Procedures Procedures (including critical care time)  Medications Ordered in ED Medications  furosemide (LASIX) injection 80 mg (80 mg Intravenous Given 04/03/16 2234)     Initial Impression / Assessment and Plan / ED Course  I have reviewed the triage vital signs and the nursing notes.  Pertinent labs & imaging results that were available during my care of the patient were reviewed by me and considered in my medical decision making (see chart for details).  Clinical Course    Patient is an 82 year old male with history of CHF who presents emergency department for evaluation of shortness of breath, orthopnea, PND worsening over the past 2 days. Patient's being treated with doxycycline for presumed pneumonia as well. Patient endorses cough productive of sputum.  She arrives via EMS on 4 L oxygen satting 95 percent. Patient denies any chest pain. She has 1+ pitting edema and rales on lung exam.  Concern for CHF. No chest pain to suggest ACS or pulmonary embolism.  EKG performed and reviewed shows a rate of 67, no acute ischemic changes or T-wave inversions.  Chest x-ray performed which shows cardiomegaly with pulmonary vascular congestion and interstitial edema. First troponin negative, CBC, BMP near baseline.  X-ray consistent with history of physical exam findings of CHF exacerbation. 80 mg IV Lasix ordered and given the patient.  Discussed case with hospitalist who will admit  patient.  Discussed case with my attending, Dr. Laneta Simmers.    Final Clinical Impressions(s) / ED Diagnoses   Final diagnoses:  Congestive heart failure, unspecified congestive heart failure chronicity, unspecified congestive heart failure type [facility])    New Prescriptions New Prescriptions   No medications on file     Vira Blanco, MD 04/03/16 2324    Leo Grosser, MD 04/04/16 [phone]

## 2016-04-04 ENCOUNTER — Encounter (HOSPITAL_COMMUNITY): Payer: Self-pay

## 2016-04-04 ENCOUNTER — Inpatient Hospital Stay (HOSPITAL_COMMUNITY): Payer: Medicare Other

## 2016-04-04 DIAGNOSIS — F0390 Unspecified dementia without behavioral disturbance: Secondary | ICD-10-CM | POA: Diagnosis present

## 2016-04-04 DIAGNOSIS — E1121 Type 2 diabetes mellitus with diabetic nephropathy: Secondary | ICD-10-CM | POA: Diagnosis present

## 2016-04-04 DIAGNOSIS — R0602 Shortness of breath: Secondary | ICD-10-CM | POA: Diagnosis not present

## 2016-04-04 DIAGNOSIS — D631 Anemia in chronic kidney disease: Secondary | ICD-10-CM | POA: Diagnosis present

## 2016-04-04 DIAGNOSIS — E1142 Type 2 diabetes mellitus with diabetic polyneuropathy: Secondary | ICD-10-CM | POA: Diagnosis present

## 2016-04-04 DIAGNOSIS — I509 Heart failure, unspecified: Secondary | ICD-10-CM

## 2016-04-04 DIAGNOSIS — E1122 Type 2 diabetes mellitus with diabetic chronic kidney disease: Secondary | ICD-10-CM | POA: Diagnosis present

## 2016-04-04 DIAGNOSIS — H8109 Meniere's disease, unspecified ear: Secondary | ICD-10-CM | POA: Diagnosis present

## 2016-04-04 DIAGNOSIS — M1990 Unspecified osteoarthritis, unspecified site: Secondary | ICD-10-CM | POA: Diagnosis present

## 2016-04-04 DIAGNOSIS — I5023 Acute on chronic systolic (congestive) heart failure: Secondary | ICD-10-CM | POA: Diagnosis not present

## 2016-04-04 DIAGNOSIS — I4891 Unspecified atrial fibrillation: Secondary | ICD-10-CM

## 2016-04-04 DIAGNOSIS — J438 Other emphysema: Secondary | ICD-10-CM | POA: Diagnosis not present

## 2016-04-04 DIAGNOSIS — H9190 Unspecified hearing loss, unspecified ear: Secondary | ICD-10-CM | POA: Diagnosis present

## 2016-04-04 DIAGNOSIS — N184 Chronic kidney disease, stage 4 (severe): Secondary | ICD-10-CM | POA: Diagnosis not present

## 2016-04-04 DIAGNOSIS — E039 Hypothyroidism, unspecified: Secondary | ICD-10-CM

## 2016-04-04 DIAGNOSIS — Z951 Presence of aortocoronary bypass graft: Secondary | ICD-10-CM | POA: Diagnosis not present

## 2016-04-04 DIAGNOSIS — K219 Gastro-esophageal reflux disease without esophagitis: Secondary | ICD-10-CM | POA: Diagnosis present

## 2016-04-04 DIAGNOSIS — Z7982 Long term (current) use of aspirin: Secondary | ICD-10-CM | POA: Diagnosis not present

## 2016-04-04 DIAGNOSIS — I252 Old myocardial infarction: Secondary | ICD-10-CM | POA: Diagnosis not present

## 2016-04-04 DIAGNOSIS — Z794 Long term (current) use of insulin: Secondary | ICD-10-CM

## 2016-04-04 DIAGNOSIS — I481 Persistent atrial fibrillation: Secondary | ICD-10-CM | POA: Diagnosis not present

## 2016-04-04 DIAGNOSIS — Z87891 Personal history of nicotine dependence: Secondary | ICD-10-CM | POA: Diagnosis not present

## 2016-04-04 DIAGNOSIS — E1321 Other specified diabetes mellitus with diabetic nephropathy: Secondary | ICD-10-CM

## 2016-04-04 DIAGNOSIS — E1165 Type 2 diabetes mellitus with hyperglycemia: Secondary | ICD-10-CM | POA: Diagnosis present

## 2016-04-04 DIAGNOSIS — I130 Hypertensive heart and chronic kidney disease with heart failure and stage 1 through stage 4 chronic kidney disease, or unspecified chronic kidney disease: Secondary | ICD-10-CM | POA: Diagnosis present

## 2016-04-04 DIAGNOSIS — E1365 Other specified diabetes mellitus with hyperglycemia: Secondary | ICD-10-CM

## 2016-04-04 DIAGNOSIS — I255 Ischemic cardiomyopathy: Secondary | ICD-10-CM | POA: Diagnosis present

## 2016-04-04 DIAGNOSIS — I480 Paroxysmal atrial fibrillation: Secondary | ICD-10-CM

## 2016-04-04 DIAGNOSIS — E875 Hyperkalemia: Secondary | ICD-10-CM | POA: Diagnosis present

## 2016-04-04 DIAGNOSIS — I2510 Atherosclerotic heart disease of native coronary artery without angina pectoris: Secondary | ICD-10-CM | POA: Diagnosis present

## 2016-04-04 DIAGNOSIS — Z95810 Presence of automatic (implantable) cardiac defibrillator: Secondary | ICD-10-CM | POA: Diagnosis not present

## 2016-04-04 DIAGNOSIS — E785 Hyperlipidemia, unspecified: Secondary | ICD-10-CM | POA: Diagnosis present

## 2016-04-04 DIAGNOSIS — Z96642 Presence of left artificial hip joint: Secondary | ICD-10-CM | POA: Diagnosis present

## 2016-04-04 DIAGNOSIS — I10 Essential (primary) hypertension: Secondary | ICD-10-CM | POA: Diagnosis not present

## 2016-04-04 LAB — BASIC METABOLIC PANEL
Anion gap: 9 (ref 5–15)
BUN: 38 mg/dL — AB (ref 6–20)
CHLORIDE: 107 mmol/L (ref 101–111)
CO2: 24 mmol/L (ref 22–32)
Calcium: 9.1 mg/dL (ref 8.9–10.3)
Creatinine, Ser: 2.43 mg/dL — ABNORMAL HIGH (ref 0.61–1.24)
GFR calc non Af Amer: 24 mL/min — ABNORMAL LOW (ref >60)
GFR, EST AFRICAN AMERICAN: 27 mL/min — AB (ref >60)
Glucose, Bld: 125 mg/dL — ABNORMAL HIGH (ref 65–99)
POTASSIUM: 4.2 mmol/L (ref 3.5–5.1)
SODIUM: 140 mmol/L (ref 135–145)

## 2016-04-04 LAB — GLUCOSE, CAPILLARY
GLUCOSE-CAPILLARY: 118 mg/dL — AB (ref 65–99)
GLUCOSE-CAPILLARY: 153 mg/dL — AB (ref 65–99)
GLUCOSE-CAPILLARY: 185 mg/dL — AB (ref 65–99)
GLUCOSE-CAPILLARY: 187 mg/dL — AB (ref 65–99)
Glucose-Capillary: 151 mg/dL — ABNORMAL HIGH (ref 65–99)

## 2016-04-04 LAB — CBC
HEMATOCRIT: 33.9 % — AB (ref 39.0–52.0)
Hemoglobin: 10.7 g/dL — ABNORMAL LOW (ref 13.0–17.0)
MCH: 30.6 pg (ref 26.0–34.0)
MCHC: 31.6 g/dL (ref 30.0–36.0)
MCV: 96.9 fL (ref 78.0–100.0)
PLATELETS: 145 10*3/uL — AB (ref 150–400)
RBC: 3.5 MIL/uL — ABNORMAL LOW (ref 4.22–5.81)
RDW: 14.4 % (ref 11.5–15.5)
WBC: 6.6 10*3/uL (ref 4.0–10.5)

## 2016-04-04 LAB — ECHOCARDIOGRAM COMPLETE
HEIGHTINCHES: 71 in
WEIGHTICAEL: 2830.4 [oz_av]

## 2016-04-04 LAB — MRSA PCR SCREENING: MRSA by PCR: NEGATIVE

## 2016-04-04 LAB — MAGNESIUM: MAGNESIUM: 2 mg/dL (ref 1.7–2.4)

## 2016-04-04 LAB — TSH: TSH: 3.677 u[IU]/mL (ref 0.350–4.500)

## 2016-04-04 MED ORDER — INSULIN GLARGINE 100 UNIT/ML ~~LOC~~ SOLN
15.0000 [IU] | Freq: Every day | SUBCUTANEOUS | Status: DC
  Administered 2016-04-04: 15 [IU] via SUBCUTANEOUS
  Filled 2016-04-04 (×3): qty 0.15

## 2016-04-04 MED ORDER — DONEPEZIL HCL 10 MG PO TABS
10.0000 mg | ORAL_TABLET | Freq: Every day | ORAL | Status: DC
Start: 2016-04-04 — End: 2016-04-05
  Administered 2016-04-04: 10 mg via ORAL
  Filled 2016-04-04: qty 1

## 2016-04-04 MED ORDER — INSULIN ASPART 100 UNIT/ML ~~LOC~~ SOLN: 0.0000 [IU] | Freq: Every day | SUBCUTANEOUS | Status: DC

## 2016-04-04 MED ORDER — ACETAMINOPHEN 325 MG PO TABS: 650.0000 mg | ORAL_TABLET | Freq: Four times a day (QID) | ORAL | Status: DC | PRN

## 2016-04-04 MED ORDER — TAMSULOSIN HCL 0.4 MG PO CAPS
0.4000 mg | ORAL_CAPSULE | Freq: Every day | ORAL | Status: DC
  Administered 2016-04-04: 0.4 mg via ORAL
  Filled 2016-04-04: qty 1

## 2016-04-04 MED ORDER — ENOXAPARIN SODIUM 30 MG/0.3ML ~~LOC~~ SOLN
30.0000 mg | Freq: Every day | SUBCUTANEOUS | Status: DC
  Administered 2016-04-04: 30 mg via SUBCUTANEOUS
  Filled 2016-04-04: qty 0.3

## 2016-04-04 MED ORDER — RIVAROXABAN 15 MG PO TABS
15.0000 mg | ORAL_TABLET | Freq: Every day | ORAL | Status: DC
  Administered 2016-04-04: 15 mg via ORAL
  Filled 2016-04-04: qty 1

## 2016-04-04 MED ORDER — ACETAMINOPHEN 650 MG RE SUPP: 650.0000 mg | Freq: Four times a day (QID) | RECTAL | Status: DC | PRN

## 2016-04-04 MED ORDER — FUROSEMIDE 20 MG PO TABS
20.0000 mg | ORAL_TABLET | Freq: Every day | ORAL | Status: DC
  Administered 2016-04-05: 20 mg via ORAL
  Filled 2016-04-04: qty 1

## 2016-04-04 MED ORDER — PANTOPRAZOLE SODIUM 20 MG PO TBEC
20.0000 mg | DELAYED_RELEASE_TABLET | Freq: Every day | ORAL | Status: DC
  Administered 2016-04-04 – 2016-04-05 (×2): 20 mg via ORAL
  Filled 2016-04-04 (×2): qty 1

## 2016-04-04 MED ORDER — LEVOTHYROXINE SODIUM 50 MCG PO TABS
50.0000 ug | ORAL_TABLET | Freq: Every day | ORAL | Status: DC
  Administered 2016-04-04 – 2016-04-05 (×2): 50 ug via ORAL
  Filled 2016-04-04 (×2): qty 1

## 2016-04-04 MED ORDER — CALCITRIOL 0.25 MCG PO CAPS
0.2500 ug | ORAL_CAPSULE | Freq: Every day | ORAL | Status: DC
  Administered 2016-04-04 – 2016-04-05 (×2): 0.25 ug via ORAL
  Filled 2016-04-04 (×2): qty 1

## 2016-04-04 MED ORDER — SODIUM BICARBONATE 650 MG PO TABS
650.0000 mg | ORAL_TABLET | Freq: Two times a day (BID) | ORAL | Status: DC
  Administered 2016-04-04 – 2016-04-05 (×3): 650 mg via ORAL
  Filled 2016-04-04 (×3): qty 1

## 2016-04-04 MED ORDER — SIMVASTATIN 40 MG PO TABS
40.0000 mg | ORAL_TABLET | Freq: Every day | ORAL | Status: DC
  Administered 2016-04-04 – 2016-04-05 (×2): 40 mg via ORAL
  Filled 2016-04-04 (×2): qty 1

## 2016-04-04 MED ORDER — GABAPENTIN 300 MG PO CAPS
300.0000 mg | ORAL_CAPSULE | Freq: Two times a day (BID) | ORAL | Status: DC
Start: 2016-04-04 — End: 2016-04-05
  Administered 2016-04-04 – 2016-04-05 (×3): 300 mg via ORAL
  Filled 2016-04-04 (×3): qty 1

## 2016-04-04 MED ORDER — FUROSEMIDE 10 MG/ML IJ SOLN
80.0000 mg | Freq: Every day | INTRAMUSCULAR | Status: DC
  Administered 2016-04-04: 80 mg via INTRAVENOUS
  Filled 2016-04-04: qty 8

## 2016-04-04 MED ORDER — INSULIN ASPART 100 UNIT/ML ~~LOC~~ SOLN
0.0000 [IU] | Freq: Three times a day (TID) | SUBCUTANEOUS | Status: DC
  Administered 2016-04-04 (×2): 2 [IU] via SUBCUTANEOUS
  Administered 2016-04-05: 1 [IU] via SUBCUTANEOUS

## 2016-04-04 MED ORDER — ASPIRIN EC 81 MG PO TBEC
81.0000 mg | DELAYED_RELEASE_TABLET | Freq: Every day | ORAL | Status: DC
  Administered 2016-04-04: 81 mg via ORAL
  Filled 2016-04-04: qty 1

## 2016-04-04 MED ORDER — TRAMADOL HCL 50 MG PO TABS: 50.0000 mg | ORAL_TABLET | Freq: Four times a day (QID) | ORAL | Status: DC | PRN

## 2016-04-04 MED ORDER — PERFLUTREN LIPID MICROSPHERE
1.0000 mL | INTRAVENOUS | Status: AC | PRN
  Administered 2016-04-04: 2 mL via INTRAVENOUS
  Filled 2016-04-04: qty 10

## 2016-04-04 NOTE — Progress Notes (Signed)
[facility] on call notifed that CMT called and stated that pt ICD has been firing when HR decreased. Pt was asymptomatic and bp charted in EPIC. New Orders EKG will continue to monitor. Arthor Captain LPN

## 2016-04-04 NOTE — Progress Notes (Signed)
Paged by RN to evaluate patient because the central telemetry monitor was concerned that the patient's ICD was firing.  The patient has had intermittent bradycardia while sleeping tonight.  He has been asymptomatic.  Telemetry strips reviewed and intermittent pacer spikes were seen.  I do not believe that the patient has been shocked.  Device interrogation has been requested through [place] on-call rep.  Electrolytes are pending.  12 lead EKG is stable.  He is not on any AV nodal blocking agents at this time.  Continue to monitor.

## 2016-04-04 NOTE — Consult Note (Signed)
Admit date: 04/03/2016 Referring Physician  Dr. Carles Collet Primary Physician Orpah Melter, MD Primary Cardiologist  Dr. Irish Lack Reason for Consultation  clarification on prior diagnosis of atrial fibrillation  HPI: 82 year old with chronic systolic heart failure, ischemic heart myopathy, CABG 1997, ICD, diabetes, chronic kidney disease stage IV here with acute on chronic systolic heart failure exacerbation after 2-3 weeks history of worsening shortness of breath. No chest pain. Lower extremity ankle edema noted. Very challenging to walk to the mailbox, class III.  In review of Dr. Hassell Done note from 02/27/16 it states that he was found to have an irregular heartbeat by his primary medical doctor in early of 2014 which revealed atrial fibrillation. Current EKG from 04/04/16 at 4:46 AM does demonstrate atrial fibrillation with normal rate. EKGs prior to this in epic demonstrate sinus rhythm. I do not see any ICD checks that demonstrated atrial fibrillation when reviewing notes from Dr. Jackalyn Lombard visits.  In the emergency department, chest x-ray revealed pulmonary edema, BNP 521. Previously prescribed furosemide 20 mV 3 times per week was decreased to 2 times per week by nephrology given his chronic kidney disease.  After diuresis with IV Lexiscan, he is feeling much better. Resting comfortably with no orthopnea. No significant shortness of breath. No prior stroke history.      PMH:   Past Medical History:  Diagnosis Date  . AICD (automatic cardioverter/defibrillator) present   . Allergic rhinitis   . Anemia   . Arthritis   . CHF (congestive heart failure) ([place])   . Chronic kidney disease   . COPD, mild ([place])    dr clance  . Coronary artery disease    s/p CABG 1997  . Dementia   . Diabetes mellitus    dr Olen Pel- Type II  . Diverticulitis, colon   . GERD (gastroesophageal reflux disease)   . History of blood transfusion   . HOH (hard of hearing)   . Hyperlipidemia   . Hyperplastic  colon polyp   . Hypertension   . Hypothyroidism   . Ischemic cardiomyopathy    sp ICD (SJM) implanted by Dr Rayann Heman  . Meniere's syndrome   . MI (myocardial infarction) ([place]) 1997, 2005  . Presence of permanent cardiac pacemaker   . Self-catheterizes urinary bladder    bid-   . Tremor, essential    dr Jannifer Franklin- bengin     PSH:   Past Surgical History:  Procedure Laterality Date  . AV FISTULA PLACEMENT Right 05/01/2015   Procedure: RIGHT RADIOCEPHALIC ARTERIOVENOUS (AV) FISTULA CREATION;  Surgeon: Elam Dutch, MD;  Location: [facility];  Service: Vascular;  Laterality: Right;  . CARDIAC CATHETERIZATION  2005   medical therapy  . CARDIAC DEFIBRILLATOR PLACEMENT     ICD implanted by Dr Rayann Heman, Analyze ST study patient  . COLONOSCOPY N/A 08/25/2014   Procedure: COLONOSCOPY;  Surgeon: Lear Ng, MD;  [facility];  Service: Endoscopy;  Laterality: N/A;  . CORONARY ARTERY BYPASS GRAFT  1997   [place]  . ESOPHAGOGASTRODUODENOSCOPY N/A 08/25/2014   Procedure: ESOPHAGOGASTRODUODENOSCOPY (EGD);  Surgeon: Lear Ng, MD;  Location: [facility];  Service: Endoscopy;  Laterality: N/A;  . EYE SURGERY Bilateral    cartaract  . JOINT REPLACEMENT Right 2004  . TOTAL HIP ARTHROPLASTY Left 01/21/2016   Procedure: LEFT TOTAL HIP ARTHROPLASTY ANTERIOR APPROACH;  Surgeon: Paralee Cancel, MD;  [facility];  Service: Orthopedics;  Laterality: Left;   Allergies:  Amoxicillin; Metformin and related; and Ramipril Prior to Admit Meds:  Prior to Admission medications   Medication Sig Start Date End Date Taking? Authorizing Provider  acetaminophen (TYLENOL 8 HOUR ARTHRITIS PAIN) 650 MG CR tablet Take 650 mg by mouth every 8 (eight) hours as needed for pain.   Yes Historical Provider, MD  aspirin 81 MG tablet Take 81 mg by mouth daily.   Yes Historical Provider, MD  bimatoprost (LUMIGAN) 0.03 % ophthalmic solution Place 1 drop into both eyes at bedtime.    Yes Historical Provider, MD   calcitRIOL (ROCALTROL) 0.25 MCG capsule Take 0.25 mcg by mouth daily.   Yes Historical Provider, MD  cetirizine (ZYRTEC) 10 MG tablet Take 10 mg by mouth daily.    Yes Historical Provider, MD  cholecalciferol (VITAMIN D) 1000 UNITS tablet Take 1,000 Units by mouth daily.   Yes Historical Provider, MD  donepezil (ARICEPT) 10 MG tablet Take 10 mg by mouth at bedtime.   Yes Historical Provider, MD  fish oil-omega-3 fatty acids 1000 MG capsule Take 2 g by mouth every morning.    Yes Historical Provider, MD  furosemide (LASIX) 20 MG tablet Take 20 mg by mouth every other day.   Yes Historical Provider, MD  gabapentin (NEURONTIN) 300 MG capsule Take 300 mg by mouth 2 (two) times daily.    Yes Historical Provider, MD  Insulin Glargine (TOUJEO SOLOSTAR) 300 UNIT/ML SOPN Inject 15-30 Units into the skin at bedtime.    Yes Historical Provider, MD  lansoprazole (PREVACID) 30 MG capsule Take 30 mg by mouth daily as needed (For acid reflux.).    Yes Historical Provider, MD  levothyroxine (SYNTHROID, LEVOTHROID) 50 MCG tablet Take 50 mcg by mouth daily before breakfast.   Yes Historical Provider, MD  Meclizine HCl (ANTIVERT PO) Take 5 mg by mouth as needed (VERTIGO).   Yes Historical Provider, MD  simvastatin (ZOCOR) 40 MG tablet Take 40 mg by mouth every morning.    Yes Historical Provider, MD  sitaGLIPtin (JANUVIA) 25 MG tablet Take 25 mg by mouth daily with breakfast.    Yes Historical Provider, MD  sodium bicarbonate 650 MG tablet Take 1 tablet (650 mg total) by mouth 2 (two) times daily. 08/12/14  Yes Orson Eva, MD  tamsulosin (FLOMAX) 0.4 MG CAPS capsule Take 1 capsule (0.4 mg total) by mouth daily after supper. 08/12/14  Yes Orson Eva, MD  traMADol (ULTRAM) 50 MG tablet Take 50 mg by mouth every 6 (six) hours as needed. for pain 02/18/16  Yes Historical Provider, MD   Fam HX:    Family History  Problem Relation Age of Onset  . Parkinsonism Father   . Stomach cancer Mother   . Diabetes Mother   .  Heart attack Neg Hx   . Hypertension Neg Hx    Social HX:    Social History   Social History  . Marital status: Married    Spouse name: N/A  . Number of children: N/A  . Years of education: N/A   Occupational History  . retired Retired   Social History Main Topics  . Smoking status: Former Smoker    Years: 35.00    Types: Cigarettes    Quit date: 09/01/1995  . Smokeless tobacco: Not on file  . Alcohol use 0.0 oz/week     Comment: occasional scotch  . Drug use: No  . Sexual activity: Not on file   Other Topics Concern  . Not on file   Social History Narrative   Pt lives in [place] with wife.  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 rate 20, height 5\' 11"  (1.803 m), weight 176 lb 14.4 oz (80.2 kg), SpO2 100 %.   General: Well developed, well nourished, in no acute distress Head: Eyes PERRLA, No xanthomas.   Normal cephalic and atramatic  Lungs:   Clear bilaterally to auscultation and percussion. Normal respiratory effort. No wheezes, no rales. Heart:   Irregularly irregular, normal rate S1 S2 Pulses are 2+ & equal. No murmur, rubs, gallops.  No carotid bruit. No JVD.  No abdominal bruits.  Abdomen: Bowel sounds are positive, abdomen soft and non-tender without masses. No hepatosplenomegaly. Msk:  Back normal. Normal strength and tone for age. Extremities:  No clubbing, cyanosis, Right forearm fistula with thrill No lower extremity edema.  DP +1 Neuro: Alert and oriented X 3, non-focal, MAE x 4 GU: Deferred Rectal: Deferred Psych:  Good affect, responds appropriately      Labs: Lab Results  Component Value Date   WBC 6.6 04/04/2016   HGB 10.7 (L) 04/04/2016   HCT 33.9 (L) 04/04/2016   MCV 96.9 04/04/2016   PLT 145 (L) 04/04/2016     Recent Labs Lab  04/04/16 0648  NA 140  K 4.2  CL 107  CO2 24  BUN 38*  CREATININE 2.43*  CALCIUM 9.1  GLUCOSE 125*   No results for input(s): CKTOTAL, CKMB, TROPONINI in the last 72 hours. No results found for: CHOL, HDL, LDLCALC, TRIG No results found for: DDIMER   Radiology:  Dg Chest 2 View  Result Date: 04/03/2016 CLINICAL DATA:  Shortness of breath for a couple of weeks, cough and congestion. EXAM: CHEST  2 VIEW COMPARISON:  Chest x-rays dated 12/10/2015 and 08/08/2014. FINDINGS: Mild cardiomegaly is stable. Overall cardiomediastinal silhouette appears stable. Left chest wall pacemaker/ICD in place. Median sternotomy wires stable in position. There is central pulmonary vascular congestion mild interstitial edema. Suspect small bilateral pleural effusions. No confluent airspace opacity to suggest a developing pneumonia. Osseous structures about the chest are unremarkable. IMPRESSION: 1. Cardiomegaly with central pulmonary vascular congestion and mild bilateral interstitial edema suggesting CHF/volume overload. 2. Probable small pleural effusions. 3. No evidence of pneumonia seen. Electronically Signed   By: Franki Cabot M.D.   On: 04/03/2016 22:05   Personally viewed.  EKG:  Atrial fibrillation very well rate controlled Personally viewed.   ASSESSMENT/PLAN:    Acute on chronic systolic heart failure  - EF 25-30%  - IV Lasix  - Negative for proximally 1 L  - Current weight 176 pounds  - I'm fine with him converting over to by mouth Lasix tomorrow as per plan. Discussed with Dr. Carles Collet.  Paroxysmal atrial fibrillation  -  medical record was reviewed by Dr. Carles Collet, Dr. Irish Lack mentioned on 02/26/16 that he did have a history of atrial fibrillation from early 2014 when he had palpitations and his primary medical doctor check an EKG which showed atrial fibrillation. In subsequent review of notes in epic from both Dr. Irish Lack as well as Dr. Rayann Heman and ICD interrogations, there does not appear to be any  evidence of atrial fibrillation up until now. Clearly his EKG currently demonstrates atrial fibrillation. With his cardiomyopathy, age, he is prone to development of atrial fibrillation. Family members are concerned about this stating  that they were not aware. Request cardiology evaluation for further clarification.  - CHADS-VASC - at least 5, warrants anticoagulation  - Recommend Xarelto 15 mg once a day given his creatinine clearance of 25.9 mL/m. There is a risk of bleeding. Stop aspirin.  - Dr. Lorrene Reid would rather him be on Coumadin, we are fine with this.  Coronary artery disease/ischemic cardiomyopathy  - ICD in place  - Normal function, no discharges  - No anginal symptoms  Electrolyte abnormalities/hyperkalemia  - Improved with IV Lasix  Chronic kidney disease stage IV  - Baseline creatinine between 2.5 and 2.8  Diabetes mellitus with renal manifestations  - Per primary team, hemoglobin A1c 7.6  Memory impairment  - Aricept  COPD mild  Candee Furbish, MD  04/04/2016  2:55 PM

## 2016-04-04 NOTE — Progress Notes (Signed)
  Echocardiogram 2D Echocardiogram has been performed with definity.  Aggie Cosier 04/04/2016, 9:47 AM

## 2016-04-04 NOTE — H&P (Signed)
History and Physical  Patient Name: Patrick Boyle     [id number]    DOB: [birth year]    DOA: 04/03/2016 PCP: Orpah Melter, MD  Outpatient specialists:  Irish Lack, Cardiology     Allred, EP     Lorrene Reid, Nephrology   Patient coming from: Home  Chief Complaint: Dyspnea on exertion  HPI: Patrick Boyle is a 82 y.o. male with a past medical history significant for chronic systolic CHF with ICD, IDDM, COPD not on home O2, anemia, CKD 4, hypothyroidism who presents with dyspnea on exertion.  The patient first noticed increasing dizziness and dyspnea on exertion several weeks ago.  This was accompanied by dyspnea with lying flat, and waking at night with trouble breathing as well as some mild ankle swelling.  He can normally walk partway down the street, but this week, his daughter notes he has to stop halfway to the mailbox (30 yards away).  This week he was seen for  a productive cough, and was started on doxycycline for bronchitis or pneumonia.  He had no interval improvement when he saw his PCP today so he was sent to the ER for work up for CHF.  ED course: -Afebrile, heart rate 69, respirations 20-24, hypertensive, requiring supplemental oxygen (which he does not at home) -Na 138, K 5.7, Cr 2.56 (baseline 2.5, previously baseline last year was mid-3s), WBC 6.7K, Hgb 11.4 and normocytic and stable -BNP 520 pg/mL, troponin negative -CXR showed mild congestion -ECG showed recurrent Aflutter -He was given furosemide 80 mg IV and TRH were asked to evaluate for admission for CHF       Review of Systems:  Review of Systems  Constitutional: Negative for chills, diaphoresis, fever and malaise/fatigue.  HENT: Negative.   Eyes: Negative.   Respiratory: Positive for cough and shortness of breath. Negative for hemoptysis, sputum production and wheezing.   Cardiovascular: Positive for orthopnea, leg swelling and PND. Negative for chest pain and palpitations.  Gastrointestinal: Negative.     Genitourinary: Negative.   Musculoskeletal: Negative.   Skin: Negative.   Neurological: Negative.  Negative for weakness.  Endo/Heme/Allergies: Negative.   Psychiatric/Behavioral: Negative.     Past Medical History:  Diagnosis Date  . AICD (automatic cardioverter/defibrillator) present   . Allergic rhinitis   . Anemia   . Arthritis   . CHF (congestive heart failure) ([place])   . Chronic kidney disease   . COPD, mild ([place])    dr clance  . Coronary artery disease    s/p CABG 1997  . Dementia   . Diabetes mellitus    dr Olen Pel- Type II  . Diverticulitis, colon   . GERD (gastroesophageal reflux disease)   . History of blood transfusion   . HOH (hard of hearing)   . Hyperlipidemia   . Hyperplastic colon polyp   . Hypertension   . Hypothyroidism   . Ischemic cardiomyopathy    sp ICD (SJM) implanted by Dr Rayann Heman  . Meniere's syndrome   . MI (myocardial infarction) ([place]) 1997, 2005  . Presence of permanent cardiac pacemaker   . Self-catheterizes urinary bladder    bid-   . Tremor, essential    dr Jannifer Franklin- bengin     Past Surgical History:  Procedure Laterality Date  . AV FISTULA PLACEMENT Right 05/01/2015   Procedure: RIGHT RADIOCEPHALIC ARTERIOVENOUS (AV) FISTULA CREATION;  Surgeon: Elam Dutch, MD;  Location: [facility];  Service: Vascular;  Laterality: Right;  . CARDIAC CATHETERIZATION  2005   medical therapy  .  CARDIAC DEFIBRILLATOR PLACEMENT     ICD implanted by Dr Rayann Heman, Analyze ST study patient  . COLONOSCOPY N/A 08/25/2014   Procedure: COLONOSCOPY;  Surgeon: Lear Ng, MD;  Location: [facility];  Service: Endoscopy;  Laterality: N/A;  . CORONARY ARTERY BYPASS GRAFT  1997   [place]  . ESOPHAGOGASTRODUODENOSCOPY N/A 08/25/2014   Procedure: ESOPHAGOGASTRODUODENOSCOPY (EGD);  Surgeon: Lear Ng, MD;  Location: [facility];  Service: Endoscopy;  Laterality: N/A;  . EYE SURGERY Bilateral    cartaract  . JOINT REPLACEMENT Right 2004  . TOTAL HIP  ARTHROPLASTY Left 01/21/2016   Procedure: LEFT TOTAL HIP ARTHROPLASTY ANTERIOR APPROACH;  Surgeon: Paralee Cancel, MD;  Location: [facility];  Service: Orthopedics;  Laterality: Left;    Social History: Patient lives with his wife and dachshund.  Patient walks unassisted.  Mild dementia.  Remote former smoker.  Worked for [employer] in [employer], [employer] as a Freight forwarder.     Allergies  Allergen Reactions  . Amoxicillin Other (See Comments)    Vomiting and shaking Has patient had a PCN reaction causing immediate rash, facial/tongue/throat swelling, SOB or lightheadedness with hypotension: no Has patient had a PCN reaction causing severe rash involving mucus membranes or skin necrosis: no Has patient had a PCN reaction that required hospitalization: occurred twice and second time was hospitalized for this and other symptoms Has patient had a PCN reaction occurring within the last 10 years: yes If all of the above answers are "NO", then ma  . Metformin And Related Other (See Comments)    GI sensitivity with high doses  . Ramipril Cough    Family history: family history includes Diabetes in his mother; Parkinsonism in his father; Stomach cancer in his mother.  Prior to Admission medications   Medication Sig Start Date End Date Taking? Authorizing Provider  acetaminophen (TYLENOL 8 HOUR ARTHRITIS PAIN) 650 MG CR tablet Take 650 mg by mouth every 8 (eight) hours as needed for pain.   Yes Historical Provider, MD  aspirin 81 MG tablet Take 81 mg by mouth daily.   Yes Historical Provider, MD  bimatoprost (LUMIGAN) 0.03 % ophthalmic solution Place 1 drop into both eyes at bedtime.    Yes Historical Provider, MD  calcitRIOL (ROCALTROL) 0.25 MCG capsule Take 0.25 mcg by mouth daily.   Yes Historical Provider, MD  cetirizine (ZYRTEC) 10 MG tablet Take 10 mg by mouth daily.    Yes Historical Provider, MD  cholecalciferol (VITAMIN D) 1000 UNITS tablet Take 1,000 Units by mouth daily.   Yes Historical Provider, MD    donepezil (ARICEPT) 10 MG tablet Take 10 mg by mouth at bedtime.   Yes Historical Provider, MD  doxycycline (VIBRA-TABS) 100 MG tablet Take 100 mg by mouth 2 (two) times daily.   Yes Historical Provider, MD  fish oil-omega-3 fatty acids 1000 MG capsule Take 2 g by mouth every morning.    Yes Historical Provider, MD  furosemide (LASIX) 20 MG tablet Take 20 mg by mouth every other day.   Yes Historical Provider, MD  gabapentin (NEURONTIN) 300 MG capsule Take 300 mg by mouth 2 (two) times daily.    Yes Historical Provider, MD  Insulin Glargine (TOUJEO SOLOSTAR) 300 UNIT/ML SOPN Inject 15-30 Units into the skin at bedtime.    Yes Historical Provider, MD  lansoprazole (PREVACID) 30 MG capsule Take 30 mg by mouth daily as needed (For acid reflux.).    Yes Historical Provider, MD  levothyroxine (SYNTHROID, LEVOTHROID) 50 MCG tablet Take 50 mcg  by mouth daily before breakfast.   Yes Historical Provider, MD  Meclizine HCl (ANTIVERT PO) Take 5 mg by mouth as needed (VERTIGO).   Yes Historical Provider, MD  simvastatin (ZOCOR) 40 MG tablet Take 40 mg by mouth every morning.    Yes Historical Provider, MD  sitaGLIPtin (JANUVIA) 25 MG tablet Take 25 mg by mouth daily with breakfast.    Yes Historical Provider, MD  sodium bicarbonate 650 MG tablet Take 1 tablet (650 mg total) by mouth 2 (two) times daily. 08/12/14  Yes Orson Eva, MD  tamsulosin (FLOMAX) 0.4 MG CAPS capsule Take 1 capsule (0.4 mg total) by mouth daily after supper. 08/12/14  Yes Orson Eva, MD  traMADol (ULTRAM) 50 MG tablet Take 50 mg by mouth every 6 (six) hours as needed. for pain 02/18/16  Yes Historical Provider, MD       Physical Exam: BP 147/67   Pulse 67   Temp 97.8 F (36.6 C) (Oral)   Resp 22   SpO2 98%  General appearance: Well-developed, thin elderly adult male, alert and in no acut distress.    Eyes: Anicteric, conjunctiva pink, lids and lashes normal.     ENT: No nasal deformity, discharge, or epistaxis.  OP moist without  lesions.   Lymph: No cervical or supraclavicular lymphadenopathy. Skin: Warm and dry.  No jaundice.  No suspicious rashes or lesions. Cardiac: RRR, nl S1-S2, no murmurs appreciated.  Capillary refill is brisk.  No JVD.  Trace ankle edema.  Radial pulse on left normal, on right is thrill from fistula, DP pulses 2+ and symmetric. Respiratory: Normal respiratory rate and rhythm.  CTAB without wheezes.  No crackles that I appreciate.  Has Garfield in place. Abdomen: Abdomen soft without rigidity.  No TTP. No ascites, distension.   MSK: No deformities or effusions. Neuro: Cranial nerves normal.  Sensorium intact and responding to questions, attention normal.  Speech is fluent.  Moves all extremities equally and with normal coordination.    Psych: Behavior appropriate.  Affect normal.  No evidence of aural or visual hallucinations or delusions.       Labs on Admission:  I have personally reviewed the following studies: The metabolic panel shows mild hyperkalemia, stable chronic kidney disease with baseline Cr 2.5. The complete blood count shows chronic stable anemia without leukocytosis. BNP moderately high. Troponin negative.   Radiological Exams on Admission: Personally reviewed: Dg Chest 2 View  Result Date: 04/03/2016 CLINICAL DATA:  Shortness of breath for a couple of weeks, cough and congestion. EXAM: CHEST  2 VIEW COMPARISON:  Chest x-rays dated 12/10/2015 and 08/08/2014. FINDINGS: Mild cardiomegaly is stable. Overall cardiomediastinal silhouette appears stable. Left chest wall pacemaker/ICD in place. Median sternotomy wires stable in position. There is central pulmonary vascular congestion mild interstitial edema. Suspect small bilateral pleural effusions. No confluent airspace opacity to suggest a developing pneumonia. Osseous structures about the chest are unremarkable. IMPRESSION: 1. Cardiomegaly with central pulmonary vascular congestion and mild bilateral interstitial edema suggesting  CHF/volume overload. 2. Probable small pleural effusions. 3. No evidence of pneumonia seen. Electronically Signed   By: Franki Cabot M.D.   On: 04/03/2016 22:05    EKG: Independently reviewed. Rate 67, QTc 477, aflutter    Assessment/Plan 1. Acute on chronic systolic CHF:  Presumably precipitated by dysrhythmia and poor renal function. -Furosemide 80 mg just once daily, given renal function -Daily BMP -Strict I/Os, daily weights -Telemetry monitoring -Consult to Cardiology, appreciate cares -Continue aspirin and statin -Check TSH  2. Atrial fibrillation:  Previously had Afib, paroxysmal, per Dr. Hassell Done notes.  CHADS2-VASc 5.  Warfarin is indicated.  Currently rate controlled without medicine.   -Consult to Dr. Hassell Done partners to discuss anticoagulation  3. Hyperkalemia:  -Monitor while on furosemide  4. COPD:  -Hold doxycyline, pneumonia doubted  5. Hypothyroidism:  -Check TSH -Continue levothyroxine  6. Anemia of CKD:  Stable.  7. CKD stage IV:  Stable renal function.  Has fistula, not sure if  It is matured. -Continue calcitriol and sodium bicarb  8. IDDM: -Check HgbA1c -Continue insulin -Hold orals -Low dose sliding scale given renal disease  9. Neuropathy: -Continue gabapentin and tramadol  10. Dementia: -Continue donepezil  11. GERD: -Continue PPI  12. BPH: -Continue tamsulosin     DVT prophylaxis: Lovenox, low dose Code Status: FULL  Family Communication: Daughter and grandchildren at bedside.  Overnight plan discussed.  CHF and diuresis and close monitoring of renal function discussed (has had worsening of renal function with daily furosemide in the past).  Consult to Cardiology discussed. Disposition Plan: Anticipate diuresis gently, close monitoring of renal function.  Consult to Cardiology for Afib. Consults called: None overnight Admission status: INPATIENT, tele   Medical decision making: Patient seen at 11:30 AM on 04/03/2016.   The patient was discussed with Dr. Adela Glimpse.  What exists of the patient's chart was reviewed in depth.  Clinical condition: stable.          Edwin Dada [facility] Pager [phone]

## 2016-04-04 NOTE — Progress Notes (Signed)
PROGRESS NOTE  Patrick Boyle [id number] DOB: 07-08-1934 DOA: 04/03/2016 PCP: Orpah Melter, MD  Brief History:  82 year old male with a history of systolic CHF, ischemic cardiomyopathy status post AICD, diabetes mellitus type 2, CKD stage IV presented with 2-3 week history of worsening dyspnea on exertion. The patient also has been complaining of increasing orthopnea type symptoms and increasing lower extremity/ankle edema. His symptoms worsened to the point he was unable to walk to his mailbox. He saw his primary care provider earlier on the week prior to admission and was prescribed doxycycline without significant improvement. Evaluation in emergency department revealed a chest x-ray that showed pulmonary vascular congestion and bilateral interstitial edema with BNP 521. The patient was started on intravenous furosemide with good clinical effect. Notably, the patient states that he previously took furosemide 20 mg 3 times per week, but this was decreased to 2 times per week approximately 2 months ago when he visited his nephrologist.  Assessment/Plan: Acute on chronic systolic CHF -He appears much more euvolemic today -Plan to switch to oral furosemide and next 24 hours -Daily weights -NEG 1 L since admit -04/04/2016 echo EF 25-30%, AK mid inferolateral myocardium  Paroxysmal atrial fibrillation -Review of the medical record shows that the patient has had a history of atrial fibrillation (Dr. Hassell Done note on 02/26/16); however, the patient's wife and daughter state that they have never been told of this diagnosis -They are requesting cardiology evaluation for further clarification -rate controlled presently -TSH 3.677 -CHADSVASc = 5 (CHF, Age, DM, CAD) -continue ASA for now -defer to cardiology on anticoagulation  CAD/Ischemic cardiomyopathy -no anginal symptoms presently -s/p AICD -04/04/16 interrogation--no shocks given  Hyperkalemia -improved with lasix IV  CKD  stage 4 -baseline creatinine 2.5.-2.8 -monitor with diuresis -Patient has a functional right upper extremity fistula  Diabetes mellitus type 2 with renal complications  -[id number] A1c 7.6 -NovoLog sliding scale -Lantus 15 units daily  Cognitive impairment -Continue Aricept 10mg  daily  Peripheral neuropathy -likely diabetic polyneuropathy  -Continue tramadol and gabapentin   COPD -mild per pulmonary -stable   Disposition Plan:   Home in 1-2 days pending cardiology eval Family Communication:   Wife and daughter updated at bedside 8/5--Total time spent 40 minutes.  Greater than 50% spent face to face counseling and coordinating care.   Consultants:  Cardiology  Code Status:  FULL  DVT Prophylaxis:  [place] Heparin / [place] Lovenox   Procedures: As Listed in Progress Note Above  Antibiotics: None    Subjective: Patient denies fevers, chills, headache, chest pain, dyspnea, nausea, vomiting, diarrhea, abdominal pain, dysuria, hematuria, hematochezia, and melena.  He is breathing better   Objective: Vitals:   04/03/16 2330 04/04/16 0015 04/04/16 0420 04/04/16 1200  BP: 147/67 (!) 149/76 (!) 143/70 (!) 151/66  Pulse: 67 70 (!) 51 76  Resp: 22 (!) 21 20 20   Temp:  97.6 F (36.4 C) 98 F (36.7 C) 98 F (36.7 C)  TempSrc:  Oral Oral Oral  SpO2: 98% 100% 98% 100%  Weight:  80.2 kg (176 lb 14.4 oz)    Height:  5\' 11"  (1.803 m)      Intake/Output Summary (Last 24 hours) at 04/04/16 1324 Last data filed at 04/04/16 1259  Gross per 24 hour  Intake              360 ml  Output             1450 ml  Net            -1090 ml   Weight change:  Exam:   General:  Pt is alert, follows commands appropriately, not in acute distress  HEENT: No icterus, No thrush, No neck mass, [place]/AT  Cardiovascular: RRR, S1/S2, no rubs, no gallops  Respiratory: Fine bibasilar crackles. No wheeze. Good air movement.   Abdomen: Soft/+BS, non tender, non distended, no  guarding  Extremities: trace LE edema, No lymphangitis, No petechiae, No rashes, no synovitis   Data Reviewed: I have personally reviewed following labs and imaging studies Basic Metabolic Panel:  Recent Labs Lab 04/03/16 2008 04/04/16 0648  NA 138 140  K 5.7* 4.2  CL 108 107  CO2 23 24  GLUCOSE 198* 125*  BUN 40* 38*  CREATININE 2.56* 2.43*  CALCIUM 9.2 9.1  MG  --  2.0   Liver Function Tests: No results for input(s): AST, ALT, ALKPHOS, BILITOT, PROT, ALBUMIN in the last 168 hours. No results for input(s): LIPASE, AMYLASE in the last 168 hours. No results for input(s): AMMONIA in the last 168 hours. Coagulation Profile: No results for input(s): INR, PROTIME in the last 168 hours. CBC:  Recent Labs Lab 04/03/16 2008 04/04/16 0648  WBC 6.7 6.6  NEUTROABS 4.3  --   HGB 11.4* 10.7*  HCT 36.7* 33.9*  MCV 97.3 96.9  PLT 158 145*   Cardiac Enzymes: No results for input(s): CKTOTAL, CKMB, CKMBINDEX, TROPONINI in the last 168 hours. BNP: Invalid input(s): POCBNP CBG:  Recent Labs Lab 04/04/16 0034 04/04/16 0624 04/04/16 1215  GLUCAP 153* 118* 187*   HbA1C: No results for input(s): HGBA1C in the last 72 hours. Urine analysis:    Component Value Date/Time   COLORURINE YELLOW 12/10/2015 1615   APPEARANCEUR TURBID (A) 12/10/2015 1615   LABSPEC 1.011 12/10/2015 1615   PHURINE 5.5 12/10/2015 1615   GLUCOSEU NEGATIVE 12/10/2015 1615   HGBUR MODERATE (A) 12/10/2015 1615   BILIRUBINUR NEGATIVE 12/10/2015 1615   KETONESUR NEGATIVE 12/10/2015 1615   PROTEINUR NEGATIVE 12/10/2015 1615   UROBILINOGEN 0.2 08/20/2014 2143   NITRITE NEGATIVE 12/10/2015 1615   LEUKOCYTESUR LARGE (A) 12/10/2015 1615   Sepsis Labs: @LABRCNTIP (procalcitonin:4,lacticidven:4) ) Recent Results (from the past 240 hour(s))  MRSA PCR Screening     Status: None   Collection Time: 04/04/16 12:32 AM  Result Value Ref Range Status   MRSA by PCR NEGATIVE NEGATIVE Final    Comment:         The GeneXpert MRSA Assay (FDA approved for NASAL specimens only), is one component of a comprehensive MRSA colonization surveillance program. It is not intended to diagnose MRSA infection nor to guide or monitor treatment for MRSA infections.      Scheduled Meds: . aspirin EC  81 mg Oral Daily  . calcitRIOL  0.25 mcg Oral Daily  . donepezil  10 mg Oral QHS  . enoxaparin (LOVENOX) injection  30 mg Subcutaneous Daily  . furosemide  80 mg Intravenous Daily  . gabapentin  300 mg Oral BID  . insulin aspart  0-5 Units Subcutaneous QHS  . insulin aspart  0-9 Units Subcutaneous TID WC  . insulin glargine  15 Units Subcutaneous QHS  . levothyroxine  50 mcg Oral QAC breakfast  . pantoprazole  20 mg Oral Daily  . simvastatin  40 mg Oral Daily  . sodium bicarbonate  650 mg Oral BID  . tamsulosin  0.4 mg Oral QPC supper   Continuous Infusions:   Procedures/Studies: Dg Chest  2 View  Result Date: 04/03/2016 CLINICAL DATA:  Shortness of breath for a couple of weeks, cough and congestion. EXAM: CHEST  2 VIEW COMPARISON:  Chest x-rays dated 12/10/2015 and 08/08/2014. FINDINGS: Mild cardiomegaly is stable. Overall cardiomediastinal silhouette appears stable. Left chest wall pacemaker/ICD in place. Median sternotomy wires stable in position. There is central pulmonary vascular congestion mild interstitial edema. Suspect small bilateral pleural effusions. No confluent airspace opacity to suggest a developing pneumonia. Osseous structures about the chest are unremarkable. IMPRESSION: 1. Cardiomegaly with central pulmonary vascular congestion and mild bilateral interstitial edema suggesting CHF/volume overload. 2. Probable small pleural effusions. 3. No evidence of pneumonia seen. Electronically Signed   By: Franki Cabot M.D.   On: 04/03/2016 22:05    Merville Hijazi, DO  [facility] Pager [phone]  If 7PM-7AM, please contact night-coverage [email or web address] Password [email or web address] 04/04/2016, 1:24  PM   LOS: 0 days

## 2016-04-05 ENCOUNTER — Encounter (HOSPITAL_COMMUNITY): Payer: Self-pay | Admitting: Nurse Practitioner

## 2016-04-05 DIAGNOSIS — I481 Persistent atrial fibrillation: Secondary | ICD-10-CM

## 2016-04-05 DIAGNOSIS — I4891 Unspecified atrial fibrillation: Secondary | ICD-10-CM

## 2016-04-05 DIAGNOSIS — I10 Essential (primary) hypertension: Secondary | ICD-10-CM

## 2016-04-05 LAB — BASIC METABOLIC PANEL
Anion gap: 14 (ref 5–15)
BUN: 47 mg/dL — AB (ref 6–20)
CHLORIDE: 99 mmol/L — AB (ref 101–111)
CO2: 26 mmol/L (ref 22–32)
CREATININE: 3.05 mg/dL — AB (ref 0.61–1.24)
Calcium: 9.2 mg/dL (ref 8.9–10.3)
GFR calc Af Amer: 21 mL/min — ABNORMAL LOW (ref >60)
GFR, EST NON AFRICAN AMERICAN: 18 mL/min — AB (ref >60)
GLUCOSE: 115 mg/dL — AB (ref 65–99)
Potassium: 3.8 mmol/L (ref 3.5–5.1)
SODIUM: 139 mmol/L (ref 135–145)

## 2016-04-05 LAB — GLUCOSE, CAPILLARY: Glucose-Capillary: 134 mg/dL — ABNORMAL HIGH (ref 65–99)

## 2016-04-05 LAB — MAGNESIUM: MAGNESIUM: 1.9 mg/dL (ref 1.7–2.4)

## 2016-04-05 MED ORDER — RIVAROXABAN 15 MG PO TABS: 15.0000 mg | ORAL_TABLET | Freq: Every day | ORAL | 1 refills | Status: DC

## 2016-04-05 MED ORDER — FUROSEMIDE 20 MG PO TABS: 20.0000 mg | ORAL_TABLET | Freq: Every day | ORAL | 0 refills | Status: DC

## 2016-04-05 NOTE — Progress Notes (Signed)
Patient and daughter given discharge instructions and all questions answered.  Patient discharged via wheelchair with all belongings. 

## 2016-04-05 NOTE — Progress Notes (Signed)
CM met with pt and pt's daughter in room and gave pt 30 day free trial card for Xarelto.  Family verbalize understanding the card will pay for today's discharge prescription and give insurance enough time to authorize for refills.  Pt lives with wife and has support of duaghter; has rolling walker at home and denies need for [facility] services. No other Cm needs were communicated.

## 2016-04-05 NOTE — Discharge Summary (Signed)
Physician Discharge Summary  Patrick Boyle [id number] DOB: [birth year] DOA: 04/03/2016  PCP: Orpah Melter, MD  Admit date: 04/03/2016 Discharge date: 04/05/2016  Admitted From: Home Disposition:  Home  Recommendations for Outpatient Follow-up:  1. Follow up with PCP in 1-2 weeks 2. Please obtain BMP/CBC in one week  Home Health:NO Equipment/Devices:none  Discharge Condition:stable CODE STATUS:FULL Diet recommendation: Heart Healthy   Brief/Interim Summary: 82 year old male with a history of systolic CHF, ischemic cardiomyopathy status post AICD, diabetes mellitus type 2, CKD stage IV presented with 2-3 week history of worsening dyspnea on exertion. The patient also has been complaining of increasing orthopnea type symptoms and increasing lower extremity/ankle edema. His symptoms worsened to the point he was unable to walk to his mailbox. He saw his primary care provider earlier on the week prior to admission and was prescribed doxycycline without significant improvement. Evaluation in emergency department revealed a chest x-ray that showed pulmonary vascular congestion and bilateral interstitial edema with BNP 521. The patient was started on intravenous furosemide with good clinical effect. Notably, the patient states that he previously took furosemide 20 mg 3 times per week, but this was decreased to 2 times per week approximately 2 months ago when he visited his nephrologist.  Discharge Diagnoses:  Acute on chronic systolic CHF -euvolemic today -home with lasix 20 mg po daily -case was discussed with pt's nephrologist, Dr. Lorrene Reid -Daily weights -NEG 2.1 L since admit -Weight on the day of discharge 168 pounds although suspect this was not accurate as the patient lost 8 pounds in the past 24 hours -04/04/2016 echo EF 25-30%, AK mid inferolateral myocardium  Paroxysmal atrial fibrillation -Review of the medical record shows that the patient has had a history of atrial  fibrillation (Dr. Hassell Done note on 02/26/16); however, the patient's wife and daughter state that they have never been told of this diagnosis -They are requesting cardiology evaluation for further clarification -rate controlled presently -pt seen by Dr. Joni Reining, in early 2014 he had palpitations and his primary medical doctor check an EKG which showed atrial fibrillation. In subsequent review of notes in epic from both Dr. Irish Lack as well as Dr. Rayann Heman and ICD interrogations, there does not appear to be any evidence of atrial fibrillation up until now. Clearly his EKG currently demonstrates atrial fibrillation.  -TSH 3.677 -CHADSVASc = 5 (CHF, Age, DM, CAD) -d/c ASA -started rivaroxaban 15 mg daily--case discussed with Dr. Benancio Deeds need close follow up on renal function--if worsens, will need to d/c and switch to warfarin  CAD/Ischemic cardiomyopathy -no anginal symptoms presently -s/p AICD -04/04/16 interrogation--no shocks given  Hyperkalemia -improved with lasix IV  CKD stage 4 -baseline creatinine 2.5.-2.8 -monitor with diuresis -Patient has a functional right upper extremity fistula -serum creatinine 3.05 on day of d/c  Coronary artery disease/ischemic cardiomyopathy  - ICD in place  - Normal function, no discharges  - No anginal symptoms  Diabetes mellitus type 2 with renal complications  -[id number] A1c 7.6 -NovoLog sliding scale -home with previous dose of Toujeo  Cognitive impairment -Continue Aricept 10mg  daily  Peripheral neuropathy -likely diabetic polyneuropathy  -Continue tramadol and gabapentin   COPD -mild per pulmonary -stable   Discharge Instructions  Discharge Instructions    Diet - low sodium heart healthy    Complete by:  As directed   Increase activity slowly    Complete by:  As directed       Medication List    STOP taking these medications   aspirin 81 MG  tablet   fish oil-omega-3 fatty acids 1000 MG  capsule     TAKE these medications   ANTIVERT PO Take 5 mg by mouth as needed (VERTIGO).   bimatoprost 0.03 % ophthalmic solution Commonly known as:  LUMIGAN Place 1 drop into both eyes at bedtime.   calcitRIOL 0.25 MCG capsule Commonly known as:  ROCALTROL Take 0.25 mcg by mouth daily.   cetirizine 10 MG tablet Commonly known as:  ZYRTEC Take 10 mg by mouth daily.   cholecalciferol 1000 units tablet Commonly known as:  VITAMIN D Take 1,000 Units by mouth daily.   donepezil 10 MG tablet Commonly known as:  ARICEPT Take 10 mg by mouth at bedtime.   furosemide 20 MG tablet Commonly known as:  LASIX Take 1 tablet (20 mg total) by mouth daily. What changed:  when to take this   gabapentin 300 MG capsule Commonly known as:  NEURONTIN Take 300 mg by mouth 2 (two) times daily.   lansoprazole 30 MG capsule Commonly known as:  PREVACID Take 30 mg by mouth daily as needed (For acid reflux.).   levothyroxine 50 MCG tablet Commonly known as:  SYNTHROID, LEVOTHROID Take 50 mcg by mouth daily before breakfast.   Rivaroxaban 15 MG Tabs tablet Commonly known as:  XARELTO Take 1 tablet (15 mg total) by mouth daily with supper.   simvastatin 40 MG tablet Commonly known as:  ZOCOR Take 40 mg by mouth every morning.   sitaGLIPtin 25 MG tablet Commonly known as:  JANUVIA Take 25 mg by mouth daily with breakfast.   sodium bicarbonate 650 MG tablet Take 1 tablet (650 mg total) by mouth 2 (two) times daily.   tamsulosin 0.4 MG Caps capsule Commonly known as:  FLOMAX Take 1 capsule (0.4 mg total) by mouth daily after supper.   TOUJEO SOLOSTAR 300 UNIT/ML Sopn Generic drug:  Insulin Glargine Inject 15-30 Units into the skin at bedtime.   traMADol 50 MG tablet Commonly known as:  ULTRAM Take 50 mg by mouth every 6 (six) hours as needed. for pain   TYLENOL 8 HOUR ARTHRITIS PAIN 650 MG CR tablet Generic drug:  acetaminophen Take 650 mg by mouth every 8 (eight) hours as  needed for pain.       Allergies  Allergen Reactions  . Amoxicillin Other (See Comments)    Vomiting and shaking Has patient had a PCN reaction causing immediate rash, facial/tongue/throat swelling, SOB or lightheadedness with hypotension: no Has patient had a PCN reaction causing severe rash involving mucus membranes or skin necrosis: no Has patient had a PCN reaction that required hospitalization: occurred twice and second time was hospitalized for this and other symptoms Has patient had a PCN reaction occurring within the last 10 years: yes If all of the above answers are "NO", then ma  . Metformin And Related Other (See Comments)    GI sensitivity with high doses  . Ramipril Cough    Consultations:  Cardiology  Nephrology by phone   Procedures/Studies: Dg Chest 2 View  Result Date: 04/03/2016 CLINICAL DATA:  Shortness of breath for a couple of weeks, cough and congestion. EXAM: CHEST  2 VIEW COMPARISON:  Chest x-rays dated 12/10/2015 and 08/08/2014. FINDINGS: Mild cardiomegaly is stable. Overall cardiomediastinal silhouette appears stable. Left chest wall pacemaker/ICD in place. Median sternotomy wires stable in position. There is central pulmonary vascular congestion mild interstitial edema. Suspect small bilateral pleural effusions. No confluent airspace opacity to suggest a developing pneumonia. Osseous structures about the  chest are unremarkable. IMPRESSION: 1. Cardiomegaly with central pulmonary vascular congestion and mild bilateral interstitial edema suggesting CHF/volume overload. 2. Probable small pleural effusions. 3. No evidence of pneumonia seen. Electronically Signed   By: Franki Cabot M.D.   On: 04/03/2016 22:05        Discharge Exam: Vitals:   04/04/16 2356 04/05/16 0415  BP: (!) 150/68 (!) 134/50  Pulse: 80 74  Resp: 16 16  Temp: 97.6 F (36.4 C) 97.3 F (36.3 C)   Vitals:   04/04/16 1200 04/04/16 1954 04/04/16 2356 04/05/16 0415  BP: (!) 151/66  134/75 (!) 150/68 (!) 134/50  Pulse: 76 (!) 58 80 74  Resp: 20 16 16 16   Temp: 98 F (36.7 C) 97.6 F (36.4 C) 97.6 F (36.4 C) 97.3 F (36.3 C)  TempSrc: Oral Oral Oral Oral  SpO2: 100% 98% 99% 97%  Weight:    76.4 kg (168 lb 6.4 oz)  Height:        General: Pt is alert, awake, not in acute distress Cardiovascular: IRRR, S1/S2 +, no rubs, no gallops Respiratory: CTA bilaterally, no wheezing, no rhonchi Abdominal: Soft, NT, ND, bowel sounds + Extremities: trace LE edema, no cyanosis   The results of significant diagnostics from this hospitalization (including imaging, microbiology, ancillary and laboratory) are listed below for reference.    Significant Diagnostic Studies: Dg Chest 2 View  Result Date: 04/03/2016 CLINICAL DATA:  Shortness of breath for a couple of weeks, cough and congestion. EXAM: CHEST  2 VIEW COMPARISON:  Chest x-rays dated 12/10/2015 and 08/08/2014. FINDINGS: Mild cardiomegaly is stable. Overall cardiomediastinal silhouette appears stable. Left chest wall pacemaker/ICD in place. Median sternotomy wires stable in position. There is central pulmonary vascular congestion mild interstitial edema. Suspect small bilateral pleural effusions. No confluent airspace opacity to suggest a developing pneumonia. Osseous structures about the chest are unremarkable. IMPRESSION: 1. Cardiomegaly with central pulmonary vascular congestion and mild bilateral interstitial edema suggesting CHF/volume overload. 2. Probable small pleural effusions. 3. No evidence of pneumonia seen. Electronically Signed   By: Franki Cabot M.D.   On: 04/03/2016 22:05     Microbiology: Recent Results (from the past 240 hour(s))  MRSA PCR Screening     Status: None   Collection Time: 04/04/16 12:32 AM  Result Value Ref Range Status   MRSA by PCR NEGATIVE NEGATIVE Final    Comment:        The GeneXpert MRSA Assay (FDA approved for NASAL specimens only), is one component of a comprehensive MRSA  colonization surveillance program. It is not intended to diagnose MRSA infection nor to guide or monitor treatment for MRSA infections.      Labs: Basic Metabolic Panel:  Recent Labs Lab 04/03/16 2008 04/04/16 0648 04/05/16 0459  NA 138 140 139  K 5.7* 4.2 3.8  CL 108 107 99*  CO2 23 24 26   GLUCOSE 198* 125* 115*  BUN 40* 38* 47*  CREATININE 2.56* 2.43* 3.05*  CALCIUM 9.2 9.1 9.2  MG  --  2.0 1.9   Liver Function Tests: No results for input(s): AST, ALT, ALKPHOS, BILITOT, PROT, ALBUMIN in the last 168 hours. No results for input(s): LIPASE, AMYLASE in the last 168 hours. No results for input(s): AMMONIA in the last 168 hours. CBC:  Recent Labs Lab 04/03/16 2008 04/04/16 0648  WBC 6.7 6.6  NEUTROABS 4.3  --   HGB 11.4* 10.7*  HCT 36.7* 33.9*  MCV 97.3 96.9  PLT 158 145*   Cardiac Enzymes:  No results for input(s): CKTOTAL, CKMB, CKMBINDEX, TROPONINI in the last 168 hours. BNP: Invalid input(s): POCBNP CBG:  Recent Labs Lab 04/04/16 0624 04/04/16 1215 04/04/16 1639 04/04/16 2211 04/05/16 0619  GLUCAP 118* 187* 185* 151* 134*    Time coordinating discharge:  Greater than 30 minutes  Signed:  Mally Gavina, DO [facility] Pager: [phone] 04/05/2016, 10:54 AM

## 2016-04-05 NOTE — Progress Notes (Signed)
Cardiologist: Patrick Boyle Subjective:   No significant shortness of breath. Improved since admission. No chest pain.  Objective:  Vital Signs in the last 24 hours: Temp:  [97.3 F (36.3 C)-98 F (36.7 C)] 97.3 F (36.3 C) (08/06 0415) Pulse Rate:  [58-80] 74 (08/06 0415) Resp:  [16-20] 16 (08/06 0415) BP: (134-151)/(50-75) 134/50 (08/06 0415) SpO2:  [97 %-100 %] 97 % (08/06 0415) Weight:  [168 lb 6.4 oz (76.4 kg)] 168 lb 6.4 oz (76.4 kg) (08/06 0415)  Intake/Output from previous day: 08/05 0701 - 08/06 0700 In: 960 [P.O.:960] Out: 2575 [Urine:2575]   Physical Exam: General: Well developed, well nourished, in no acute distress. Head:  Normocephalic and atraumatic. Lungs: Clear to auscultation and percussion. Heart: Normal S1 and S2.  No murmur, rubs or gallops.  Abdomen: soft, non-tender, positive bowel sounds. Extremities: No clubbing or cyanosis. No edema. Neurologic: Alert and oriented x 3.    Lab Results:  Recent Labs  04/03/16 2008 04/04/16 0648  WBC 6.7 6.6  HGB 11.4* 10.7*  PLT 158 145*    Recent Labs  04/04/16 0648 04/05/16 0459  NA 140 139  K 4.2 3.8  CL 107 99*  CO2 24 26  GLUCOSE 125* 115*  BUN 38* 47*  CREATININE 2.43* 3.05*   No results for input(s): TROPONINI in the last 72 hours.  Invalid input(s): CK, MB Hepatic Function Panel No results for input(s): PROT, ALBUMIN, AST, ALT, ALKPHOS, BILITOT, BILIDIR, IBILI in the last 72 hours. No results for input(s): CHOL in the last 72 hours. No results for input(s): PROTIME in the last 72 hours.  Imaging: Dg Chest 2 View  Result Date: 04/03/2016 CLINICAL DATA:  Shortness of breath for a couple of weeks, cough and congestion. EXAM: CHEST  2 VIEW COMPARISON:  Chest x-rays dated 12/10/2015 and 08/08/2014. FINDINGS: Mild cardiomegaly is stable. Overall cardiomediastinal silhouette appears stable. Left chest wall pacemaker/ICD in place. Median sternotomy wires stable in position. There is central  pulmonary vascular congestion mild interstitial edema. Suspect small bilateral pleural effusions. No confluent airspace opacity to suggest a developing pneumonia. Osseous structures about the chest are unremarkable. IMPRESSION: 1. Cardiomegaly with central pulmonary vascular congestion and mild bilateral interstitial edema suggesting CHF/volume overload. 2. Probable small pleural effusions. 3. No evidence of pneumonia seen. Electronically Signed   By: Franki Cabot M.D.   On: 04/03/2016 22:05   Personally viewed.   Telemetry: AFIB rate controlled Personally viewed.   EKG:  Afib rate controlled Personally viewed.  Cardiac Studies:  EF 25%  Meds: Scheduled Meds: . calcitRIOL  0.25 mcg Oral Daily  . donepezil  10 mg Oral QHS  . furosemide  20 mg Oral Daily  . gabapentin  300 mg Oral BID  . insulin aspart  0-5 Units Subcutaneous QHS  . insulin aspart  0-9 Units Subcutaneous TID WC  . insulin glargine  15 Units Subcutaneous QHS  . levothyroxine  50 mcg Oral QAC breakfast  . pantoprazole  20 mg Oral Daily  . rivaroxaban  15 mg Oral Q supper  . simvastatin  40 mg Oral Daily  . sodium bicarbonate  650 mg Oral BID  . tamsulosin  0.4 mg Oral QPC supper   Continuous Infusions:  PRN Meds:.acetaminophen **OR** acetaminophen, traMADol  Assessment/Plan:  Principal Problem:   Acute on chronic systolic CHF (congestive heart failure) (HCC) Active Problems:   Essential hypertension   COPD (chronic obstructive pulmonary disease) (HCC)   Anemia   CKD (chronic kidney disease) stage 4,  GFR 15-29 ml/min (HCC)   Uncontrolled diabetes mellitus with diabetic nephropathy, with long-term current use of insulin (HCC)   Hypothyroidism (acquired)   Hyperkalemia   Atrial fibrillation (HCC)   Acute on chronic systolic heart failure  - EF 25-30%  - IV Lasix  - Negative aproximally 2 L  - Current weight 168 pounds  - I'm fine with him converting over to by mouth Lasix today as per plan. Discussed with  Dr. Carles Collet.  Paroxysmal atrial fibrillation  -  medical record was reviewed by Dr. Carles Collet, Dr. Irish Boyle mentioned on 02/26/16 that he did have a history of atrial fibrillation from early 2014 when he had palpitations and his primary medical doctor check an EKG which showed atrial fibrillation. In subsequent review of notes in epic from both Dr. Irish Boyle as well as Dr. Rayann Heman and ICD interrogations, there does not appear to be any evidence of atrial fibrillation up until now. Clearly his EKG currently demonstrates atrial fibrillation. With his cardiomyopathy, age, he is prone to development of atrial fibrillation. Discussed at length with family  - CHADS-VASC - at least 5, warrants anticoagulation  - Recommend Xarelto 15 mg once a day given his creatinine clearance of 21.1-25.9 mL/m. he's of use. There is a risk of bleeding. Stop aspirin.  - About 3 years ago as family states that he did have a GI bleed surrounding a urinary tract infection, colonoscopy performed did not show any source. Received transfusion at that time. Expressed to them that he is at increased risk for bleeding with anticoagulation however benefits outweigh risks.  - If Dr. Lorrene Reid would rather him be on Coumadin, I am fine with this.  Coronary artery disease/ischemic cardiomyopathy  - ICD in place  - Normal function, no discharges  - No anginal symptoms  Electrolyte abnormalities/hyperkalemia  - Improved with IV Lasix, now PO today  Chronic kidney disease stage IV  - Baseline creatinine between 2.5 and 2.8, mildly increased to 3 today  Diabetes mellitus with renal manifestations  - Per primary team, hemoglobin A1c 7.6  Memory impairment  - Aricept  COPD mild  Comfortable with discharge from cardiology perspective. Consider Lasix 20 mg once a day at home. Was previously on every other day 20 mg.  Would discontinue fish oil given his Xarelto use. Can increase risk of bleeding.  Candee Furbish 04/05/2016, 8:42 AM

## 2016-04-05 NOTE — Progress Notes (Signed)
CM spoke to the patient at the bedside who said that he lives at home with his spouse and his daughter lives within a mile. He declined any HH needs and said that he has a cane and walker. He did not want any further DME. CM explained [facility] and member agreeable to referral. No further CM needs communicated at this time.

## 2016-04-06 ENCOUNTER — Other Ambulatory Visit: Payer: Self-pay | Admitting: *Deleted

## 2016-04-06 ENCOUNTER — Encounter: Payer: Self-pay | Admitting: *Deleted

## 2016-04-06 LAB — HEMOGLOBIN A1C
HEMOGLOBIN A1C: 6.8 % — AB (ref 4.8–5.6)
MEAN PLASMA GLUCOSE: 148 mg/dL

## 2016-04-06 NOTE — Patient Outreach (Signed)
Mr. Mayeaux is well known to me as we have been involved in care management activities in the past. He and his wife follow recommendations and take their health care seriously. They know that I am easy to contact and that they can depend on me for answering questions and guiding them towards positive outcomes.  I have advised that I will call weekly to follow on his progress and that they may call me anytime for problems.  [facility] CM Care Plan Problem One   Flowsheet Row Most Recent Value  Care Plan Problem One  New diagnosis of CHF  Role Documenting the Problem One  Care Management Coordinator  Care Plan for Problem One  Active  [facility] Long Term Goal (31-90 days)  Pt will not have a readmission for HF over the next 31 days.  [facility] Long Term Goal Start Date  04/06/16  Interventions for Problem One Long Term Goal  Reviewed information in CHF education packet, emphasizing the daily maintenance and when to call me or MD.  [facility] CM Short Term Goal #1 (0-30 days)  Will will weigh daily and record this on the calendar over the next month..  [facility] CM Short Term Goal #1 Start Date  04/06/16  Interventions for Short Term Goal #1  Advised daily weighing is most important to do so you can recognize wt gain rapidly and call for assistance.  [facility] CM Short Term Goal #2 (0-30 days)  Pt will call NP if he has sxs in the yellow zone over the next month.  [facility] CM Short Term Goal #2 Start Date  04/06/16  Interventions for Short Term Goal #2  Reinforced to pt and wife that I am available for questions and working out problems. Reminded them of the 24 hour nurse line also.  [facility] CM Short Term Goal #3 (0-30 days)  Pt will review the Emmi programs assigned over the next 30 days.  [facility] CM Short Term Goal #3 Start Date  04/06/16      Deloria Lair [facility] [place] [phone]

## 2016-04-06 NOTE — Patient Outreach (Signed)
Depression screen [facility] 2/9 04/06/2016 12/25/2015 12/18/2015 02/19/2015  Decreased Interest 1 0 0 0  Down, Depressed, Hopeless 0 1 1 1   PHQ - 2 Score 1 1 1 1    Fall Risk  04/06/2016 12/18/2015 02/19/2015  Falls in the past year? No No No  Risk for fall due to : Impaired balance/gait - History of fall(s);Impaired balance/gait   Patient was recently discharged from hospital and all medications have been reviewed.    Medication List       Accurate as of 04/06/16  4:37 PM. Always use your most recent med list.          ANTIVERT PO Take 5 mg by mouth as needed (VERTIGO).   bimatoprost 0.03 % ophthalmic solution Commonly known as:  LUMIGAN Place 1 drop into both eyes at bedtime.   calcitRIOL 0.25 MCG capsule Commonly known as:  ROCALTROL Take 0.25 mcg by mouth daily.   cetirizine 10 MG tablet Commonly known as:  ZYRTEC Take 10 mg by mouth daily.   cholecalciferol 1000 units tablet Commonly known as:  VITAMIN D Take 1,000 Units by mouth daily.   donepezil 10 MG tablet Commonly known as:  ARICEPT Take 10 mg by mouth at bedtime.   furosemide 20 MG tablet Commonly known as:  LASIX Take 1 tablet (20 mg total) by mouth daily.   gabapentin 300 MG capsule Commonly known as:  NEURONTIN Take 300 mg by mouth 2 (two) times daily.   lansoprazole 30 MG capsule Commonly known as:  PREVACID Take 30 mg by mouth daily as needed (For acid reflux.).   levothyroxine 50 MCG tablet Commonly known as:  SYNTHROID, LEVOTHROID Take 50 mcg by mouth daily before breakfast.   Rivaroxaban 15 MG Tabs tablet Commonly known as:  XARELTO Take 1 tablet (15 mg total) by mouth daily with supper.   simvastatin 40 MG tablet Commonly known as:  ZOCOR Take 40 mg by mouth every morning.   sitaGLIPtin 25 MG tablet Commonly known as:  JANUVIA Take 25 mg by mouth daily with breakfast.   sodium bicarbonate 650 MG tablet Take 1 tablet (650 mg total) by mouth 2 (two) times daily.   tamsulosin 0.4 MG Caps  capsule Commonly known as:  FLOMAX Take 1 capsule (0.4 mg total) by mouth daily after supper.   TOUJEO SOLOSTAR 300 UNIT/ML Sopn Generic drug:  Insulin Glargine Inject 15-30 Units into the skin at bedtime.   traMADol 50 MG tablet Commonly known as:  ULTRAM Take 50 mg by mouth every 6 (six) hours as needed. for pain   TYLENOL 8 HOUR ARTHRITIS PAIN 650 MG CR tablet Generic drug:  acetaminophen Take 650 mg by mouth every 8 (eight) hours as needed for pain.      Deloria Lair [facility] [place] [phone]

## 2016-04-13 ENCOUNTER — Other Ambulatory Visit: Payer: Self-pay | Admitting: *Deleted

## 2016-04-13 ENCOUNTER — Telehealth: Payer: Self-pay | Admitting: Interventional Cardiology

## 2016-04-13 NOTE — Patient Outreach (Signed)
I spoke to Mrs. Elders today who report's her husband is doing very well. They have decided to return to live in the state of [place] this fall so they are preparing to move. Mr. Frary weight is stable today, it was 170.8. He denies any SOB more than usual or edema. He is taking all his medications. He will see his primary care provider this week. Mrs. Palmeri has already made arrangements for him to see his old MD in [place], a cardiologist and nephrologist.  I encouraged them to call me if there are any problems.  I will call them again next Tuesday. 04/21/16.  Deloria Lair [facility] [place] [phone]

## 2016-04-13 NOTE — Telephone Encounter (Signed)
New Message   *STAT* If patient is at the pharmacy, call can be transferred to refill team.   1. Which medications need to be refilled? (please list name of each medication and dose if known) Xarelto  2. Which pharmacy/location (including street and city if local pharmacy) is medication to be sent to? [facility], [place]  3. Do they need a 30 day or 90 day supply? 30  Pt c/o medication issue:  1. Name of Medication: Xarelto  2. How are you currently taking this medication (dosage and times per day)? One per day, 15 mg  3. Are you having a reaction (difficulty breathing--STAT)? No  4. What is your medication issue? Pts wife is calling due to [facility] will only pay for 35 pills for 90 days. Pts wife stated they only have less than 30 pills and would like for a nurse to contact them.

## 2016-04-14 ENCOUNTER — Other Ambulatory Visit: Payer: Self-pay

## 2016-04-14 ENCOUNTER — Telehealth: Payer: Self-pay

## 2016-04-14 MED ORDER — RIVAROXABAN 15 MG PO TABS: 15.0000 mg | ORAL_TABLET | Freq: Every day | ORAL | 1 refills | Status: DC

## 2016-04-14 NOTE — Telephone Encounter (Signed)
Prior auth for Xarelto 15mg  submitted to [facility].

## 2016-04-14 NOTE — Telephone Encounter (Signed)
Prior auth for Xarelto submitted to [facility], but was not needed. It is a covered under a Tier 3 benefit. Wife advised of this.

## 2016-04-15 DIAGNOSIS — I429 Cardiomyopathy, unspecified: Secondary | ICD-10-CM | POA: Diagnosis not present

## 2016-04-15 DIAGNOSIS — I4891 Unspecified atrial fibrillation: Secondary | ICD-10-CM | POA: Diagnosis not present

## 2016-04-21 ENCOUNTER — Ambulatory Visit: Payer: Medicare Other | Admitting: *Deleted

## 2016-04-21 ENCOUNTER — Other Ambulatory Visit: Payer: Self-pay | Admitting: *Deleted

## 2016-04-21 NOTE — Patient Outreach (Signed)
Transition of care call. Patrick Boyle reports Patrick Boyle is doing well. He continues to feel stronger every day. He has seen his primary care provider and had labs done. He and his wife are planning on moving back to the state of [place] in October. Patrick Boyle has MD appts all set up for him. His weight is stable. He denies SOB and doesn't have any edema. His glucose levels are usually <150.  I have encouraged pt to call if any problems. I will check in on him again in one week.  Patrick Boyle [facility] [place] [phone]

## 2016-04-22 NOTE — Patient Outreach (Signed)
See previous note from 04/21/16.

## 2016-04-24 ENCOUNTER — Other Ambulatory Visit: Payer: Self-pay | Admitting: *Deleted

## 2016-04-24 NOTE — Telephone Encounter (Signed)
Wanted to verify if ok to send this script in under Dr Irish Lack. He has filled it locally this month, but per call from [facility] mail order pharmacy patient is moving to another stated and would like a long term rx. Per discharge summary-started rivaroxaban 15 mg daily--case discussed with Dr. Benancio Deeds need close follow up on renal function--if worsens, will need to d/c and switch to warfarin.  Also patient did not have a post-hospital follow up appointment here and he is not due to see Dr Irish Lack until June 2018.  Please advise. Thanks, MI

## 2016-04-27 MED ORDER — RIVAROXABAN 15 MG PO TABS: 15.0000 mg | ORAL_TABLET | Freq: Every day | ORAL | 3 refills | Status: AC

## 2016-04-27 NOTE — Telephone Encounter (Signed)
Can give refills up to a year past his last OV with Dr Irish Lack. It was not recommended at his hosp d/c on 04/05/16 to f/u with Cardiology so his PCP is more than likely following the pts renal function. Thanks.

## 2016-04-28 ENCOUNTER — Other Ambulatory Visit: Payer: Self-pay | Admitting: *Deleted

## 2016-04-28 NOTE — Patient Outreach (Signed)
Transition of care call. Mrs. Pesch reports her husband is doing well. He has seen his primary provider who has told him he can cut back on his furosemide to every other day, however, pt has been having some SOB more than usual on the days that he doesn't take the furosemide. He has to lie on several pillows to get his breath or sit up altogether. He does not have any peripheral edema. He is drinking some tonic water to help with muscle cramping which has been helpful.  I will see pt at his home next week for his discharge visit.  Deloria Lair [facility] [place] [phone]

## 2016-05-05 ENCOUNTER — Other Ambulatory Visit: Payer: Self-pay | Admitting: *Deleted

## 2016-05-05 ENCOUNTER — Ambulatory Visit: Payer: Medicare Other | Admitting: *Deleted

## 2016-05-05 ENCOUNTER — Ambulatory Visit: Payer: Self-pay | Admitting: *Deleted

## 2016-05-05 NOTE — Patient Outreach (Signed)
Pt is stable. Weight is stable, no SOB or edema. I will see him next week at his home for his final transition of care assessment.  Deloria Lair [facility] [place] [phone]

## 2016-05-06 DIAGNOSIS — I5022 Chronic systolic (congestive) heart failure: Secondary | ICD-10-CM | POA: Diagnosis not present

## 2016-05-06 DIAGNOSIS — I259 Chronic ischemic heart disease, unspecified: Secondary | ICD-10-CM | POA: Diagnosis not present

## 2016-05-06 DIAGNOSIS — D631 Anemia in chronic kidney disease: Secondary | ICD-10-CM | POA: Diagnosis not present

## 2016-05-06 DIAGNOSIS — E039 Hypothyroidism, unspecified: Secondary | ICD-10-CM | POA: Diagnosis not present

## 2016-05-06 DIAGNOSIS — I700 Atherosclerosis of aorta: Secondary | ICD-10-CM | POA: Diagnosis not present

## 2016-05-06 DIAGNOSIS — Z95810 Presence of automatic (implantable) cardiac defibrillator: Secondary | ICD-10-CM | POA: Diagnosis not present

## 2016-05-06 DIAGNOSIS — Z23 Encounter for immunization: Secondary | ICD-10-CM | POA: Diagnosis not present

## 2016-05-06 DIAGNOSIS — I129 Hypertensive chronic kidney disease with stage 1 through stage 4 chronic kidney disease, or unspecified chronic kidney disease: Secondary | ICD-10-CM | POA: Diagnosis not present

## 2016-05-06 DIAGNOSIS — N2581 Secondary hyperparathyroidism of renal origin: Secondary | ICD-10-CM | POA: Diagnosis not present

## 2016-05-06 DIAGNOSIS — E782 Mixed hyperlipidemia: Secondary | ICD-10-CM | POA: Diagnosis not present

## 2016-05-06 DIAGNOSIS — E1142 Type 2 diabetes mellitus with diabetic polyneuropathy: Secondary | ICD-10-CM | POA: Diagnosis not present

## 2016-05-06 DIAGNOSIS — Z7984 Long term (current) use of oral hypoglycemic drugs: Secondary | ICD-10-CM | POA: Diagnosis not present

## 2016-05-08 DIAGNOSIS — N189 Chronic kidney disease, unspecified: Secondary | ICD-10-CM | POA: Diagnosis not present

## 2016-05-08 DIAGNOSIS — N184 Chronic kidney disease, stage 4 (severe): Secondary | ICD-10-CM | POA: Diagnosis not present

## 2016-05-08 DIAGNOSIS — I2510 Atherosclerotic heart disease of native coronary artery without angina pectoris: Secondary | ICD-10-CM | POA: Diagnosis not present

## 2016-05-08 DIAGNOSIS — N2581 Secondary hyperparathyroidism of renal origin: Secondary | ICD-10-CM | POA: Diagnosis not present

## 2016-05-08 DIAGNOSIS — E1122 Type 2 diabetes mellitus with diabetic chronic kidney disease: Secondary | ICD-10-CM | POA: Diagnosis not present

## 2016-05-08 DIAGNOSIS — N320 Bladder-neck obstruction: Secondary | ICD-10-CM | POA: Diagnosis not present

## 2016-05-08 DIAGNOSIS — E1140 Type 2 diabetes mellitus with diabetic neuropathy, unspecified: Secondary | ICD-10-CM | POA: Diagnosis not present

## 2016-05-08 DIAGNOSIS — F0390 Unspecified dementia without behavioral disturbance: Secondary | ICD-10-CM | POA: Diagnosis not present

## 2016-05-08 DIAGNOSIS — D631 Anemia in chronic kidney disease: Secondary | ICD-10-CM | POA: Diagnosis not present

## 2016-05-08 DIAGNOSIS — E119 Type 2 diabetes mellitus without complications: Secondary | ICD-10-CM | POA: Diagnosis not present

## 2016-05-08 DIAGNOSIS — I770 Arteriovenous fistula, acquired: Secondary | ICD-10-CM | POA: Diagnosis not present

## 2016-05-08 DIAGNOSIS — I4891 Unspecified atrial fibrillation: Secondary | ICD-10-CM | POA: Diagnosis not present

## 2016-05-12 ENCOUNTER — Encounter: Payer: Self-pay | Admitting: *Deleted

## 2016-05-12 ENCOUNTER — Other Ambulatory Visit: Payer: Self-pay | Admitting: *Deleted

## 2016-05-12 NOTE — Patient Outreach (Addendum)
Patrick Boyle) Care Management   05/12/2016  Patrick Boyle 11-06-1933 [id number]  Patrick Boyle is an 82 y.o. male  Subjective: Patrick Boyle is doing very well. His wt is down, no edema. He still reports SOB on exertion. He recently saw his nephrologist and his furosemide was increased to 40 mg daily and his gabapentin was reduced to 100 mg tid. He sustained a minor injury yesterdat sustaining a skin tear. Patrick Boyle has washed the wound and applied antibiotic ointment.  Objective:   Review of Systems  Constitutional: Negative.   HENT: Negative.   Eyes: Negative.   Respiratory: Negative.   Cardiovascular: Negative.   Gastrointestinal: Negative.   Genitourinary: Negative.   Musculoskeletal: Negative.   Skin:       Skin tear L arm.  Neurological: Negative.   Endo/Heme/Allergies: Bruises/bleeds easily.  Psychiatric/Behavioral: Negative.    BP (!) 130/54 (BP Location: Left Arm, Patient Position: Sitting, Cuff Size: Normal)   Pulse (!) 54   Resp 16   Wt 170 lb (77.1 kg)   SpO2 97%   BMI 23.71 kg/m   Physical Exam  Constitutional: He is oriented to person, place, and time. He appears well-developed and well-nourished.  HENT:  Head: Normocephalic and atraumatic.  Neck: Normal range of motion. Neck supple.  Cardiovascular: Normal rate and regular rhythm.   Respiratory: Effort normal and breath sounds normal.  GI: Soft. Bowel sounds are normal.  Musculoskeletal:  Limited ROM, recent L hip replacement.  Neurological: He is alert and oriented to person, place, and time.  Skin: Skin is warm and dry.  Psychiatric: He has a normal mood and affect.    Encounter Medications:   Outpatient Encounter Prescriptions as of 05/12/2016  Medication Sig  . acetaminophen (TYLENOL 8 HOUR ARTHRITIS PAIN) 650 MG CR tablet Take 650 mg by mouth every 8 (eight) hours as needed for pain.  . bimatoprost (LUMIGAN) 0.03 % ophthalmic solution Place 1 drop into both eyes at bedtime.   .  calcitRIOL (ROCALTROL) 0.25 MCG capsule Take 0.25 mcg by mouth daily.  . cetirizine (ZYRTEC) 10 MG tablet Take 10 mg by mouth daily.   . cholecalciferol (VITAMIN D) 1000 UNITS tablet Take 1,000 Units by mouth daily.  Marland Kitchen donepezil (ARICEPT) 10 MG tablet Take 10 mg by mouth at bedtime.  . furosemide (LASIX) 40 MG tablet Take 40 mg by mouth daily.  Marland Kitchen gabapentin (NEURONTIN) 100 MG capsule Take 100 mg by mouth 3 (three) times daily.  . Insulin Glargine (TOUJEO SOLOSTAR) 300 UNIT/ML SOPN Inject 15-30 Units into the skin at bedtime.   . lansoprazole (PREVACID) 30 MG capsule Take 30 mg by mouth daily as needed (For acid reflux.).   Marland Kitchen levothyroxine (SYNTHROID, LEVOTHROID) 50 MCG tablet Take 50 mcg by mouth daily before breakfast.  . Meclizine HCl (ANTIVERT PO) Take 5 mg by mouth as needed (VERTIGO).  . Rivaroxaban (XARELTO) 15 MG TABS tablet Take 1 tablet (15 mg total) by mouth daily with supper.  . simvastatin (ZOCOR) 40 MG tablet Take 40 mg by mouth every morning.   . sitaGLIPtin (JANUVIA) 25 MG tablet Take 25 mg by mouth daily with breakfast.   . sodium bicarbonate 650 MG tablet Take 1 tablet (650 mg total) by mouth 2 (two) times daily.  . tamsulosin (FLOMAX) 0.4 MG CAPS capsule Take 1 capsule (0.4 mg total) by mouth daily after supper.  . traMADol (ULTRAM) 50 MG tablet Take 50 mg by mouth every 6 (six) hours as needed.  for pain  . [DISCONTINUED] furosemide (LASIX) 20 MG tablet Take 1 tablet (20 mg total) by mouth daily.  . [DISCONTINUED] gabapentin (NEURONTIN) 300 MG capsule Take 300 mg by mouth 2 (two) times daily.    No facility-administered encounter medications on file as of 05/12/2016.    Functional Status:   In your present state of health, do you have any difficulty performing the following activities: 04/06/2016 04/04/2016  Hearing? Tempie Donning  Vision? N N  Difficulty concentrating or making decisions? N N  Walking or climbing stairs? Y Y  Dressing or bathing? N N  Doing errands, shopping? Tempie Donning   Preparing Food and eating ? N -  Using the Toilet? N -  In the past six months, have you accidently leaked urine? N -  Do you have problems with loss of bowel control? N -  Managing your Medications? N -  Managing your Finances? N -  Housekeeping or managing your Housekeeping? Y -  Some recent data might be hidden    Fall/Depression Screening:    PHQ 2/9 Scores 04/06/2016 12/25/2015 12/18/2015 02/19/2015  PHQ - 2 Score 1 1 1 1    THN CM Care Plan Problem One   Flowsheet Row Most Recent Value  Care Plan Problem One  (P) New diagnosis of CHF  Role Documenting the Problem One  (P) Care Management [place] for Problem One  (P) Active  THN Long Term Goal (31-90 days)  (P) Pt will not have a readmission for HF over the next 31 days.  THN Long Term Goal Start Date  (P) 04/06/16  Interventions for Problem One Long Term Goal  (P) Advised that pt should go back to taking his furosemide 20 mg daily.  THN CM Short Term Goal #1 (0-30 days)  (P) Will will weigh daily and record this on the calendar over the next month..  THN CM Short Term Goal #1 Start Date  (P) 04/06/16  Interventions for Short Term Goal #1  (P) Continue daily weights ongoing, this is something that must be done daily as long as he lives and can stand on the scales.  THN CM Short Term Goal #2 (0-30 days)  (P) Pt will call NP if he has sxs in the yellow zone over the next month.  THN CM Short Term Goal #2 Start Date  (P) 04/06/16  Interventions for Short Term Goal #2  (P) Encouraged pt or wife to call if problems arise.  THN CM Short Term Goal #3 (0-30 days)  (P) Pt will review the Emmi programs assigned over the next 30 days.  THN CM Short Term Goal #3 Start Date  (P) 04/06/16  Interventions for Short Tern Goal #3  (P) Homework is to finsih reading Emmi programs and discuss next week.      Assessment:  CHF stable                          CKD                          Skin tear            Plan:  This is our last  meeting. Pt is moving to [place] next month.            Reinforced disease management daily activities.            Suggest soap and water  for wound cleansing, get nonstick pads and cut to size, get conform bandage to wrap his arm in. Change                 bandage daily. Use less antibiotic ointment when you obtain the nonstick bandages. Gave steristrips for use if has future skin                 tears.              It has been a pleasure looking after Patrick Boyle, Patrick Boyle really enjoyed them!  Deloria Lair [facility] [place] [phone]

## 2016-05-19 DIAGNOSIS — L84 Corns and callosities: Secondary | ICD-10-CM | POA: Diagnosis not present

## 2016-05-19 DIAGNOSIS — S51819A Laceration without foreign body of unspecified forearm, initial encounter: Secondary | ICD-10-CM | POA: Diagnosis not present

## 2016-05-21 ENCOUNTER — Ambulatory Visit: Payer: Medicare Other | Admitting: Podiatry

## 2016-05-22 DIAGNOSIS — N401 Enlarged prostate with lower urinary tract symptoms: Secondary | ICD-10-CM | POA: Diagnosis not present

## 2016-05-22 DIAGNOSIS — R3914 Feeling of incomplete bladder emptying: Secondary | ICD-10-CM | POA: Diagnosis not present

## 2016-06-10 ENCOUNTER — Telehealth: Payer: Self-pay | Admitting: Cardiology

## 2016-06-10 NOTE — Telephone Encounter (Signed)
Attempted to call pt and request a manual transmisison b/c his home monitor has not updated in at least 8 days. No answer and unable to leave a message.

## 2016-06-15 ENCOUNTER — Ambulatory Visit (INDEPENDENT_AMBULATORY_CARE_PROVIDER_SITE_OTHER): Payer: Medicare Other | Admitting: *Deleted

## 2016-06-15 DIAGNOSIS — I255 Ischemic cardiomyopathy: Secondary | ICD-10-CM

## 2016-06-15 NOTE — Progress Notes (Signed)
Remote ICD transmission.   

## 2016-06-17 ENCOUNTER — Encounter: Payer: Self-pay | Admitting: Cardiology

## 2016-06-19 DIAGNOSIS — E785 Hyperlipidemia, unspecified: Secondary | ICD-10-CM | POA: Diagnosis not present

## 2016-06-19 DIAGNOSIS — N312 Flaccid neuropathic bladder, not elsewhere classified: Secondary | ICD-10-CM | POA: Diagnosis not present

## 2016-06-19 DIAGNOSIS — R413 Other amnesia: Secondary | ICD-10-CM | POA: Diagnosis not present

## 2016-06-19 DIAGNOSIS — I10 Essential (primary) hypertension: Secondary | ICD-10-CM | POA: Diagnosis not present

## 2016-06-19 DIAGNOSIS — M1712 Unilateral primary osteoarthritis, left knee: Secondary | ICD-10-CM | POA: Diagnosis not present

## 2016-06-19 DIAGNOSIS — M1711 Unilateral primary osteoarthritis, right knee: Secondary | ICD-10-CM | POA: Diagnosis not present

## 2016-06-19 DIAGNOSIS — E119 Type 2 diabetes mellitus without complications: Secondary | ICD-10-CM | POA: Diagnosis not present

## 2016-06-19 DIAGNOSIS — E039 Hypothyroidism, unspecified: Secondary | ICD-10-CM | POA: Diagnosis not present

## 2016-06-19 DIAGNOSIS — N184 Chronic kidney disease, stage 4 (severe): Secondary | ICD-10-CM | POA: Diagnosis not present

## 2016-06-19 DIAGNOSIS — F419 Anxiety disorder, unspecified: Secondary | ICD-10-CM | POA: Diagnosis not present

## 2016-06-19 DIAGNOSIS — I2510 Atherosclerotic heart disease of native coronary artery without angina pectoris: Secondary | ICD-10-CM | POA: Diagnosis not present

## 2016-06-19 DIAGNOSIS — K219 Gastro-esophageal reflux disease without esophagitis: Secondary | ICD-10-CM | POA: Diagnosis not present

## 2016-06-23 DIAGNOSIS — Z951 Presence of aortocoronary bypass graft: Secondary | ICD-10-CM | POA: Diagnosis not present

## 2016-06-23 DIAGNOSIS — Z95810 Presence of automatic (implantable) cardiac defibrillator: Secondary | ICD-10-CM | POA: Diagnosis not present

## 2016-06-23 DIAGNOSIS — I2510 Atherosclerotic heart disease of native coronary artery without angina pectoris: Secondary | ICD-10-CM | POA: Diagnosis not present

## 2016-06-23 DIAGNOSIS — E784 Other hyperlipidemia: Secondary | ICD-10-CM | POA: Diagnosis not present

## 2016-06-23 DIAGNOSIS — I483 Typical atrial flutter: Secondary | ICD-10-CM | POA: Diagnosis not present

## 2016-06-23 DIAGNOSIS — I252 Old myocardial infarction: Secondary | ICD-10-CM | POA: Diagnosis not present

## 2016-06-26 LAB — CUP PACEART REMOTE DEVICE CHECK
Battery Remaining Longevity: 66 mo
Battery Remaining Percentage: 57 %
Battery Voltage: 2.95 V
Brady Statistic RV Percent Paced: 3.6 %
Date Time Interrogation Session: 20171016080016
HIGH POWER IMPEDANCE MEASURED VALUE: 60 Ohm
HighPow Impedance: 60 Ohm
Lead Channel Impedance Value: 350 Ohm
Lead Channel Pacing Threshold Amplitude: 1.25 V
Lead Channel Sensing Intrinsic Amplitude: 12 mV
Lead Channel Setting Pacing Amplitude: 2.5 V
MDC IDC LEAD IMPLANT DT: 20121011
MDC IDC LEAD LOCATION: 753860
MDC IDC MSMT LEADCHNL RV PACING THRESHOLD PULSEWIDTH: 0.5 ms
MDC IDC PG SERIAL: 1016528
MDC IDC SET LEADCHNL RV PACING PULSEWIDTH: 0.5 ms
MDC IDC SET LEADCHNL RV SENSING SENSITIVITY: 0.5 mV

## 2016-07-03 DIAGNOSIS — H401132 Primary open-angle glaucoma, bilateral, moderate stage: Secondary | ICD-10-CM | POA: Diagnosis not present

## 2016-07-03 DIAGNOSIS — E119 Type 2 diabetes mellitus without complications: Secondary | ICD-10-CM | POA: Diagnosis not present

## 2016-07-07 DIAGNOSIS — E039 Hypothyroidism, unspecified: Secondary | ICD-10-CM | POA: Diagnosis not present

## 2016-07-07 DIAGNOSIS — E119 Type 2 diabetes mellitus without complications: Secondary | ICD-10-CM | POA: Diagnosis not present

## 2016-07-07 DIAGNOSIS — N184 Chronic kidney disease, stage 4 (severe): Secondary | ICD-10-CM | POA: Diagnosis not present

## 2016-07-13 DIAGNOSIS — Z95810 Presence of automatic (implantable) cardiac defibrillator: Secondary | ICD-10-CM | POA: Diagnosis not present

## 2016-07-13 DIAGNOSIS — Z951 Presence of aortocoronary bypass graft: Secondary | ICD-10-CM | POA: Diagnosis not present

## 2016-07-13 DIAGNOSIS — I483 Typical atrial flutter: Secondary | ICD-10-CM | POA: Diagnosis not present

## 2016-07-13 DIAGNOSIS — I252 Old myocardial infarction: Secondary | ICD-10-CM | POA: Diagnosis not present

## 2016-07-22 DIAGNOSIS — D649 Anemia, unspecified: Secondary | ICD-10-CM | POA: Diagnosis not present

## 2016-07-28 DIAGNOSIS — E1121 Type 2 diabetes mellitus with diabetic nephropathy: Secondary | ICD-10-CM | POA: Diagnosis not present

## 2016-07-28 DIAGNOSIS — D631 Anemia in chronic kidney disease: Secondary | ICD-10-CM | POA: Diagnosis not present

## 2016-07-28 DIAGNOSIS — N184 Chronic kidney disease, stage 4 (severe): Secondary | ICD-10-CM | POA: Diagnosis not present

## 2016-07-28 DIAGNOSIS — I129 Hypertensive chronic kidney disease with stage 1 through stage 4 chronic kidney disease, or unspecified chronic kidney disease: Secondary | ICD-10-CM | POA: Diagnosis not present

## 2016-08-04 DIAGNOSIS — I10 Essential (primary) hypertension: Secondary | ICD-10-CM | POA: Diagnosis not present

## 2016-08-04 DIAGNOSIS — I420 Dilated cardiomyopathy: Secondary | ICD-10-CM | POA: Diagnosis not present

## 2016-08-04 DIAGNOSIS — I2510 Atherosclerotic heart disease of native coronary artery without angina pectoris: Secondary | ICD-10-CM | POA: Diagnosis not present

## 2016-08-04 DIAGNOSIS — E784 Other hyperlipidemia: Secondary | ICD-10-CM | POA: Diagnosis not present

## 2016-08-04 DIAGNOSIS — I483 Typical atrial flutter: Secondary | ICD-10-CM | POA: Diagnosis not present

## 2016-08-04 DIAGNOSIS — Z7901 Long term (current) use of anticoagulants: Secondary | ICD-10-CM | POA: Diagnosis not present

## 2016-08-13 DIAGNOSIS — E1142 Type 2 diabetes mellitus with diabetic polyneuropathy: Secondary | ICD-10-CM | POA: Diagnosis not present

## 2016-08-26 DIAGNOSIS — K219 Gastro-esophageal reflux disease without esophagitis: Secondary | ICD-10-CM | POA: Diagnosis not present

## 2016-08-26 DIAGNOSIS — J449 Chronic obstructive pulmonary disease, unspecified: Secondary | ICD-10-CM | POA: Diagnosis not present

## 2016-08-26 DIAGNOSIS — Z87891 Personal history of nicotine dependence: Secondary | ICD-10-CM | POA: Diagnosis not present

## 2016-08-26 DIAGNOSIS — I4892 Unspecified atrial flutter: Secondary | ICD-10-CM | POA: Diagnosis not present

## 2016-08-26 DIAGNOSIS — N184 Chronic kidney disease, stage 4 (severe): Secondary | ICD-10-CM | POA: Diagnosis not present

## 2016-08-26 DIAGNOSIS — R413 Other amnesia: Secondary | ICD-10-CM | POA: Diagnosis not present

## 2016-08-26 DIAGNOSIS — I2510 Atherosclerotic heart disease of native coronary artery without angina pectoris: Secondary | ICD-10-CM | POA: Diagnosis not present

## 2016-08-26 DIAGNOSIS — I517 Cardiomegaly: Secondary | ICD-10-CM | POA: Diagnosis not present

## 2016-08-26 DIAGNOSIS — M1990 Unspecified osteoarthritis, unspecified site: Secondary | ICD-10-CM | POA: Diagnosis not present

## 2016-08-26 DIAGNOSIS — R042 Hemoptysis: Secondary | ICD-10-CM | POA: Diagnosis not present

## 2016-08-26 DIAGNOSIS — E119 Type 2 diabetes mellitus without complications: Secondary | ICD-10-CM | POA: Diagnosis not present

## 2016-09-08 DIAGNOSIS — I4892 Unspecified atrial flutter: Secondary | ICD-10-CM | POA: Diagnosis not present

## 2016-09-08 DIAGNOSIS — J90 Pleural effusion, not elsewhere classified: Secondary | ICD-10-CM | POA: Diagnosis not present

## 2016-09-08 DIAGNOSIS — J432 Centrilobular emphysema: Secondary | ICD-10-CM | POA: Diagnosis not present

## 2016-09-14 ENCOUNTER — Telehealth: Payer: Self-pay | Admitting: Cardiology

## 2016-09-14 ENCOUNTER — Encounter: Payer: Medicare Other | Admitting: *Deleted

## 2016-09-14 NOTE — Telephone Encounter (Signed)
Number has been disconnected.

## 2016-09-24 DIAGNOSIS — Z95810 Presence of automatic (implantable) cardiac defibrillator: Secondary | ICD-10-CM | POA: Diagnosis not present

## 2016-09-24 DIAGNOSIS — I255 Ischemic cardiomyopathy: Secondary | ICD-10-CM | POA: Diagnosis not present

## 2016-09-24 DIAGNOSIS — I4892 Unspecified atrial flutter: Secondary | ICD-10-CM | POA: Diagnosis not present

## 2016-09-24 DIAGNOSIS — Z7901 Long term (current) use of anticoagulants: Secondary | ICD-10-CM | POA: Diagnosis not present

## 2016-09-28 DIAGNOSIS — E785 Hyperlipidemia, unspecified: Secondary | ICD-10-CM | POA: Diagnosis not present

## 2016-09-28 DIAGNOSIS — Z23 Encounter for immunization: Secondary | ICD-10-CM | POA: Diagnosis not present

## 2016-09-28 DIAGNOSIS — E119 Type 2 diabetes mellitus without complications: Secondary | ICD-10-CM | POA: Diagnosis not present

## 2016-09-28 DIAGNOSIS — Z87891 Personal history of nicotine dependence: Secondary | ICD-10-CM | POA: Diagnosis not present

## 2016-09-28 DIAGNOSIS — E039 Hypothyroidism, unspecified: Secondary | ICD-10-CM | POA: Diagnosis not present

## 2016-09-28 DIAGNOSIS — N184 Chronic kidney disease, stage 4 (severe): Secondary | ICD-10-CM | POA: Diagnosis not present

## 2016-09-28 DIAGNOSIS — I4892 Unspecified atrial flutter: Secondary | ICD-10-CM | POA: Diagnosis not present

## 2016-09-28 DIAGNOSIS — R413 Other amnesia: Secondary | ICD-10-CM | POA: Diagnosis not present

## 2016-09-30 DIAGNOSIS — R040 Epistaxis: Secondary | ICD-10-CM | POA: Diagnosis not present

## 2016-09-30 DIAGNOSIS — R042 Hemoptysis: Secondary | ICD-10-CM | POA: Diagnosis not present

## 2016-09-30 DIAGNOSIS — R413 Other amnesia: Secondary | ICD-10-CM | POA: Diagnosis not present

## 2016-10-15 DIAGNOSIS — Z951 Presence of aortocoronary bypass graft: Secondary | ICD-10-CM | POA: Diagnosis not present

## 2016-10-15 DIAGNOSIS — Z794 Long term (current) use of insulin: Secondary | ICD-10-CM | POA: Diagnosis not present

## 2016-10-15 DIAGNOSIS — R413 Other amnesia: Secondary | ICD-10-CM | POA: Diagnosis not present

## 2016-10-15 DIAGNOSIS — E782 Mixed hyperlipidemia: Secondary | ICD-10-CM | POA: Diagnosis not present

## 2016-10-15 DIAGNOSIS — R042 Hemoptysis: Secondary | ICD-10-CM | POA: Diagnosis not present

## 2016-10-15 DIAGNOSIS — Z87891 Personal history of nicotine dependence: Secondary | ICD-10-CM | POA: Diagnosis not present

## 2016-10-15 DIAGNOSIS — D649 Anemia, unspecified: Secondary | ICD-10-CM | POA: Diagnosis not present

## 2016-10-15 DIAGNOSIS — E1122 Type 2 diabetes mellitus with diabetic chronic kidney disease: Secondary | ICD-10-CM | POA: Diagnosis not present

## 2016-10-15 DIAGNOSIS — R848 Other abnormal findings in specimens from respiratory organs and thorax: Secondary | ICD-10-CM | POA: Diagnosis not present

## 2016-10-15 DIAGNOSIS — N184 Chronic kidney disease, stage 4 (severe): Secondary | ICD-10-CM | POA: Diagnosis not present

## 2016-10-15 DIAGNOSIS — B9689 Other specified bacterial agents as the cause of diseases classified elsewhere: Secondary | ICD-10-CM | POA: Diagnosis not present

## 2016-10-15 DIAGNOSIS — I129 Hypertensive chronic kidney disease with stage 1 through stage 4 chronic kidney disease, or unspecified chronic kidney disease: Secondary | ICD-10-CM | POA: Diagnosis not present

## 2016-10-23 DIAGNOSIS — R040 Epistaxis: Secondary | ICD-10-CM | POA: Diagnosis not present

## 2016-10-23 DIAGNOSIS — J209 Acute bronchitis, unspecified: Secondary | ICD-10-CM | POA: Diagnosis not present

## 2016-10-23 DIAGNOSIS — R042 Hemoptysis: Secondary | ICD-10-CM | POA: Diagnosis not present

## 2016-10-28 DIAGNOSIS — M2042 Other hammer toe(s) (acquired), left foot: Secondary | ICD-10-CM | POA: Diagnosis not present

## 2016-10-28 DIAGNOSIS — E1140 Type 2 diabetes mellitus with diabetic neuropathy, unspecified: Secondary | ICD-10-CM | POA: Diagnosis not present

## 2016-10-28 DIAGNOSIS — M2041 Other hammer toe(s) (acquired), right foot: Secondary | ICD-10-CM | POA: Diagnosis not present

## 2016-10-28 DIAGNOSIS — L600 Ingrowing nail: Secondary | ICD-10-CM | POA: Diagnosis not present

## 2016-11-06 DIAGNOSIS — H6123 Impacted cerumen, bilateral: Secondary | ICD-10-CM | POA: Diagnosis not present

## 2016-11-06 DIAGNOSIS — R042 Hemoptysis: Secondary | ICD-10-CM | POA: Diagnosis not present

## 2016-11-06 DIAGNOSIS — Z7901 Long term (current) use of anticoagulants: Secondary | ICD-10-CM | POA: Diagnosis not present

## 2016-12-08 DIAGNOSIS — N390 Urinary tract infection, site not specified: Secondary | ICD-10-CM | POA: Diagnosis not present

## 2016-12-08 DIAGNOSIS — R310 Gross hematuria: Secondary | ICD-10-CM | POA: Diagnosis not present

## 2016-12-08 DIAGNOSIS — N184 Chronic kidney disease, stage 4 (severe): Secondary | ICD-10-CM | POA: Diagnosis not present

## 2016-12-08 DIAGNOSIS — E119 Type 2 diabetes mellitus without complications: Secondary | ICD-10-CM | POA: Diagnosis not present

## 2016-12-08 DIAGNOSIS — R319 Hematuria, unspecified: Secondary | ICD-10-CM | POA: Diagnosis not present

## 2016-12-08 DIAGNOSIS — R39198 Other difficulties with micturition: Secondary | ICD-10-CM | POA: Diagnosis not present

## 2016-12-08 DIAGNOSIS — N401 Enlarged prostate with lower urinary tract symptoms: Secondary | ICD-10-CM | POA: Diagnosis not present

## 2016-12-14 DIAGNOSIS — D631 Anemia in chronic kidney disease: Secondary | ICD-10-CM | POA: Diagnosis not present

## 2016-12-14 DIAGNOSIS — N2581 Secondary hyperparathyroidism of renal origin: Secondary | ICD-10-CM | POA: Diagnosis not present

## 2016-12-14 DIAGNOSIS — N184 Chronic kidney disease, stage 4 (severe): Secondary | ICD-10-CM | POA: Diagnosis not present

## 2016-12-14 DIAGNOSIS — E1121 Type 2 diabetes mellitus with diabetic nephropathy: Secondary | ICD-10-CM | POA: Diagnosis not present

## 2016-12-14 DIAGNOSIS — I129 Hypertensive chronic kidney disease with stage 1 through stage 4 chronic kidney disease, or unspecified chronic kidney disease: Secondary | ICD-10-CM | POA: Diagnosis not present

## 2016-12-24 DIAGNOSIS — I4892 Unspecified atrial flutter: Secondary | ICD-10-CM | POA: Diagnosis not present

## 2016-12-24 DIAGNOSIS — Z95810 Presence of automatic (implantable) cardiac defibrillator: Secondary | ICD-10-CM | POA: Diagnosis not present

## 2016-12-24 DIAGNOSIS — I255 Ischemic cardiomyopathy: Secondary | ICD-10-CM | POA: Diagnosis not present

## 2016-12-31 DIAGNOSIS — N281 Cyst of kidney, acquired: Secondary | ICD-10-CM | POA: Diagnosis not present

## 2016-12-31 DIAGNOSIS — N189 Chronic kidney disease, unspecified: Secondary | ICD-10-CM | POA: Diagnosis not present

## 2016-12-31 DIAGNOSIS — N3289 Other specified disorders of bladder: Secondary | ICD-10-CM | POA: Diagnosis not present

## 2017-01-22 DIAGNOSIS — I10 Essential (primary) hypertension: Secondary | ICD-10-CM | POA: Diagnosis not present

## 2017-01-22 DIAGNOSIS — E119 Type 2 diabetes mellitus without complications: Secondary | ICD-10-CM | POA: Diagnosis not present

## 2017-01-22 DIAGNOSIS — E039 Hypothyroidism, unspecified: Secondary | ICD-10-CM | POA: Diagnosis not present

## 2017-02-02 DIAGNOSIS — Z951 Presence of aortocoronary bypass graft: Secondary | ICD-10-CM | POA: Diagnosis not present

## 2017-02-02 DIAGNOSIS — I10 Essential (primary) hypertension: Secondary | ICD-10-CM | POA: Diagnosis not present

## 2017-02-02 DIAGNOSIS — Z7901 Long term (current) use of anticoagulants: Secondary | ICD-10-CM | POA: Diagnosis not present

## 2017-02-02 DIAGNOSIS — I2510 Atherosclerotic heart disease of native coronary artery without angina pectoris: Secondary | ICD-10-CM | POA: Diagnosis not present

## 2017-02-02 DIAGNOSIS — I483 Typical atrial flutter: Secondary | ICD-10-CM | POA: Diagnosis not present

## 2017-02-02 DIAGNOSIS — I255 Ischemic cardiomyopathy: Secondary | ICD-10-CM | POA: Diagnosis not present

## 2017-02-08 DIAGNOSIS — E1121 Type 2 diabetes mellitus with diabetic nephropathy: Secondary | ICD-10-CM | POA: Diagnosis not present

## 2017-02-08 DIAGNOSIS — I129 Hypertensive chronic kidney disease with stage 1 through stage 4 chronic kidney disease, or unspecified chronic kidney disease: Secondary | ICD-10-CM | POA: Diagnosis not present

## 2017-02-08 DIAGNOSIS — N401 Enlarged prostate with lower urinary tract symptoms: Secondary | ICD-10-CM | POA: Diagnosis not present

## 2017-02-08 DIAGNOSIS — N184 Chronic kidney disease, stage 4 (severe): Secondary | ICD-10-CM | POA: Diagnosis not present

## 2017-02-08 DIAGNOSIS — N2581 Secondary hyperparathyroidism of renal origin: Secondary | ICD-10-CM | POA: Diagnosis not present

## 2017-02-08 DIAGNOSIS — M1990 Unspecified osteoarthritis, unspecified site: Secondary | ICD-10-CM | POA: Diagnosis not present

## 2017-02-08 DIAGNOSIS — N320 Bladder-neck obstruction: Secondary | ICD-10-CM | POA: Diagnosis not present

## 2017-02-08 DIAGNOSIS — I2510 Atherosclerotic heart disease of native coronary artery without angina pectoris: Secondary | ICD-10-CM | POA: Diagnosis not present

## 2017-02-08 DIAGNOSIS — D631 Anemia in chronic kidney disease: Secondary | ICD-10-CM | POA: Diagnosis not present

## 2017-03-29 DIAGNOSIS — Z95810 Presence of automatic (implantable) cardiac defibrillator: Secondary | ICD-10-CM | POA: Diagnosis not present

## 2017-03-29 DIAGNOSIS — I255 Ischemic cardiomyopathy: Secondary | ICD-10-CM | POA: Diagnosis not present

## 2017-04-19 DIAGNOSIS — L600 Ingrowing nail: Secondary | ICD-10-CM | POA: Diagnosis not present

## 2017-04-19 DIAGNOSIS — L03031 Cellulitis of right toe: Secondary | ICD-10-CM | POA: Diagnosis not present

## 2017-04-30 DIAGNOSIS — L03031 Cellulitis of right toe: Secondary | ICD-10-CM | POA: Diagnosis not present

## 2017-04-30 DIAGNOSIS — L600 Ingrowing nail: Secondary | ICD-10-CM | POA: Diagnosis not present

## 2017-05-11 DIAGNOSIS — H6983 Other specified disorders of Eustachian tube, bilateral: Secondary | ICD-10-CM | POA: Diagnosis not present

## 2017-05-11 DIAGNOSIS — H8110 Benign paroxysmal vertigo, unspecified ear: Secondary | ICD-10-CM | POA: Diagnosis not present

## 2017-05-11 DIAGNOSIS — H6123 Impacted cerumen, bilateral: Secondary | ICD-10-CM | POA: Diagnosis not present

## 2017-05-11 DIAGNOSIS — J343 Hypertrophy of nasal turbinates: Secondary | ICD-10-CM | POA: Diagnosis not present

## 2017-05-25 DIAGNOSIS — Z23 Encounter for immunization: Secondary | ICD-10-CM | POA: Diagnosis not present

## 2017-05-25 DIAGNOSIS — I4892 Unspecified atrial flutter: Secondary | ICD-10-CM | POA: Diagnosis not present

## 2017-05-25 DIAGNOSIS — E785 Hyperlipidemia, unspecified: Secondary | ICD-10-CM | POA: Diagnosis not present

## 2017-05-25 DIAGNOSIS — E119 Type 2 diabetes mellitus without complications: Secondary | ICD-10-CM | POA: Diagnosis not present

## 2017-05-25 DIAGNOSIS — E039 Hypothyroidism, unspecified: Secondary | ICD-10-CM | POA: Diagnosis not present

## 2017-05-25 DIAGNOSIS — R413 Other amnesia: Secondary | ICD-10-CM | POA: Diagnosis not present

## 2017-05-25 DIAGNOSIS — R8290 Unspecified abnormal findings in urine: Secondary | ICD-10-CM | POA: Diagnosis not present

## 2017-05-25 DIAGNOSIS — N184 Chronic kidney disease, stage 4 (severe): Secondary | ICD-10-CM | POA: Diagnosis not present

## 2017-05-25 DIAGNOSIS — I10 Essential (primary) hypertension: Secondary | ICD-10-CM | POA: Diagnosis not present

## 2017-05-25 DIAGNOSIS — N312 Flaccid neuropathic bladder, not elsewhere classified: Secondary | ICD-10-CM | POA: Diagnosis not present

## 2017-05-27 DIAGNOSIS — L03031 Cellulitis of right toe: Secondary | ICD-10-CM | POA: Diagnosis not present

## 2017-05-27 DIAGNOSIS — N312 Flaccid neuropathic bladder, not elsewhere classified: Secondary | ICD-10-CM | POA: Diagnosis not present

## 2017-05-27 DIAGNOSIS — L600 Ingrowing nail: Secondary | ICD-10-CM | POA: Diagnosis not present

## 2017-06-03 DIAGNOSIS — M1990 Unspecified osteoarthritis, unspecified site: Secondary | ICD-10-CM | POA: Diagnosis not present

## 2017-06-08 DIAGNOSIS — M1990 Unspecified osteoarthritis, unspecified site: Secondary | ICD-10-CM | POA: Diagnosis not present

## 2017-06-11 DIAGNOSIS — M1990 Unspecified osteoarthritis, unspecified site: Secondary | ICD-10-CM | POA: Diagnosis not present

## 2017-06-15 DIAGNOSIS — N400 Enlarged prostate without lower urinary tract symptoms: Secondary | ICD-10-CM | POA: Diagnosis not present

## 2017-06-15 DIAGNOSIS — R319 Hematuria, unspecified: Secondary | ICD-10-CM | POA: Diagnosis not present

## 2017-06-15 DIAGNOSIS — M1990 Unspecified osteoarthritis, unspecified site: Secondary | ICD-10-CM | POA: Diagnosis not present

## 2017-06-25 DIAGNOSIS — M1990 Unspecified osteoarthritis, unspecified site: Secondary | ICD-10-CM | POA: Diagnosis not present

## 2017-06-28 DIAGNOSIS — H6983 Other specified disorders of Eustachian tube, bilateral: Secondary | ICD-10-CM | POA: Diagnosis not present

## 2017-06-28 DIAGNOSIS — J343 Hypertrophy of nasal turbinates: Secondary | ICD-10-CM | POA: Diagnosis not present

## 2017-06-28 DIAGNOSIS — J342 Deviated nasal septum: Secondary | ICD-10-CM | POA: Diagnosis not present

## 2017-06-29 DIAGNOSIS — M1990 Unspecified osteoarthritis, unspecified site: Secondary | ICD-10-CM | POA: Diagnosis not present

## 2017-07-07 DIAGNOSIS — I255 Ischemic cardiomyopathy: Secondary | ICD-10-CM | POA: Diagnosis not present

## 2017-07-07 DIAGNOSIS — I483 Typical atrial flutter: Secondary | ICD-10-CM | POA: Diagnosis not present

## 2017-07-07 DIAGNOSIS — Z95810 Presence of automatic (implantable) cardiac defibrillator: Secondary | ICD-10-CM | POA: Diagnosis not present

## 2017-07-07 DIAGNOSIS — I2510 Atherosclerotic heart disease of native coronary artery without angina pectoris: Secondary | ICD-10-CM | POA: Diagnosis not present

## 2017-07-07 DIAGNOSIS — I10 Essential (primary) hypertension: Secondary | ICD-10-CM | POA: Diagnosis not present

## 2017-07-10 IMAGING — CR DG CHEST 1V PORT
1 series · 1 of 1 positions shown · non-contrast
Comparison: Portable exam 3339 hours without priors for comparison

CLINICAL DATA: Altered mental status beginning at 5444 hours, fever

EXAM:
PORTABLE CHEST 1 VIEW

[AP]
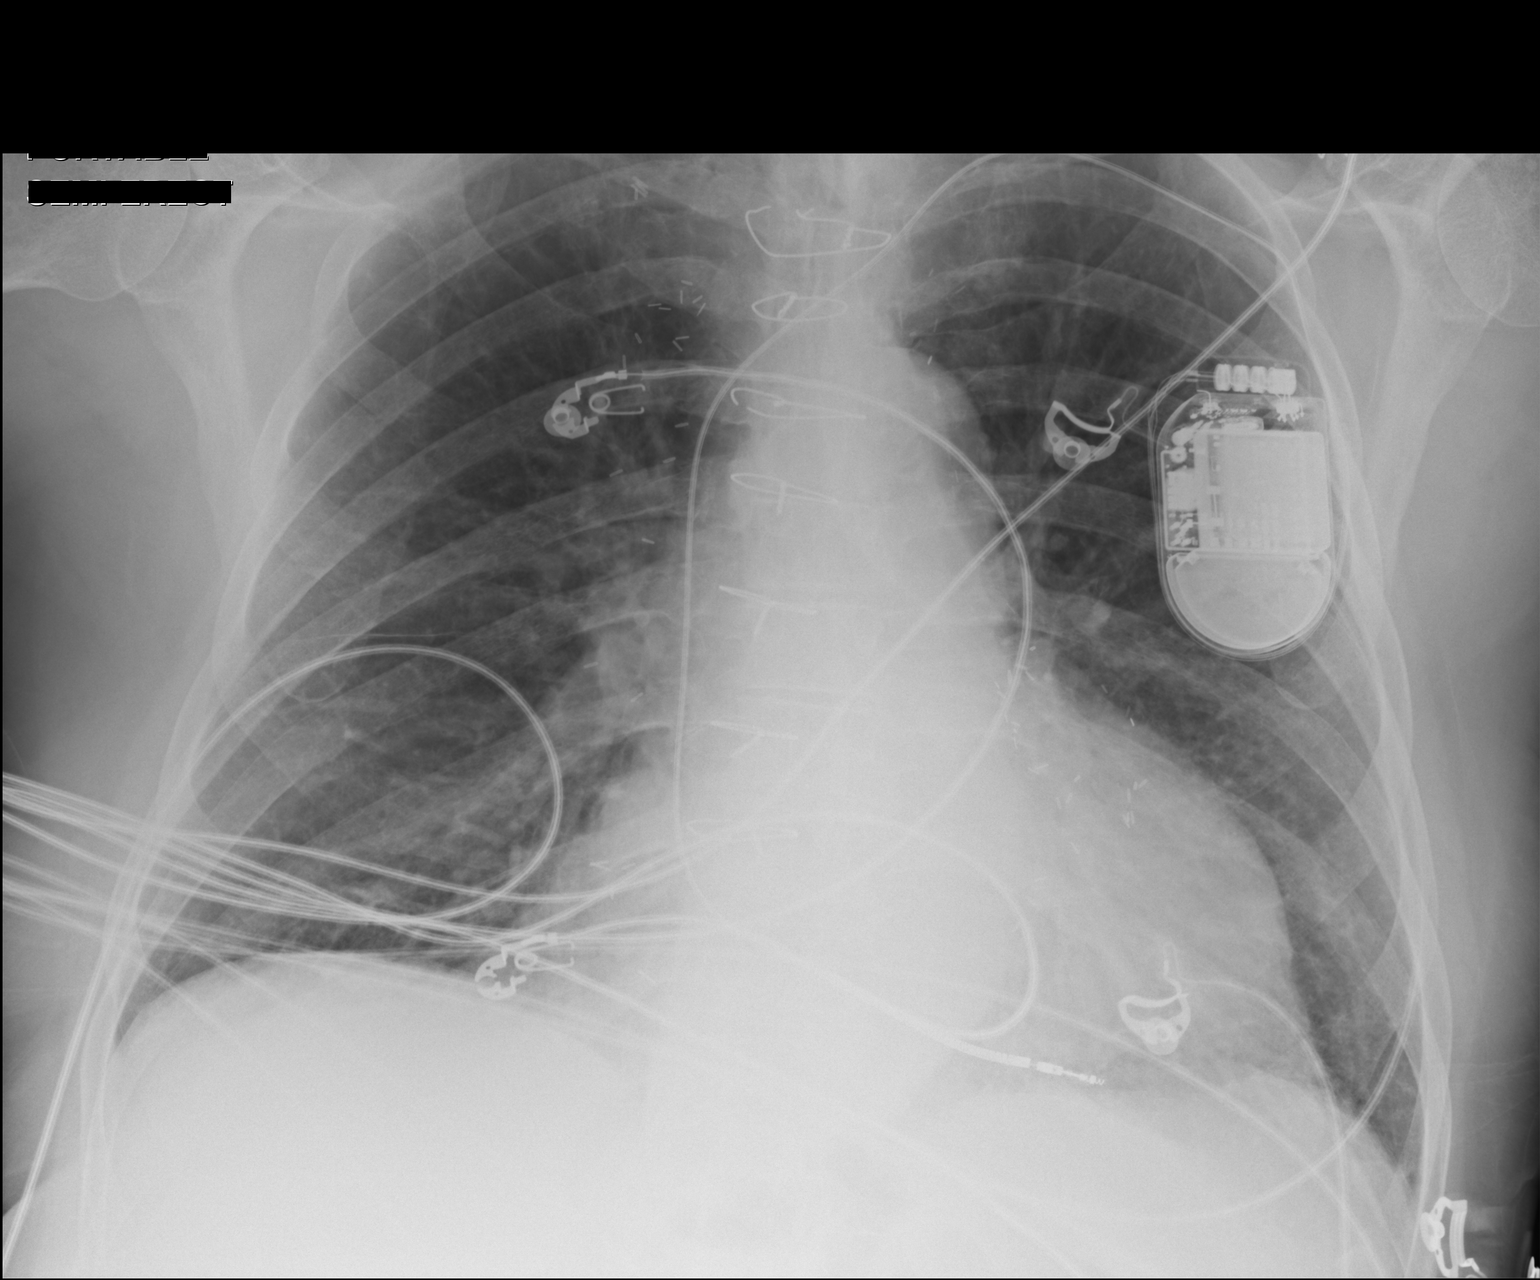

[1 of 1 positions shown; findings below may reference images not displayed]

FINDINGS: LEFT subclavian transvenous AICD lead tip projects over RIGHT
ventricle.

Enlargement of cardiac silhouette post median sternotomy.

Calcified tortuous thoracic aorta.

Lungs grossly clear.

No definite pleural effusion or pneumothorax.
IMPRESSION: Enlargement of cardiac silhouette post median sternotomy and AICD
placement.

No acute abnormalities.

## 2017-07-20 DIAGNOSIS — R399 Unspecified symptoms and signs involving the genitourinary system: Secondary | ICD-10-CM | POA: Diagnosis not present

## 2017-07-20 DIAGNOSIS — E1122 Type 2 diabetes mellitus with diabetic chronic kidney disease: Secondary | ICD-10-CM | POA: Diagnosis not present

## 2017-07-20 DIAGNOSIS — N390 Urinary tract infection, site not specified: Secondary | ICD-10-CM | POA: Diagnosis not present

## 2017-07-20 DIAGNOSIS — I5022 Chronic systolic (congestive) heart failure: Secondary | ICD-10-CM | POA: Diagnosis not present

## 2017-07-20 DIAGNOSIS — R531 Weakness: Secondary | ICD-10-CM | POA: Diagnosis not present

## 2017-07-20 DIAGNOSIS — R410 Disorientation, unspecified: Secondary | ICD-10-CM | POA: Diagnosis not present

## 2017-07-20 DIAGNOSIS — R509 Fever, unspecified: Secondary | ICD-10-CM | POA: Diagnosis not present

## 2017-07-21 DIAGNOSIS — N3000 Acute cystitis without hematuria: Secondary | ICD-10-CM | POA: Diagnosis not present

## 2017-07-21 DIAGNOSIS — Z951 Presence of aortocoronary bypass graft: Secondary | ICD-10-CM | POA: Diagnosis not present

## 2017-07-21 DIAGNOSIS — N184 Chronic kidney disease, stage 4 (severe): Secondary | ICD-10-CM | POA: Diagnosis not present

## 2017-07-21 DIAGNOSIS — N189 Chronic kidney disease, unspecified: Secondary | ICD-10-CM | POA: Diagnosis not present

## 2017-07-21 DIAGNOSIS — N271 Small kidney, bilateral: Secondary | ICD-10-CM | POA: Diagnosis not present

## 2017-07-21 DIAGNOSIS — N281 Cyst of kidney, acquired: Secondary | ICD-10-CM | POA: Diagnosis not present

## 2017-07-21 DIAGNOSIS — I252 Old myocardial infarction: Secondary | ICD-10-CM | POA: Diagnosis not present

## 2017-07-21 DIAGNOSIS — G9341 Metabolic encephalopathy: Secondary | ICD-10-CM | POA: Diagnosis not present

## 2017-07-21 DIAGNOSIS — I5022 Chronic systolic (congestive) heart failure: Secondary | ICD-10-CM | POA: Diagnosis not present

## 2017-07-21 DIAGNOSIS — I255 Ischemic cardiomyopathy: Secondary | ICD-10-CM | POA: Diagnosis present

## 2017-07-21 DIAGNOSIS — R410 Disorientation, unspecified: Secondary | ICD-10-CM | POA: Diagnosis not present

## 2017-07-21 DIAGNOSIS — R748 Abnormal levels of other serum enzymes: Secondary | ICD-10-CM | POA: Diagnosis present

## 2017-07-21 DIAGNOSIS — I340 Nonrheumatic mitral (valve) insufficiency: Secondary | ICD-10-CM | POA: Diagnosis present

## 2017-07-21 DIAGNOSIS — I480 Paroxysmal atrial fibrillation: Secondary | ICD-10-CM | POA: Diagnosis present

## 2017-07-21 DIAGNOSIS — Z95810 Presence of automatic (implantable) cardiac defibrillator: Secondary | ICD-10-CM | POA: Diagnosis not present

## 2017-07-21 DIAGNOSIS — N179 Acute kidney failure, unspecified: Secondary | ICD-10-CM | POA: Diagnosis not present

## 2017-07-21 DIAGNOSIS — E785 Hyperlipidemia, unspecified: Secondary | ICD-10-CM | POA: Diagnosis present

## 2017-07-21 DIAGNOSIS — N390 Urinary tract infection, site not specified: Secondary | ICD-10-CM | POA: Diagnosis not present

## 2017-07-21 DIAGNOSIS — N400 Enlarged prostate without lower urinary tract symptoms: Secondary | ICD-10-CM | POA: Diagnosis present

## 2017-07-21 DIAGNOSIS — F0390 Unspecified dementia without behavioral disturbance: Secondary | ICD-10-CM | POA: Diagnosis present

## 2017-07-21 DIAGNOSIS — D631 Anemia in chronic kidney disease: Secondary | ICD-10-CM | POA: Diagnosis not present

## 2017-07-21 DIAGNOSIS — I509 Heart failure, unspecified: Secondary | ICD-10-CM | POA: Diagnosis not present

## 2017-07-21 DIAGNOSIS — E1122 Type 2 diabetes mellitus with diabetic chronic kidney disease: Secondary | ICD-10-CM | POA: Diagnosis not present

## 2017-07-21 DIAGNOSIS — I440 Atrioventricular block, first degree: Secondary | ICD-10-CM | POA: Diagnosis present

## 2017-07-21 DIAGNOSIS — E039 Hypothyroidism, unspecified: Secondary | ICD-10-CM | POA: Diagnosis not present

## 2017-07-21 DIAGNOSIS — E11649 Type 2 diabetes mellitus with hypoglycemia without coma: Secondary | ICD-10-CM | POA: Diagnosis present

## 2017-07-21 DIAGNOSIS — F329 Major depressive disorder, single episode, unspecified: Secondary | ICD-10-CM | POA: Diagnosis present

## 2017-07-21 DIAGNOSIS — I4891 Unspecified atrial fibrillation: Secondary | ICD-10-CM | POA: Diagnosis not present

## 2017-07-21 DIAGNOSIS — Z794 Long term (current) use of insulin: Secondary | ICD-10-CM | POA: Diagnosis not present

## 2017-07-21 DIAGNOSIS — E1165 Type 2 diabetes mellitus with hyperglycemia: Secondary | ICD-10-CM | POA: Diagnosis not present

## 2017-07-21 DIAGNOSIS — N289 Disorder of kidney and ureter, unspecified: Secondary | ICD-10-CM | POA: Diagnosis not present

## 2017-07-21 DIAGNOSIS — R809 Proteinuria, unspecified: Secondary | ICD-10-CM | POA: Diagnosis not present

## 2017-07-21 DIAGNOSIS — K219 Gastro-esophageal reflux disease without esophagitis: Secondary | ICD-10-CM | POA: Diagnosis present

## 2017-07-21 DIAGNOSIS — I272 Pulmonary hypertension, unspecified: Secondary | ICD-10-CM | POA: Diagnosis present

## 2017-07-27 DIAGNOSIS — M2041 Other hammer toe(s) (acquired), right foot: Secondary | ICD-10-CM | POA: Diagnosis not present

## 2017-07-27 DIAGNOSIS — M2042 Other hammer toe(s) (acquired), left foot: Secondary | ICD-10-CM | POA: Diagnosis not present

## 2017-07-27 DIAGNOSIS — E1140 Type 2 diabetes mellitus with diabetic neuropathy, unspecified: Secondary | ICD-10-CM | POA: Diagnosis not present

## 2017-08-02 DIAGNOSIS — K219 Gastro-esophageal reflux disease without esophagitis: Secondary | ICD-10-CM | POA: Diagnosis not present

## 2017-08-02 DIAGNOSIS — N184 Chronic kidney disease, stage 4 (severe): Secondary | ICD-10-CM | POA: Diagnosis not present

## 2017-08-02 DIAGNOSIS — E039 Hypothyroidism, unspecified: Secondary | ICD-10-CM | POA: Diagnosis not present

## 2017-08-02 DIAGNOSIS — E119 Type 2 diabetes mellitus without complications: Secondary | ICD-10-CM | POA: Diagnosis not present

## 2017-08-02 DIAGNOSIS — Z87891 Personal history of nicotine dependence: Secondary | ICD-10-CM | POA: Diagnosis not present

## 2017-08-02 DIAGNOSIS — I10 Essential (primary) hypertension: Secondary | ICD-10-CM | POA: Diagnosis not present

## 2017-08-02 DIAGNOSIS — J449 Chronic obstructive pulmonary disease, unspecified: Secondary | ICD-10-CM | POA: Diagnosis not present

## 2017-08-02 DIAGNOSIS — E785 Hyperlipidemia, unspecified: Secondary | ICD-10-CM | POA: Diagnosis not present

## 2017-08-02 DIAGNOSIS — M1990 Unspecified osteoarthritis, unspecified site: Secondary | ICD-10-CM | POA: Diagnosis not present

## 2017-08-02 DIAGNOSIS — I2510 Atherosclerotic heart disease of native coronary artery without angina pectoris: Secondary | ICD-10-CM | POA: Diagnosis not present

## 2017-08-02 DIAGNOSIS — I517 Cardiomegaly: Secondary | ICD-10-CM | POA: Diagnosis not present

## 2017-08-02 DIAGNOSIS — R413 Other amnesia: Secondary | ICD-10-CM | POA: Diagnosis not present

## 2017-08-02 DIAGNOSIS — I4892 Unspecified atrial flutter: Secondary | ICD-10-CM | POA: Diagnosis not present

## 2017-08-10 DIAGNOSIS — M1990 Unspecified osteoarthritis, unspecified site: Secondary | ICD-10-CM | POA: Diagnosis not present

## 2017-08-13 DIAGNOSIS — R0600 Dyspnea, unspecified: Secondary | ICD-10-CM | POA: Diagnosis not present

## 2017-08-13 DIAGNOSIS — I10 Essential (primary) hypertension: Secondary | ICD-10-CM | POA: Diagnosis not present

## 2017-08-13 DIAGNOSIS — M1990 Unspecified osteoarthritis, unspecified site: Secondary | ICD-10-CM | POA: Diagnosis not present

## 2017-08-16 DIAGNOSIS — I255 Ischemic cardiomyopathy: Secondary | ICD-10-CM | POA: Diagnosis not present

## 2017-08-16 DIAGNOSIS — Z7901 Long term (current) use of anticoagulants: Secondary | ICD-10-CM | POA: Diagnosis not present

## 2017-08-16 DIAGNOSIS — Z95810 Presence of automatic (implantable) cardiac defibrillator: Secondary | ICD-10-CM | POA: Diagnosis not present

## 2017-08-16 DIAGNOSIS — I5022 Chronic systolic (congestive) heart failure: Secondary | ICD-10-CM | POA: Diagnosis not present

## 2017-08-16 DIAGNOSIS — I483 Typical atrial flutter: Secondary | ICD-10-CM | POA: Diagnosis not present

## 2017-08-17 DIAGNOSIS — M1990 Unspecified osteoarthritis, unspecified site: Secondary | ICD-10-CM | POA: Diagnosis not present

## 2017-08-20 DIAGNOSIS — M1990 Unspecified osteoarthritis, unspecified site: Secondary | ICD-10-CM | POA: Diagnosis not present

## 2017-08-31 DIAGNOSIS — S72111A Displaced fracture of greater trochanter of right femur, initial encounter for closed fracture: Secondary | ICD-10-CM | POA: Diagnosis not present

## 2017-08-31 DIAGNOSIS — D631 Anemia in chronic kidney disease: Secondary | ICD-10-CM | POA: Diagnosis not present

## 2017-08-31 DIAGNOSIS — S7221XA Displaced subtrochanteric fracture of right femur, initial encounter for closed fracture: Secondary | ICD-10-CM | POA: Diagnosis not present

## 2017-08-31 DIAGNOSIS — M25551 Pain in right hip: Secondary | ICD-10-CM | POA: Diagnosis not present

## 2017-08-31 DIAGNOSIS — S72331A Displaced oblique fracture of shaft of right femur, initial encounter for closed fracture: Secondary | ICD-10-CM | POA: Diagnosis not present

## 2017-08-31 DIAGNOSIS — M1990 Unspecified osteoarthritis, unspecified site: Secondary | ICD-10-CM | POA: Diagnosis present

## 2017-08-31 DIAGNOSIS — Z9181 History of falling: Secondary | ICD-10-CM | POA: Diagnosis not present

## 2017-08-31 DIAGNOSIS — W19XXXA Unspecified fall, initial encounter: Secondary | ICD-10-CM | POA: Diagnosis not present

## 2017-08-31 DIAGNOSIS — I5022 Chronic systolic (congestive) heart failure: Secondary | ICD-10-CM | POA: Diagnosis not present

## 2017-08-31 DIAGNOSIS — S0990XA Unspecified injury of head, initial encounter: Secondary | ICD-10-CM | POA: Diagnosis not present

## 2017-08-31 DIAGNOSIS — Z794 Long term (current) use of insulin: Secondary | ICD-10-CM | POA: Diagnosis not present

## 2017-08-31 DIAGNOSIS — N390 Urinary tract infection, site not specified: Secondary | ICD-10-CM | POA: Diagnosis not present

## 2017-08-31 DIAGNOSIS — E1165 Type 2 diabetes mellitus with hyperglycemia: Secondary | ICD-10-CM | POA: Diagnosis not present

## 2017-08-31 DIAGNOSIS — S8991XA Unspecified injury of right lower leg, initial encounter: Secondary | ICD-10-CM | POA: Diagnosis not present

## 2017-08-31 DIAGNOSIS — F329 Major depressive disorder, single episode, unspecified: Secondary | ICD-10-CM | POA: Diagnosis present

## 2017-08-31 DIAGNOSIS — E785 Hyperlipidemia, unspecified: Secondary | ICD-10-CM | POA: Diagnosis not present

## 2017-08-31 DIAGNOSIS — K219 Gastro-esophageal reflux disease without esophagitis: Secondary | ICD-10-CM | POA: Diagnosis not present

## 2017-08-31 DIAGNOSIS — Z951 Presence of aortocoronary bypass graft: Secondary | ICD-10-CM | POA: Diagnosis not present

## 2017-08-31 DIAGNOSIS — B952 Enterococcus as the cause of diseases classified elsewhere: Secondary | ICD-10-CM | POA: Diagnosis not present

## 2017-08-31 DIAGNOSIS — R531 Weakness: Secondary | ICD-10-CM | POA: Diagnosis not present

## 2017-08-31 DIAGNOSIS — F0390 Unspecified dementia without behavioral disturbance: Secondary | ICD-10-CM | POA: Diagnosis present

## 2017-08-31 DIAGNOSIS — R112 Nausea with vomiting, unspecified: Secondary | ICD-10-CM | POA: Diagnosis not present

## 2017-08-31 DIAGNOSIS — R262 Difficulty in walking, not elsewhere classified: Secondary | ICD-10-CM | POA: Diagnosis not present

## 2017-08-31 DIAGNOSIS — E1122 Type 2 diabetes mellitus with diabetic chronic kidney disease: Secondary | ICD-10-CM | POA: Diagnosis not present

## 2017-08-31 DIAGNOSIS — R140 Abdominal distension (gaseous): Secondary | ICD-10-CM | POA: Diagnosis not present

## 2017-08-31 DIAGNOSIS — R5383 Other fatigue: Secondary | ICD-10-CM | POA: Diagnosis not present

## 2017-08-31 DIAGNOSIS — T481X5A Adverse effect of skeletal muscle relaxants [neuromuscular blocking agents], initial encounter: Secondary | ICD-10-CM | POA: Diagnosis not present

## 2017-08-31 DIAGNOSIS — Z7901 Long term (current) use of anticoagulants: Secondary | ICD-10-CM | POA: Diagnosis not present

## 2017-08-31 DIAGNOSIS — S72001A Fracture of unspecified part of neck of right femur, initial encounter for closed fracture: Secondary | ICD-10-CM | POA: Diagnosis not present

## 2017-08-31 DIAGNOSIS — S7224XA Nondisplaced subtrochanteric fracture of right femur, initial encounter for closed fracture: Secondary | ICD-10-CM | POA: Diagnosis not present

## 2017-08-31 DIAGNOSIS — S72124A Nondisplaced fracture of lesser trochanter of right femur, initial encounter for closed fracture: Secondary | ICD-10-CM | POA: Diagnosis not present

## 2017-08-31 DIAGNOSIS — R809 Proteinuria, unspecified: Secondary | ICD-10-CM | POA: Diagnosis not present

## 2017-08-31 DIAGNOSIS — S6991XA Unspecified injury of right wrist, hand and finger(s), initial encounter: Secondary | ICD-10-CM | POA: Diagnosis not present

## 2017-08-31 DIAGNOSIS — M9701XD Periprosthetic fracture around internal prosthetic right hip joint, subsequent encounter: Secondary | ICD-10-CM | POA: Diagnosis not present

## 2017-08-31 DIAGNOSIS — M9701XA Periprosthetic fracture around internal prosthetic right hip joint, initial encounter: Secondary | ICD-10-CM | POA: Diagnosis not present

## 2017-08-31 DIAGNOSIS — S72144A Nondisplaced intertrochanteric fracture of right femur, initial encounter for closed fracture: Secondary | ICD-10-CM | POA: Diagnosis not present

## 2017-08-31 DIAGNOSIS — Z96643 Presence of artificial hip joint, bilateral: Secondary | ICD-10-CM | POA: Diagnosis present

## 2017-08-31 DIAGNOSIS — M25552 Pain in left hip: Secondary | ICD-10-CM | POA: Diagnosis not present

## 2017-08-31 DIAGNOSIS — S199XXA Unspecified injury of neck, initial encounter: Secondary | ICD-10-CM | POA: Diagnosis not present

## 2017-08-31 DIAGNOSIS — I4891 Unspecified atrial fibrillation: Secondary | ICD-10-CM | POA: Diagnosis present

## 2017-08-31 DIAGNOSIS — E039 Hypothyroidism, unspecified: Secondary | ICD-10-CM | POA: Diagnosis not present

## 2017-08-31 DIAGNOSIS — S72001D Fracture of unspecified part of neck of right femur, subsequent encounter for closed fracture with routine healing: Secondary | ICD-10-CM | POA: Diagnosis not present

## 2017-08-31 DIAGNOSIS — N184 Chronic kidney disease, stage 4 (severe): Secondary | ICD-10-CM | POA: Diagnosis not present

## 2017-08-31 DIAGNOSIS — S0011XA Contusion of right eyelid and periocular area, initial encounter: Secondary | ICD-10-CM | POA: Diagnosis present

## 2017-08-31 DIAGNOSIS — T402X5A Adverse effect of other opioids, initial encounter: Secondary | ICD-10-CM | POA: Diagnosis not present

## 2017-09-08 DIAGNOSIS — R809 Proteinuria, unspecified: Secondary | ICD-10-CM | POA: Diagnosis not present

## 2017-09-08 DIAGNOSIS — S72001A Fracture of unspecified part of neck of right femur, initial encounter for closed fracture: Secondary | ICD-10-CM | POA: Diagnosis not present

## 2017-09-08 DIAGNOSIS — E1165 Type 2 diabetes mellitus with hyperglycemia: Secondary | ICD-10-CM | POA: Diagnosis not present

## 2017-09-08 DIAGNOSIS — N390 Urinary tract infection, site not specified: Secondary | ICD-10-CM | POA: Diagnosis not present

## 2017-09-08 DIAGNOSIS — M9701XD Periprosthetic fracture around internal prosthetic right hip joint, subsequent encounter: Secondary | ICD-10-CM | POA: Diagnosis not present

## 2017-09-08 DIAGNOSIS — R531 Weakness: Secondary | ICD-10-CM | POA: Diagnosis not present

## 2017-09-08 DIAGNOSIS — I5022 Chronic systolic (congestive) heart failure: Secondary | ICD-10-CM | POA: Diagnosis not present

## 2017-09-08 DIAGNOSIS — F331 Major depressive disorder, recurrent, moderate: Secondary | ICD-10-CM | POA: Diagnosis not present

## 2017-09-08 DIAGNOSIS — S72001D Fracture of unspecified part of neck of right femur, subsequent encounter for closed fracture with routine healing: Secondary | ICD-10-CM | POA: Diagnosis not present

## 2017-09-08 DIAGNOSIS — E1122 Type 2 diabetes mellitus with diabetic chronic kidney disease: Secondary | ICD-10-CM | POA: Diagnosis not present

## 2017-09-08 DIAGNOSIS — E785 Hyperlipidemia, unspecified: Secondary | ICD-10-CM | POA: Diagnosis not present

## 2017-09-08 DIAGNOSIS — N184 Chronic kidney disease, stage 4 (severe): Secondary | ICD-10-CM | POA: Diagnosis not present

## 2017-09-08 DIAGNOSIS — B952 Enterococcus as the cause of diseases classified elsewhere: Secondary | ICD-10-CM | POA: Diagnosis not present

## 2017-09-08 DIAGNOSIS — F0390 Unspecified dementia without behavioral disturbance: Secondary | ICD-10-CM | POA: Diagnosis not present

## 2017-09-08 DIAGNOSIS — K219 Gastro-esophageal reflux disease without esophagitis: Secondary | ICD-10-CM | POA: Diagnosis not present

## 2017-09-08 DIAGNOSIS — R262 Difficulty in walking, not elsewhere classified: Secondary | ICD-10-CM | POA: Diagnosis not present

## 2017-09-08 DIAGNOSIS — D631 Anemia in chronic kidney disease: Secondary | ICD-10-CM | POA: Diagnosis not present

## 2017-09-08 DIAGNOSIS — E039 Hypothyroidism, unspecified: Secondary | ICD-10-CM | POA: Diagnosis not present

## 2017-10-05 DIAGNOSIS — R262 Difficulty in walking, not elsewhere classified: Secondary | ICD-10-CM | POA: Diagnosis not present

## 2017-10-05 DIAGNOSIS — N184 Chronic kidney disease, stage 4 (severe): Secondary | ICD-10-CM | POA: Diagnosis not present

## 2017-10-05 DIAGNOSIS — E1122 Type 2 diabetes mellitus with diabetic chronic kidney disease: Secondary | ICD-10-CM | POA: Diagnosis not present

## 2017-10-05 DIAGNOSIS — I5022 Chronic systolic (congestive) heart failure: Secondary | ICD-10-CM | POA: Diagnosis not present

## 2017-10-05 DIAGNOSIS — E1165 Type 2 diabetes mellitus with hyperglycemia: Secondary | ICD-10-CM | POA: Diagnosis not present

## 2017-10-05 DIAGNOSIS — R531 Weakness: Secondary | ICD-10-CM | POA: Diagnosis not present

## 2017-10-05 DIAGNOSIS — E785 Hyperlipidemia, unspecified: Secondary | ICD-10-CM | POA: Diagnosis not present

## 2017-10-05 DIAGNOSIS — S72001D Fracture of unspecified part of neck of right femur, subsequent encounter for closed fracture with routine healing: Secondary | ICD-10-CM | POA: Diagnosis not present

## 2017-10-05 DIAGNOSIS — B952 Enterococcus as the cause of diseases classified elsewhere: Secondary | ICD-10-CM | POA: Diagnosis not present

## 2017-10-05 DIAGNOSIS — M9701XD Periprosthetic fracture around internal prosthetic right hip joint, subsequent encounter: Secondary | ICD-10-CM | POA: Diagnosis not present

## 2017-10-05 DIAGNOSIS — K219 Gastro-esophageal reflux disease without esophagitis: Secondary | ICD-10-CM | POA: Diagnosis not present

## 2017-10-05 DIAGNOSIS — N390 Urinary tract infection, site not specified: Secondary | ICD-10-CM | POA: Diagnosis not present

## 2017-10-06 DIAGNOSIS — Z9181 History of falling: Secondary | ICD-10-CM | POA: Diagnosis not present

## 2017-10-06 DIAGNOSIS — Z95810 Presence of automatic (implantable) cardiac defibrillator: Secondary | ICD-10-CM | POA: Diagnosis not present

## 2017-10-06 DIAGNOSIS — Z7901 Long term (current) use of anticoagulants: Secondary | ICD-10-CM | POA: Diagnosis not present

## 2017-10-06 DIAGNOSIS — I483 Typical atrial flutter: Secondary | ICD-10-CM | POA: Diagnosis not present

## 2017-10-08 DIAGNOSIS — R319 Hematuria, unspecified: Secondary | ICD-10-CM | POA: Diagnosis not present

## 2017-10-08 DIAGNOSIS — N400 Enlarged prostate without lower urinary tract symptoms: Secondary | ICD-10-CM | POA: Diagnosis not present

## 2017-10-08 DIAGNOSIS — Z466 Encounter for fitting and adjustment of urinary device: Secondary | ICD-10-CM | POA: Diagnosis not present

## 2017-10-08 DIAGNOSIS — N390 Urinary tract infection, site not specified: Secondary | ICD-10-CM | POA: Diagnosis not present

## 2017-10-08 DIAGNOSIS — R339 Retention of urine, unspecified: Secondary | ICD-10-CM | POA: Diagnosis not present

## 2017-10-10 DIAGNOSIS — E1165 Type 2 diabetes mellitus with hyperglycemia: Secondary | ICD-10-CM | POA: Diagnosis not present

## 2017-10-10 DIAGNOSIS — I5022 Chronic systolic (congestive) heart failure: Secondary | ICD-10-CM | POA: Diagnosis not present

## 2017-10-10 DIAGNOSIS — S72001D Fracture of unspecified part of neck of right femur, subsequent encounter for closed fracture with routine healing: Secondary | ICD-10-CM | POA: Diagnosis not present

## 2017-10-10 DIAGNOSIS — E1122 Type 2 diabetes mellitus with diabetic chronic kidney disease: Secondary | ICD-10-CM | POA: Diagnosis not present

## 2017-10-10 DIAGNOSIS — M9701XD Periprosthetic fracture around internal prosthetic right hip joint, subsequent encounter: Secondary | ICD-10-CM | POA: Diagnosis not present

## 2017-10-10 DIAGNOSIS — F0390 Unspecified dementia without behavioral disturbance: Secondary | ICD-10-CM | POA: Diagnosis not present

## 2017-10-12 DIAGNOSIS — I10 Essential (primary) hypertension: Secondary | ICD-10-CM | POA: Diagnosis not present

## 2017-10-12 DIAGNOSIS — F0390 Unspecified dementia without behavioral disturbance: Secondary | ICD-10-CM | POA: Diagnosis not present

## 2017-10-12 DIAGNOSIS — E119 Type 2 diabetes mellitus without complications: Secondary | ICD-10-CM | POA: Diagnosis not present

## 2017-10-12 DIAGNOSIS — D729 Disorder of white blood cells, unspecified: Secondary | ICD-10-CM | POA: Diagnosis not present

## 2017-10-12 DIAGNOSIS — I5022 Chronic systolic (congestive) heart failure: Secondary | ICD-10-CM | POA: Diagnosis not present

## 2017-10-12 DIAGNOSIS — S72001D Fracture of unspecified part of neck of right femur, subsequent encounter for closed fracture with routine healing: Secondary | ICD-10-CM | POA: Diagnosis not present

## 2017-10-12 DIAGNOSIS — E1165 Type 2 diabetes mellitus with hyperglycemia: Secondary | ICD-10-CM | POA: Diagnosis not present

## 2017-10-12 DIAGNOSIS — R251 Tremor, unspecified: Secondary | ICD-10-CM | POA: Diagnosis not present

## 2017-10-12 DIAGNOSIS — I4892 Unspecified atrial flutter: Secondary | ICD-10-CM | POA: Diagnosis not present

## 2017-10-12 DIAGNOSIS — Z87891 Personal history of nicotine dependence: Secondary | ICD-10-CM | POA: Diagnosis not present

## 2017-10-12 DIAGNOSIS — N184 Chronic kidney disease, stage 4 (severe): Secondary | ICD-10-CM | POA: Diagnosis not present

## 2017-10-12 DIAGNOSIS — E1122 Type 2 diabetes mellitus with diabetic chronic kidney disease: Secondary | ICD-10-CM | POA: Diagnosis not present

## 2017-10-12 DIAGNOSIS — M9701XD Periprosthetic fracture around internal prosthetic right hip joint, subsequent encounter: Secondary | ICD-10-CM | POA: Diagnosis not present

## 2017-10-13 DIAGNOSIS — E1165 Type 2 diabetes mellitus with hyperglycemia: Secondary | ICD-10-CM | POA: Diagnosis not present

## 2017-10-13 DIAGNOSIS — M9701XD Periprosthetic fracture around internal prosthetic right hip joint, subsequent encounter: Secondary | ICD-10-CM | POA: Diagnosis not present

## 2017-10-13 DIAGNOSIS — E1122 Type 2 diabetes mellitus with diabetic chronic kidney disease: Secondary | ICD-10-CM | POA: Diagnosis not present

## 2017-10-13 DIAGNOSIS — I5022 Chronic systolic (congestive) heart failure: Secondary | ICD-10-CM | POA: Diagnosis not present

## 2017-10-13 DIAGNOSIS — F0390 Unspecified dementia without behavioral disturbance: Secondary | ICD-10-CM | POA: Diagnosis not present

## 2017-10-13 DIAGNOSIS — S72001D Fracture of unspecified part of neck of right femur, subsequent encounter for closed fracture with routine healing: Secondary | ICD-10-CM | POA: Diagnosis not present

## 2017-10-14 DIAGNOSIS — F0390 Unspecified dementia without behavioral disturbance: Secondary | ICD-10-CM | POA: Diagnosis not present

## 2017-10-14 DIAGNOSIS — E1122 Type 2 diabetes mellitus with diabetic chronic kidney disease: Secondary | ICD-10-CM | POA: Diagnosis not present

## 2017-10-14 DIAGNOSIS — M9701XD Periprosthetic fracture around internal prosthetic right hip joint, subsequent encounter: Secondary | ICD-10-CM | POA: Diagnosis not present

## 2017-10-14 DIAGNOSIS — I5022 Chronic systolic (congestive) heart failure: Secondary | ICD-10-CM | POA: Diagnosis not present

## 2017-10-14 DIAGNOSIS — E1165 Type 2 diabetes mellitus with hyperglycemia: Secondary | ICD-10-CM | POA: Diagnosis not present

## 2017-10-14 DIAGNOSIS — S72001D Fracture of unspecified part of neck of right femur, subsequent encounter for closed fracture with routine healing: Secondary | ICD-10-CM | POA: Diagnosis not present

## 2017-10-15 DIAGNOSIS — I5022 Chronic systolic (congestive) heart failure: Secondary | ICD-10-CM | POA: Diagnosis not present

## 2017-10-15 DIAGNOSIS — E1165 Type 2 diabetes mellitus with hyperglycemia: Secondary | ICD-10-CM | POA: Diagnosis not present

## 2017-10-15 DIAGNOSIS — E1122 Type 2 diabetes mellitus with diabetic chronic kidney disease: Secondary | ICD-10-CM | POA: Diagnosis not present

## 2017-10-15 DIAGNOSIS — M9701XD Periprosthetic fracture around internal prosthetic right hip joint, subsequent encounter: Secondary | ICD-10-CM | POA: Diagnosis not present

## 2017-10-15 DIAGNOSIS — F0390 Unspecified dementia without behavioral disturbance: Secondary | ICD-10-CM | POA: Diagnosis not present

## 2017-10-15 DIAGNOSIS — S72001D Fracture of unspecified part of neck of right femur, subsequent encounter for closed fracture with routine healing: Secondary | ICD-10-CM | POA: Diagnosis not present

## 2017-10-17 DIAGNOSIS — R0602 Shortness of breath: Secondary | ICD-10-CM | POA: Diagnosis not present

## 2017-10-17 DIAGNOSIS — Z95810 Presence of automatic (implantable) cardiac defibrillator: Secondary | ICD-10-CM | POA: Diagnosis not present

## 2017-10-17 DIAGNOSIS — Z7984 Long term (current) use of oral hypoglycemic drugs: Secondary | ICD-10-CM | POA: Diagnosis not present

## 2017-10-17 DIAGNOSIS — R072 Precordial pain: Secondary | ICD-10-CM | POA: Diagnosis not present

## 2017-10-17 DIAGNOSIS — I5022 Chronic systolic (congestive) heart failure: Secondary | ICD-10-CM | POA: Diagnosis not present

## 2017-10-17 DIAGNOSIS — N179 Acute kidney failure, unspecified: Secondary | ICD-10-CM | POA: Diagnosis not present

## 2017-10-17 DIAGNOSIS — I472 Ventricular tachycardia: Secondary | ICD-10-CM | POA: Diagnosis not present

## 2017-10-17 DIAGNOSIS — I499 Cardiac arrhythmia, unspecified: Secondary | ICD-10-CM | POA: Diagnosis not present

## 2017-10-17 DIAGNOSIS — A0472 Enterocolitis due to Clostridium difficile, not specified as recurrent: Secondary | ICD-10-CM | POA: Diagnosis not present

## 2017-10-17 DIAGNOSIS — I501 Left ventricular failure: Secondary | ICD-10-CM | POA: Diagnosis not present

## 2017-10-17 DIAGNOSIS — R0989 Other specified symptoms and signs involving the circulatory and respiratory systems: Secondary | ICD-10-CM | POA: Diagnosis not present

## 2017-10-17 DIAGNOSIS — R339 Retention of urine, unspecified: Secondary | ICD-10-CM | POA: Diagnosis not present

## 2017-10-17 DIAGNOSIS — I440 Atrioventricular block, first degree: Secondary | ICD-10-CM | POA: Diagnosis not present

## 2017-10-17 DIAGNOSIS — M9701XD Periprosthetic fracture around internal prosthetic right hip joint, subsequent encounter: Secondary | ICD-10-CM | POA: Diagnosis not present

## 2017-10-17 DIAGNOSIS — R4182 Altered mental status, unspecified: Secondary | ICD-10-CM | POA: Diagnosis not present

## 2017-10-17 DIAGNOSIS — Z7901 Long term (current) use of anticoagulants: Secondary | ICD-10-CM | POA: Diagnosis not present

## 2017-10-17 DIAGNOSIS — F0390 Unspecified dementia without behavioral disturbance: Secondary | ICD-10-CM | POA: Diagnosis not present

## 2017-10-17 DIAGNOSIS — I5021 Acute systolic (congestive) heart failure: Secondary | ICD-10-CM | POA: Diagnosis not present

## 2017-10-17 DIAGNOSIS — N189 Chronic kidney disease, unspecified: Secondary | ICD-10-CM | POA: Diagnosis not present

## 2017-10-17 DIAGNOSIS — R9431 Abnormal electrocardiogram [ECG] [EKG]: Secondary | ICD-10-CM | POA: Diagnosis not present

## 2017-10-17 DIAGNOSIS — I445 Left posterior fascicular block: Secondary | ICD-10-CM | POA: Diagnosis not present

## 2017-10-17 DIAGNOSIS — Z79899 Other long term (current) drug therapy: Secondary | ICD-10-CM | POA: Diagnosis not present

## 2017-10-17 DIAGNOSIS — S72001D Fracture of unspecified part of neck of right femur, subsequent encounter for closed fracture with routine healing: Secondary | ICD-10-CM | POA: Diagnosis not present

## 2017-10-17 DIAGNOSIS — E1122 Type 2 diabetes mellitus with diabetic chronic kidney disease: Secondary | ICD-10-CM | POA: Diagnosis not present

## 2017-10-17 DIAGNOSIS — Z87891 Personal history of nicotine dependence: Secondary | ICD-10-CM | POA: Diagnosis not present

## 2017-10-17 DIAGNOSIS — R1084 Generalized abdominal pain: Secondary | ICD-10-CM | POA: Diagnosis not present

## 2017-10-17 DIAGNOSIS — E1165 Type 2 diabetes mellitus with hyperglycemia: Secondary | ICD-10-CM | POA: Diagnosis not present

## 2017-10-17 DIAGNOSIS — E039 Hypothyroidism, unspecified: Secondary | ICD-10-CM | POA: Diagnosis not present

## 2017-10-17 DIAGNOSIS — I255 Ischemic cardiomyopathy: Secondary | ICD-10-CM | POA: Diagnosis not present

## 2017-10-18 DIAGNOSIS — R0600 Dyspnea, unspecified: Secondary | ICD-10-CM | POA: Diagnosis not present

## 2017-10-18 DIAGNOSIS — I440 Atrioventricular block, first degree: Secondary | ICD-10-CM | POA: Diagnosis not present

## 2017-10-18 DIAGNOSIS — I445 Left posterior fascicular block: Secondary | ICD-10-CM | POA: Diagnosis not present

## 2017-10-18 DIAGNOSIS — R9431 Abnormal electrocardiogram [ECG] [EKG]: Secondary | ICD-10-CM | POA: Diagnosis not present

## 2017-10-18 DIAGNOSIS — I4892 Unspecified atrial flutter: Secondary | ICD-10-CM | POA: Diagnosis not present

## 2017-10-19 DIAGNOSIS — I4892 Unspecified atrial flutter: Secondary | ICD-10-CM | POA: Diagnosis not present

## 2017-10-19 DIAGNOSIS — R0600 Dyspnea, unspecified: Secondary | ICD-10-CM | POA: Diagnosis not present

## 2017-10-20 DIAGNOSIS — R0600 Dyspnea, unspecified: Secondary | ICD-10-CM | POA: Diagnosis not present

## 2017-10-20 DIAGNOSIS — I4892 Unspecified atrial flutter: Secondary | ICD-10-CM | POA: Diagnosis not present

## 2017-10-21 DIAGNOSIS — I4892 Unspecified atrial flutter: Secondary | ICD-10-CM | POA: Diagnosis not present

## 2017-10-21 DIAGNOSIS — R0600 Dyspnea, unspecified: Secondary | ICD-10-CM | POA: Diagnosis not present

## 2017-10-25 DIAGNOSIS — Z95810 Presence of automatic (implantable) cardiac defibrillator: Secondary | ICD-10-CM | POA: Diagnosis not present

## 2017-10-25 DIAGNOSIS — I5022 Chronic systolic (congestive) heart failure: Secondary | ICD-10-CM | POA: Diagnosis not present

## 2017-10-25 DIAGNOSIS — I255 Ischemic cardiomyopathy: Secondary | ICD-10-CM | POA: Diagnosis not present

## 2017-10-25 DIAGNOSIS — I483 Typical atrial flutter: Secondary | ICD-10-CM | POA: Diagnosis not present

## 2017-10-25 DIAGNOSIS — I2510 Atherosclerotic heart disease of native coronary artery without angina pectoris: Secondary | ICD-10-CM | POA: Diagnosis not present

## 2017-10-25 DIAGNOSIS — Z7901 Long term (current) use of anticoagulants: Secondary | ICD-10-CM | POA: Diagnosis not present

## 2017-10-26 DIAGNOSIS — G20 Parkinson's disease: Secondary | ICD-10-CM | POA: Diagnosis not present

## 2017-10-26 DIAGNOSIS — G250 Essential tremor: Secondary | ICD-10-CM | POA: Diagnosis not present

## 2017-10-29 DIAGNOSIS — I5022 Chronic systolic (congestive) heart failure: Secondary | ICD-10-CM | POA: Diagnosis not present

## 2017-11-01 DIAGNOSIS — I483 Typical atrial flutter: Secondary | ICD-10-CM | POA: Diagnosis not present

## 2017-11-01 DIAGNOSIS — I5022 Chronic systolic (congestive) heart failure: Secondary | ICD-10-CM | POA: Diagnosis not present

## 2017-11-01 DIAGNOSIS — Z7901 Long term (current) use of anticoagulants: Secondary | ICD-10-CM | POA: Diagnosis not present

## 2017-11-01 DIAGNOSIS — I255 Ischemic cardiomyopathy: Secondary | ICD-10-CM | POA: Diagnosis not present

## 2017-11-01 DIAGNOSIS — Z95810 Presence of automatic (implantable) cardiac defibrillator: Secondary | ICD-10-CM | POA: Diagnosis not present

## 2017-11-02 DIAGNOSIS — N184 Chronic kidney disease, stage 4 (severe): Secondary | ICD-10-CM | POA: Diagnosis not present

## 2017-11-02 DIAGNOSIS — I4892 Unspecified atrial flutter: Secondary | ICD-10-CM | POA: Diagnosis not present

## 2017-11-02 DIAGNOSIS — R413 Other amnesia: Secondary | ICD-10-CM | POA: Diagnosis not present

## 2017-11-02 DIAGNOSIS — N476 Balanoposthitis: Secondary | ICD-10-CM | POA: Diagnosis not present

## 2017-11-02 DIAGNOSIS — E039 Hypothyroidism, unspecified: Secondary | ICD-10-CM | POA: Diagnosis not present

## 2017-11-02 DIAGNOSIS — E119 Type 2 diabetes mellitus without complications: Secondary | ICD-10-CM | POA: Diagnosis not present

## 2017-11-02 DIAGNOSIS — I10 Essential (primary) hypertension: Secondary | ICD-10-CM | POA: Diagnosis not present

## 2017-11-02 DIAGNOSIS — N400 Enlarged prostate without lower urinary tract symptoms: Secondary | ICD-10-CM | POA: Diagnosis not present

## 2017-11-02 DIAGNOSIS — K219 Gastro-esophageal reflux disease without esophagitis: Secondary | ICD-10-CM | POA: Diagnosis not present

## 2017-11-02 DIAGNOSIS — G20 Parkinson's disease: Secondary | ICD-10-CM | POA: Diagnosis not present

## 2017-11-02 DIAGNOSIS — R339 Retention of urine, unspecified: Secondary | ICD-10-CM | POA: Diagnosis not present

## 2017-11-02 DIAGNOSIS — Z466 Encounter for fitting and adjustment of urinary device: Secondary | ICD-10-CM | POA: Diagnosis not present

## 2017-11-02 IMAGING — CR DG CHEST 2V
2 series · 2 of 2 positions shown · non-contrast
Comparison: Chest x-rays dated 12/10/2015 and 08/08/2014.

CLINICAL DATA: Shortness of breath for a couple of weeks, cough and
congestion.

EXAM:
CHEST  2 VIEW

[chest pa]
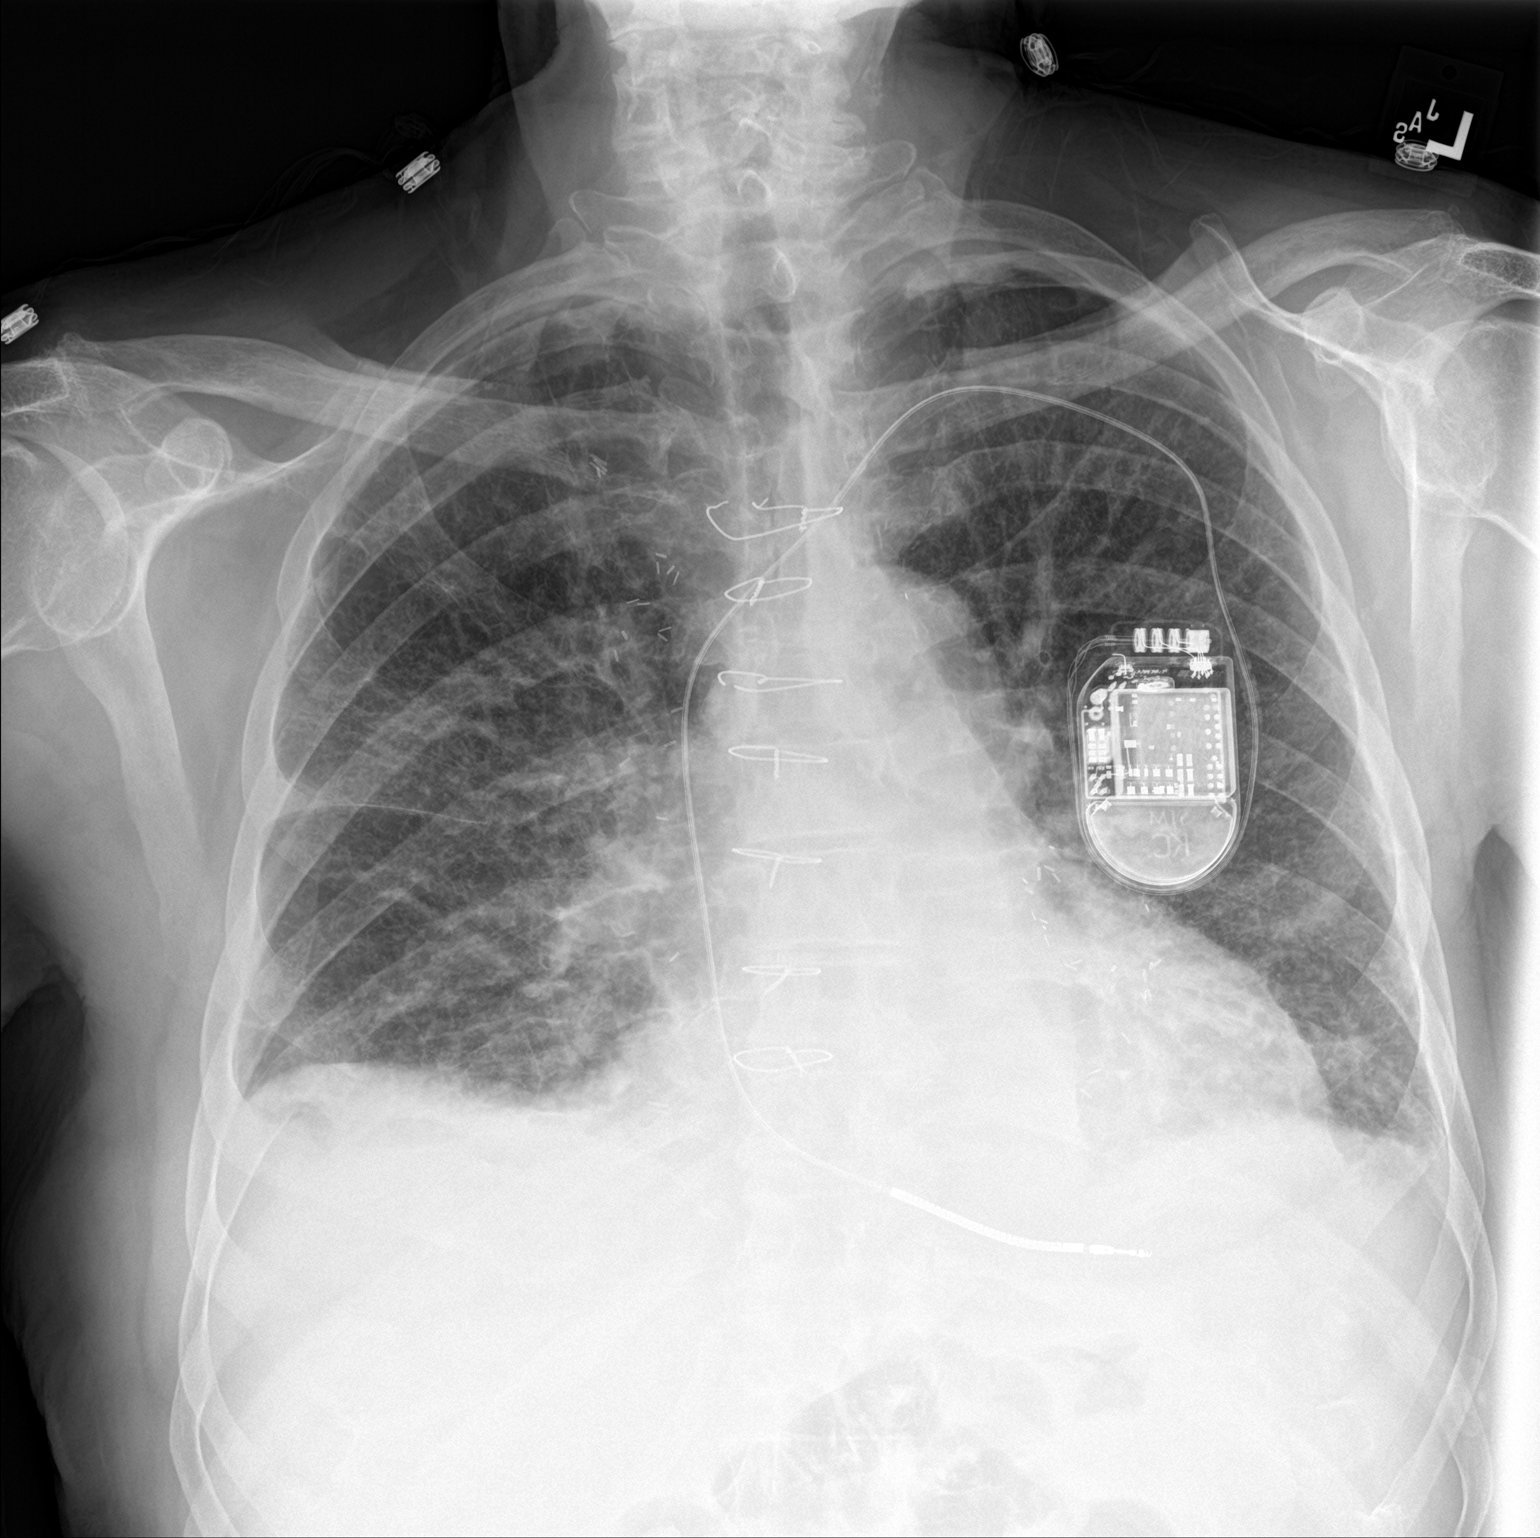

[chest lat]
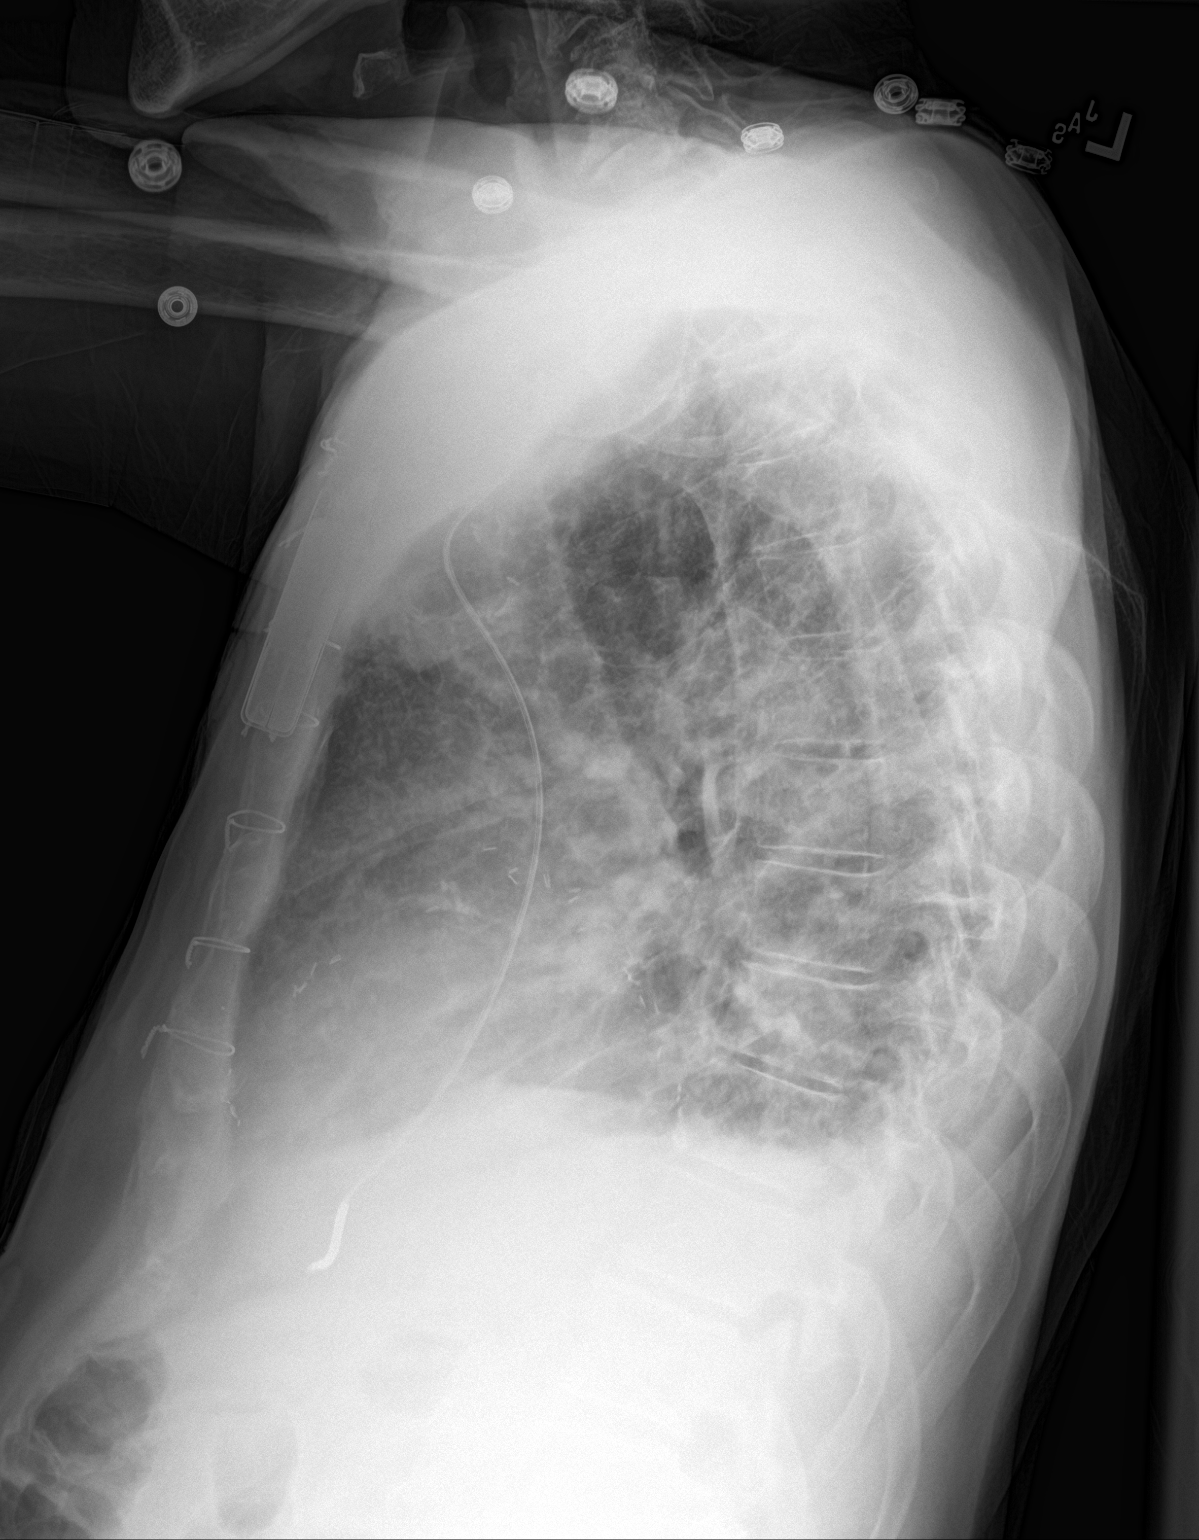

[2 of 2 positions shown; findings below may reference images not displayed]

FINDINGS: Mild cardiomegaly is stable. Overall cardiomediastinal silhouette
appears stable. Left chest wall pacemaker/ICD in place. Median
sternotomy wires stable in position.

There is central pulmonary vascular congestion mild interstitial
edema. Suspect small bilateral pleural effusions. No confluent
airspace opacity to suggest a developing pneumonia. Osseous
structures about the chest are unremarkable.
IMPRESSION: 1. Cardiomegaly with central pulmonary vascular congestion and mild
bilateral interstitial edema suggesting CHF/volume overload.
2. Probable small pleural effusions.
3. No evidence of pneumonia seen.

## 2017-11-04 DIAGNOSIS — F0390 Unspecified dementia without behavioral disturbance: Secondary | ICD-10-CM | POA: Diagnosis not present

## 2017-11-04 DIAGNOSIS — M9701XD Periprosthetic fracture around internal prosthetic right hip joint, subsequent encounter: Secondary | ICD-10-CM | POA: Diagnosis not present

## 2017-11-04 DIAGNOSIS — I5022 Chronic systolic (congestive) heart failure: Secondary | ICD-10-CM | POA: Diagnosis not present

## 2017-11-04 DIAGNOSIS — S72001D Fracture of unspecified part of neck of right femur, subsequent encounter for closed fracture with routine healing: Secondary | ICD-10-CM | POA: Diagnosis not present

## 2017-11-04 DIAGNOSIS — E1122 Type 2 diabetes mellitus with diabetic chronic kidney disease: Secondary | ICD-10-CM | POA: Diagnosis not present

## 2017-11-04 DIAGNOSIS — E1165 Type 2 diabetes mellitus with hyperglycemia: Secondary | ICD-10-CM | POA: Diagnosis not present

## 2017-11-08 DIAGNOSIS — E1122 Type 2 diabetes mellitus with diabetic chronic kidney disease: Secondary | ICD-10-CM | POA: Diagnosis not present

## 2017-11-08 DIAGNOSIS — S72001D Fracture of unspecified part of neck of right femur, subsequent encounter for closed fracture with routine healing: Secondary | ICD-10-CM | POA: Diagnosis not present

## 2017-11-08 DIAGNOSIS — F0390 Unspecified dementia without behavioral disturbance: Secondary | ICD-10-CM | POA: Diagnosis not present

## 2017-11-08 DIAGNOSIS — M9701XD Periprosthetic fracture around internal prosthetic right hip joint, subsequent encounter: Secondary | ICD-10-CM | POA: Diagnosis not present

## 2017-11-08 DIAGNOSIS — E1165 Type 2 diabetes mellitus with hyperglycemia: Secondary | ICD-10-CM | POA: Diagnosis not present

## 2017-11-08 DIAGNOSIS — I5022 Chronic systolic (congestive) heart failure: Secondary | ICD-10-CM | POA: Diagnosis not present

## 2017-11-09 DIAGNOSIS — Z95810 Presence of automatic (implantable) cardiac defibrillator: Secondary | ICD-10-CM | POA: Diagnosis not present

## 2017-11-09 DIAGNOSIS — M9701XD Periprosthetic fracture around internal prosthetic right hip joint, subsequent encounter: Secondary | ICD-10-CM | POA: Diagnosis not present

## 2017-11-09 DIAGNOSIS — I5022 Chronic systolic (congestive) heart failure: Secondary | ICD-10-CM | POA: Diagnosis not present

## 2017-11-09 DIAGNOSIS — Z7901 Long term (current) use of anticoagulants: Secondary | ICD-10-CM | POA: Diagnosis not present

## 2017-11-09 DIAGNOSIS — E1122 Type 2 diabetes mellitus with diabetic chronic kidney disease: Secondary | ICD-10-CM | POA: Diagnosis not present

## 2017-11-09 DIAGNOSIS — I483 Typical atrial flutter: Secondary | ICD-10-CM | POA: Diagnosis not present

## 2017-11-09 DIAGNOSIS — S72001D Fracture of unspecified part of neck of right femur, subsequent encounter for closed fracture with routine healing: Secondary | ICD-10-CM | POA: Diagnosis not present

## 2017-11-09 DIAGNOSIS — F0390 Unspecified dementia without behavioral disturbance: Secondary | ICD-10-CM | POA: Diagnosis not present

## 2017-11-09 DIAGNOSIS — E1165 Type 2 diabetes mellitus with hyperglycemia: Secondary | ICD-10-CM | POA: Diagnosis not present

## 2017-11-09 DIAGNOSIS — I255 Ischemic cardiomyopathy: Secondary | ICD-10-CM | POA: Diagnosis not present

## 2017-11-10 DIAGNOSIS — I5022 Chronic systolic (congestive) heart failure: Secondary | ICD-10-CM | POA: Diagnosis not present

## 2017-11-10 DIAGNOSIS — M9701XD Periprosthetic fracture around internal prosthetic right hip joint, subsequent encounter: Secondary | ICD-10-CM | POA: Diagnosis not present

## 2017-11-10 DIAGNOSIS — F0390 Unspecified dementia without behavioral disturbance: Secondary | ICD-10-CM | POA: Diagnosis not present

## 2017-11-10 DIAGNOSIS — E1122 Type 2 diabetes mellitus with diabetic chronic kidney disease: Secondary | ICD-10-CM | POA: Diagnosis not present

## 2017-11-10 DIAGNOSIS — E1165 Type 2 diabetes mellitus with hyperglycemia: Secondary | ICD-10-CM | POA: Diagnosis not present

## 2017-11-10 DIAGNOSIS — S72001D Fracture of unspecified part of neck of right femur, subsequent encounter for closed fracture with routine healing: Secondary | ICD-10-CM | POA: Diagnosis not present

## 2017-11-11 DIAGNOSIS — E1122 Type 2 diabetes mellitus with diabetic chronic kidney disease: Secondary | ICD-10-CM | POA: Diagnosis not present

## 2017-11-11 DIAGNOSIS — I5022 Chronic systolic (congestive) heart failure: Secondary | ICD-10-CM | POA: Diagnosis not present

## 2017-11-11 DIAGNOSIS — M9701XD Periprosthetic fracture around internal prosthetic right hip joint, subsequent encounter: Secondary | ICD-10-CM | POA: Diagnosis not present

## 2017-11-11 DIAGNOSIS — E1165 Type 2 diabetes mellitus with hyperglycemia: Secondary | ICD-10-CM | POA: Diagnosis not present

## 2017-11-11 DIAGNOSIS — S72001D Fracture of unspecified part of neck of right femur, subsequent encounter for closed fracture with routine healing: Secondary | ICD-10-CM | POA: Diagnosis not present

## 2017-11-11 DIAGNOSIS — F0390 Unspecified dementia without behavioral disturbance: Secondary | ICD-10-CM | POA: Diagnosis not present

## 2017-11-15 DIAGNOSIS — E1165 Type 2 diabetes mellitus with hyperglycemia: Secondary | ICD-10-CM | POA: Diagnosis not present

## 2017-11-15 DIAGNOSIS — E1122 Type 2 diabetes mellitus with diabetic chronic kidney disease: Secondary | ICD-10-CM | POA: Diagnosis not present

## 2017-11-15 DIAGNOSIS — S72001D Fracture of unspecified part of neck of right femur, subsequent encounter for closed fracture with routine healing: Secondary | ICD-10-CM | POA: Diagnosis not present

## 2017-11-15 DIAGNOSIS — R399 Unspecified symptoms and signs involving the genitourinary system: Secondary | ICD-10-CM | POA: Diagnosis not present

## 2017-11-15 DIAGNOSIS — M9701XD Periprosthetic fracture around internal prosthetic right hip joint, subsequent encounter: Secondary | ICD-10-CM | POA: Diagnosis not present

## 2017-11-15 DIAGNOSIS — F0390 Unspecified dementia without behavioral disturbance: Secondary | ICD-10-CM | POA: Diagnosis not present

## 2017-11-15 DIAGNOSIS — I5022 Chronic systolic (congestive) heart failure: Secondary | ICD-10-CM | POA: Diagnosis not present

## 2017-11-16 DIAGNOSIS — S72001D Fracture of unspecified part of neck of right femur, subsequent encounter for closed fracture with routine healing: Secondary | ICD-10-CM | POA: Diagnosis not present

## 2017-11-16 DIAGNOSIS — E1165 Type 2 diabetes mellitus with hyperglycemia: Secondary | ICD-10-CM | POA: Diagnosis not present

## 2017-11-16 DIAGNOSIS — E1122 Type 2 diabetes mellitus with diabetic chronic kidney disease: Secondary | ICD-10-CM | POA: Diagnosis not present

## 2017-11-16 DIAGNOSIS — M9701XD Periprosthetic fracture around internal prosthetic right hip joint, subsequent encounter: Secondary | ICD-10-CM | POA: Diagnosis not present

## 2017-11-16 DIAGNOSIS — F0390 Unspecified dementia without behavioral disturbance: Secondary | ICD-10-CM | POA: Diagnosis not present

## 2017-11-16 DIAGNOSIS — I5022 Chronic systolic (congestive) heart failure: Secondary | ICD-10-CM | POA: Diagnosis not present

## 2017-11-17 DIAGNOSIS — E1122 Type 2 diabetes mellitus with diabetic chronic kidney disease: Secondary | ICD-10-CM | POA: Diagnosis not present

## 2017-11-17 DIAGNOSIS — M9701XD Periprosthetic fracture around internal prosthetic right hip joint, subsequent encounter: Secondary | ICD-10-CM | POA: Diagnosis not present

## 2017-11-17 DIAGNOSIS — S72001D Fracture of unspecified part of neck of right femur, subsequent encounter for closed fracture with routine healing: Secondary | ICD-10-CM | POA: Diagnosis not present

## 2017-11-17 DIAGNOSIS — E1165 Type 2 diabetes mellitus with hyperglycemia: Secondary | ICD-10-CM | POA: Diagnosis not present

## 2017-11-17 DIAGNOSIS — I5022 Chronic systolic (congestive) heart failure: Secondary | ICD-10-CM | POA: Diagnosis not present

## 2017-11-17 DIAGNOSIS — F0390 Unspecified dementia without behavioral disturbance: Secondary | ICD-10-CM | POA: Diagnosis not present

## 2017-11-18 DIAGNOSIS — E1122 Type 2 diabetes mellitus with diabetic chronic kidney disease: Secondary | ICD-10-CM | POA: Diagnosis not present

## 2017-11-18 DIAGNOSIS — S72001D Fracture of unspecified part of neck of right femur, subsequent encounter for closed fracture with routine healing: Secondary | ICD-10-CM | POA: Diagnosis not present

## 2017-11-18 DIAGNOSIS — F0390 Unspecified dementia without behavioral disturbance: Secondary | ICD-10-CM | POA: Diagnosis not present

## 2017-11-18 DIAGNOSIS — E1165 Type 2 diabetes mellitus with hyperglycemia: Secondary | ICD-10-CM | POA: Diagnosis not present

## 2017-11-18 DIAGNOSIS — I5022 Chronic systolic (congestive) heart failure: Secondary | ICD-10-CM | POA: Diagnosis not present

## 2017-11-18 DIAGNOSIS — M9701XD Periprosthetic fracture around internal prosthetic right hip joint, subsequent encounter: Secondary | ICD-10-CM | POA: Diagnosis not present

## 2017-11-29 DIAGNOSIS — I5022 Chronic systolic (congestive) heart failure: Secondary | ICD-10-CM | POA: Diagnosis not present

## 2017-12-02 DIAGNOSIS — I129 Hypertensive chronic kidney disease with stage 1 through stage 4 chronic kidney disease, or unspecified chronic kidney disease: Secondary | ICD-10-CM | POA: Diagnosis not present

## 2017-12-02 DIAGNOSIS — N184 Chronic kidney disease, stage 4 (severe): Secondary | ICD-10-CM | POA: Diagnosis not present

## 2017-12-02 DIAGNOSIS — E1121 Type 2 diabetes mellitus with diabetic nephropathy: Secondary | ICD-10-CM | POA: Diagnosis not present

## 2017-12-02 DIAGNOSIS — N2581 Secondary hyperparathyroidism of renal origin: Secondary | ICD-10-CM | POA: Diagnosis not present

## 2017-12-02 DIAGNOSIS — D631 Anemia in chronic kidney disease: Secondary | ICD-10-CM | POA: Diagnosis not present

## 2017-12-04 DIAGNOSIS — E039 Hypothyroidism, unspecified: Secondary | ICD-10-CM | POA: Diagnosis present

## 2017-12-04 DIAGNOSIS — N184 Chronic kidney disease, stage 4 (severe): Secondary | ICD-10-CM | POA: Diagnosis present

## 2017-12-04 DIAGNOSIS — I5023 Acute on chronic systolic (congestive) heart failure: Secondary | ICD-10-CM | POA: Diagnosis not present

## 2017-12-04 DIAGNOSIS — Z66 Do not resuscitate: Secondary | ICD-10-CM | POA: Diagnosis present

## 2017-12-04 DIAGNOSIS — R32 Unspecified urinary incontinence: Secondary | ICD-10-CM | POA: Diagnosis present

## 2017-12-04 DIAGNOSIS — Z95810 Presence of automatic (implantable) cardiac defibrillator: Secondary | ICD-10-CM | POA: Diagnosis not present

## 2017-12-04 DIAGNOSIS — I509 Heart failure, unspecified: Secondary | ICD-10-CM | POA: Diagnosis not present

## 2017-12-04 DIAGNOSIS — J984 Other disorders of lung: Secondary | ICD-10-CM | POA: Diagnosis not present

## 2017-12-04 DIAGNOSIS — Z951 Presence of aortocoronary bypass graft: Secondary | ICD-10-CM | POA: Diagnosis not present

## 2017-12-04 DIAGNOSIS — N189 Chronic kidney disease, unspecified: Secondary | ICD-10-CM | POA: Diagnosis not present

## 2017-12-04 DIAGNOSIS — I2510 Atherosclerotic heart disease of native coronary artery without angina pectoris: Secondary | ICD-10-CM | POA: Diagnosis present

## 2017-12-04 DIAGNOSIS — R0602 Shortness of breath: Secondary | ICD-10-CM | POA: Diagnosis not present

## 2017-12-04 DIAGNOSIS — N179 Acute kidney failure, unspecified: Secondary | ICD-10-CM | POA: Diagnosis not present

## 2017-12-04 DIAGNOSIS — I4892 Unspecified atrial flutter: Secondary | ICD-10-CM | POA: Diagnosis present

## 2017-12-04 DIAGNOSIS — I5030 Unspecified diastolic (congestive) heart failure: Secondary | ICD-10-CM | POA: Diagnosis not present

## 2017-12-04 DIAGNOSIS — I517 Cardiomegaly: Secondary | ICD-10-CM | POA: Diagnosis not present

## 2017-12-04 DIAGNOSIS — Z7901 Long term (current) use of anticoagulants: Secondary | ICD-10-CM | POA: Diagnosis not present

## 2017-12-04 DIAGNOSIS — R338 Other retention of urine: Secondary | ICD-10-CM | POA: Diagnosis present

## 2017-12-04 DIAGNOSIS — I130 Hypertensive heart and chronic kidney disease with heart failure and stage 1 through stage 4 chronic kidney disease, or unspecified chronic kidney disease: Secondary | ICD-10-CM | POA: Diagnosis not present

## 2017-12-04 DIAGNOSIS — N19 Unspecified kidney failure: Secondary | ICD-10-CM | POA: Diagnosis not present

## 2017-12-04 DIAGNOSIS — Z87891 Personal history of nicotine dependence: Secondary | ICD-10-CM | POA: Diagnosis not present

## 2017-12-04 DIAGNOSIS — N401 Enlarged prostate with lower urinary tract symptoms: Secondary | ICD-10-CM | POA: Diagnosis present

## 2017-12-04 DIAGNOSIS — F0390 Unspecified dementia without behavioral disturbance: Secondary | ICD-10-CM | POA: Diagnosis present

## 2017-12-04 DIAGNOSIS — I252 Old myocardial infarction: Secondary | ICD-10-CM | POA: Diagnosis not present

## 2017-12-04 DIAGNOSIS — I255 Ischemic cardiomyopathy: Secondary | ICD-10-CM | POA: Diagnosis present

## 2017-12-04 DIAGNOSIS — I480 Paroxysmal atrial fibrillation: Secondary | ICD-10-CM | POA: Diagnosis present

## 2017-12-04 DIAGNOSIS — Z743 Need for continuous supervision: Secondary | ICD-10-CM | POA: Diagnosis not present

## 2017-12-04 DIAGNOSIS — I440 Atrioventricular block, first degree: Secondary | ICD-10-CM | POA: Diagnosis not present

## 2017-12-04 DIAGNOSIS — E8770 Fluid overload, unspecified: Secondary | ICD-10-CM | POA: Diagnosis not present

## 2017-12-04 DIAGNOSIS — E1122 Type 2 diabetes mellitus with diabetic chronic kidney disease: Secondary | ICD-10-CM | POA: Diagnosis not present

## 2017-12-04 DIAGNOSIS — J449 Chronic obstructive pulmonary disease, unspecified: Secondary | ICD-10-CM | POA: Diagnosis present

## 2017-12-04 DIAGNOSIS — E119 Type 2 diabetes mellitus without complications: Secondary | ICD-10-CM | POA: Diagnosis present

## 2017-12-04 DIAGNOSIS — R0600 Dyspnea, unspecified: Secondary | ICD-10-CM | POA: Diagnosis not present

## 2017-12-04 DIAGNOSIS — N183 Chronic kidney disease, stage 3 (moderate): Secondary | ICD-10-CM | POA: Diagnosis not present

## 2017-12-13 DIAGNOSIS — E039 Hypothyroidism, unspecified: Secondary | ICD-10-CM | POA: Diagnosis present

## 2017-12-13 DIAGNOSIS — N184 Chronic kidney disease, stage 4 (severe): Secondary | ICD-10-CM | POA: Diagnosis present

## 2017-12-13 DIAGNOSIS — Z66 Do not resuscitate: Secondary | ICD-10-CM | POA: Diagnosis present

## 2017-12-13 DIAGNOSIS — Z96643 Presence of artificial hip joint, bilateral: Secondary | ICD-10-CM | POA: Diagnosis present

## 2017-12-13 DIAGNOSIS — S79911A Unspecified injury of right hip, initial encounter: Secondary | ICD-10-CM | POA: Diagnosis not present

## 2017-12-13 DIAGNOSIS — I255 Ischemic cardiomyopathy: Secondary | ICD-10-CM | POA: Diagnosis present

## 2017-12-13 DIAGNOSIS — S14109A Unspecified injury at unspecified level of cervical spinal cord, initial encounter: Secondary | ICD-10-CM | POA: Diagnosis not present

## 2017-12-13 DIAGNOSIS — I440 Atrioventricular block, first degree: Secondary | ICD-10-CM | POA: Diagnosis not present

## 2017-12-13 DIAGNOSIS — Y92009 Unspecified place in unspecified non-institutional (private) residence as the place of occurrence of the external cause: Secondary | ICD-10-CM | POA: Diagnosis not present

## 2017-12-13 DIAGNOSIS — I252 Old myocardial infarction: Secondary | ICD-10-CM | POA: Diagnosis not present

## 2017-12-13 DIAGNOSIS — S066X0A Traumatic subarachnoid hemorrhage without loss of consciousness, initial encounter: Secondary | ICD-10-CM | POA: Diagnosis not present

## 2017-12-13 DIAGNOSIS — I130 Hypertensive heart and chronic kidney disease with heart failure and stage 1 through stage 4 chronic kidney disease, or unspecified chronic kidney disease: Secondary | ICD-10-CM | POA: Diagnosis present

## 2017-12-13 DIAGNOSIS — I4891 Unspecified atrial fibrillation: Secondary | ICD-10-CM | POA: Diagnosis present

## 2017-12-13 DIAGNOSIS — M25562 Pain in left knee: Secondary | ICD-10-CM | POA: Diagnosis not present

## 2017-12-13 DIAGNOSIS — S8992XA Unspecified injury of left lower leg, initial encounter: Secondary | ICD-10-CM | POA: Diagnosis not present

## 2017-12-13 DIAGNOSIS — R471 Dysarthria and anarthria: Secondary | ICD-10-CM | POA: Diagnosis present

## 2017-12-13 DIAGNOSIS — E861 Hypovolemia: Secondary | ICD-10-CM | POA: Diagnosis not present

## 2017-12-13 DIAGNOSIS — Z7901 Long term (current) use of anticoagulants: Secondary | ICD-10-CM | POA: Diagnosis not present

## 2017-12-13 DIAGNOSIS — E1122 Type 2 diabetes mellitus with diabetic chronic kidney disease: Secondary | ICD-10-CM | POA: Diagnosis present

## 2017-12-13 DIAGNOSIS — I420 Dilated cardiomyopathy: Secondary | ICD-10-CM | POA: Diagnosis not present

## 2017-12-13 DIAGNOSIS — S065X9A Traumatic subdural hemorrhage with loss of consciousness of unspecified duration, initial encounter: Secondary | ICD-10-CM | POA: Diagnosis not present

## 2017-12-13 DIAGNOSIS — J111 Influenza due to unidentified influenza virus with other respiratory manifestations: Secondary | ICD-10-CM | POA: Diagnosis not present

## 2017-12-13 DIAGNOSIS — E11649 Type 2 diabetes mellitus with hypoglycemia without coma: Secondary | ICD-10-CM | POA: Diagnosis present

## 2017-12-13 DIAGNOSIS — F331 Major depressive disorder, recurrent, moderate: Secondary | ICD-10-CM | POA: Diagnosis not present

## 2017-12-13 DIAGNOSIS — I441 Atrioventricular block, second degree: Secondary | ICD-10-CM | POA: Diagnosis not present

## 2017-12-13 DIAGNOSIS — S065X0A Traumatic subdural hemorrhage without loss of consciousness, initial encounter: Secondary | ICD-10-CM | POA: Diagnosis not present

## 2017-12-13 DIAGNOSIS — I509 Heart failure, unspecified: Secondary | ICD-10-CM | POA: Diagnosis not present

## 2017-12-13 DIAGNOSIS — S299XXA Unspecified injury of thorax, initial encounter: Secondary | ICD-10-CM | POA: Diagnosis not present

## 2017-12-13 DIAGNOSIS — R51 Headache: Secondary | ICD-10-CM | POA: Diagnosis not present

## 2017-12-13 DIAGNOSIS — D62 Acute posthemorrhagic anemia: Secondary | ICD-10-CM | POA: Diagnosis not present

## 2017-12-13 DIAGNOSIS — R110 Nausea: Secondary | ICD-10-CM | POA: Diagnosis not present

## 2017-12-13 DIAGNOSIS — I2510 Atherosclerotic heart disease of native coronary artery without angina pectoris: Secondary | ICD-10-CM | POA: Diagnosis present

## 2017-12-13 DIAGNOSIS — J9811 Atelectasis: Secondary | ICD-10-CM | POA: Diagnosis not present

## 2017-12-13 DIAGNOSIS — I6203 Nontraumatic chronic subdural hemorrhage: Secondary | ICD-10-CM | POA: Diagnosis not present

## 2017-12-13 DIAGNOSIS — I6200 Nontraumatic subdural hemorrhage, unspecified: Secondary | ICD-10-CM | POA: Diagnosis not present

## 2017-12-13 DIAGNOSIS — Z982 Presence of cerebrospinal fluid drainage device: Secondary | ICD-10-CM | POA: Diagnosis not present

## 2017-12-13 DIAGNOSIS — R0781 Pleurodynia: Secondary | ICD-10-CM | POA: Diagnosis not present

## 2017-12-13 DIAGNOSIS — G935 Compression of brain: Secondary | ICD-10-CM | POA: Diagnosis not present

## 2017-12-13 DIAGNOSIS — I608 Other nontraumatic subarachnoid hemorrhage: Secondary | ICD-10-CM | POA: Diagnosis not present

## 2017-12-13 DIAGNOSIS — I517 Cardiomegaly: Secondary | ICD-10-CM | POA: Diagnosis not present

## 2017-12-13 DIAGNOSIS — W19XXXA Unspecified fall, initial encounter: Secondary | ICD-10-CM | POA: Diagnosis not present

## 2017-12-13 DIAGNOSIS — Z951 Presence of aortocoronary bypass graft: Secondary | ICD-10-CM | POA: Diagnosis not present

## 2017-12-13 DIAGNOSIS — Z452 Encounter for adjustment and management of vascular access device: Secondary | ICD-10-CM | POA: Diagnosis not present

## 2017-12-13 DIAGNOSIS — I619 Nontraumatic intracerebral hemorrhage, unspecified: Secondary | ICD-10-CM | POA: Diagnosis not present

## 2017-12-13 DIAGNOSIS — R55 Syncope and collapse: Secondary | ICD-10-CM | POA: Diagnosis not present

## 2017-12-13 DIAGNOSIS — G9389 Other specified disorders of brain: Secondary | ICD-10-CM | POA: Diagnosis not present

## 2017-12-13 DIAGNOSIS — I340 Nonrheumatic mitral (valve) insufficiency: Secondary | ICD-10-CM | POA: Diagnosis present

## 2017-12-13 DIAGNOSIS — F0390 Unspecified dementia without behavioral disturbance: Secondary | ICD-10-CM | POA: Diagnosis not present

## 2017-12-13 DIAGNOSIS — Z9889 Other specified postprocedural states: Secondary | ICD-10-CM | POA: Diagnosis not present

## 2017-12-13 DIAGNOSIS — I4892 Unspecified atrial flutter: Secondary | ICD-10-CM | POA: Diagnosis not present

## 2017-12-13 DIAGNOSIS — Z95810 Presence of automatic (implantable) cardiac defibrillator: Secondary | ICD-10-CM | POA: Diagnosis not present

## 2017-12-13 DIAGNOSIS — J984 Other disorders of lung: Secondary | ICD-10-CM | POA: Diagnosis not present

## 2017-12-13 DIAGNOSIS — S066X9A Traumatic subarachnoid hemorrhage with loss of consciousness of unspecified duration, initial encounter: Secondary | ICD-10-CM | POA: Diagnosis not present

## 2017-12-13 DIAGNOSIS — Y9301 Activity, walking, marching and hiking: Secondary | ICD-10-CM | POA: Diagnosis not present

## 2017-12-24 DIAGNOSIS — I4891 Unspecified atrial fibrillation: Secondary | ICD-10-CM | POA: Diagnosis not present

## 2017-12-24 DIAGNOSIS — R4701 Aphasia: Secondary | ICD-10-CM | POA: Diagnosis not present

## 2017-12-24 DIAGNOSIS — I2510 Atherosclerotic heart disease of native coronary artery without angina pectoris: Secondary | ICD-10-CM | POA: Diagnosis not present

## 2017-12-24 DIAGNOSIS — H409 Unspecified glaucoma: Secondary | ICD-10-CM | POA: Diagnosis present

## 2017-12-24 DIAGNOSIS — R471 Dysarthria and anarthria: Secondary | ICD-10-CM | POA: Diagnosis present

## 2017-12-24 DIAGNOSIS — R1310 Dysphagia, unspecified: Secondary | ICD-10-CM | POA: Diagnosis not present

## 2017-12-24 DIAGNOSIS — R197 Diarrhea, unspecified: Secondary | ICD-10-CM | POA: Diagnosis not present

## 2017-12-24 DIAGNOSIS — B961 Klebsiella pneumoniae [K. pneumoniae] as the cause of diseases classified elsewhere: Secondary | ICD-10-CM | POA: Diagnosis not present

## 2017-12-24 DIAGNOSIS — Z9181 History of falling: Secondary | ICD-10-CM | POA: Diagnosis not present

## 2017-12-24 DIAGNOSIS — N186 End stage renal disease: Secondary | ICD-10-CM | POA: Diagnosis not present

## 2017-12-24 DIAGNOSIS — R4189 Other symptoms and signs involving cognitive functions and awareness: Secondary | ICD-10-CM | POA: Diagnosis present

## 2017-12-24 DIAGNOSIS — I5020 Unspecified systolic (congestive) heart failure: Secondary | ICD-10-CM | POA: Diagnosis not present

## 2017-12-24 DIAGNOSIS — E1122 Type 2 diabetes mellitus with diabetic chronic kidney disease: Secondary | ICD-10-CM | POA: Diagnosis not present

## 2017-12-24 DIAGNOSIS — E039 Hypothyroidism, unspecified: Secondary | ICD-10-CM | POA: Diagnosis not present

## 2017-12-24 DIAGNOSIS — I509 Heart failure, unspecified: Secondary | ICD-10-CM | POA: Diagnosis not present

## 2017-12-24 DIAGNOSIS — N179 Acute kidney failure, unspecified: Secondary | ICD-10-CM | POA: Diagnosis not present

## 2017-12-24 DIAGNOSIS — I129 Hypertensive chronic kidney disease with stage 1 through stage 4 chronic kidney disease, or unspecified chronic kidney disease: Secondary | ICD-10-CM | POA: Diagnosis not present

## 2017-12-24 DIAGNOSIS — S0990XA Unspecified injury of head, initial encounter: Secondary | ICD-10-CM | POA: Diagnosis not present

## 2017-12-24 DIAGNOSIS — W19XXXD Unspecified fall, subsequent encounter: Secondary | ICD-10-CM | POA: Diagnosis not present

## 2017-12-24 DIAGNOSIS — I6200 Nontraumatic subdural hemorrhage, unspecified: Secondary | ICD-10-CM | POA: Diagnosis not present

## 2017-12-24 DIAGNOSIS — Z87891 Personal history of nicotine dependence: Secondary | ICD-10-CM | POA: Diagnosis not present

## 2017-12-24 DIAGNOSIS — N390 Urinary tract infection, site not specified: Secondary | ICD-10-CM | POA: Diagnosis not present

## 2017-12-24 DIAGNOSIS — R410 Disorientation, unspecified: Secondary | ICD-10-CM | POA: Diagnosis not present

## 2017-12-24 DIAGNOSIS — I130 Hypertensive heart and chronic kidney disease with heart failure and stage 1 through stage 4 chronic kidney disease, or unspecified chronic kidney disease: Secondary | ICD-10-CM | POA: Diagnosis not present

## 2017-12-24 DIAGNOSIS — G92 Toxic encephalopathy: Secondary | ICD-10-CM | POA: Diagnosis not present

## 2017-12-24 DIAGNOSIS — F0390 Unspecified dementia without behavioral disturbance: Secondary | ICD-10-CM | POA: Diagnosis present

## 2017-12-24 DIAGNOSIS — E119 Type 2 diabetes mellitus without complications: Secondary | ICD-10-CM | POA: Diagnosis not present

## 2017-12-24 DIAGNOSIS — R41840 Attention and concentration deficit: Secondary | ICD-10-CM | POA: Diagnosis present

## 2017-12-24 DIAGNOSIS — I10 Essential (primary) hypertension: Secondary | ICD-10-CM | POA: Diagnosis not present

## 2017-12-24 DIAGNOSIS — S065X0D Traumatic subdural hemorrhage without loss of consciousness, subsequent encounter: Secondary | ICD-10-CM | POA: Diagnosis not present

## 2017-12-24 DIAGNOSIS — I5022 Chronic systolic (congestive) heart failure: Secondary | ICD-10-CM | POA: Diagnosis not present

## 2017-12-24 DIAGNOSIS — N183 Chronic kidney disease, stage 3 (moderate): Secondary | ICD-10-CM | POA: Diagnosis not present

## 2017-12-24 DIAGNOSIS — S066X0D Traumatic subarachnoid hemorrhage without loss of consciousness, subsequent encounter: Secondary | ICD-10-CM | POA: Diagnosis not present

## 2017-12-24 DIAGNOSIS — N189 Chronic kidney disease, unspecified: Secondary | ICD-10-CM | POA: Diagnosis not present

## 2017-12-24 DIAGNOSIS — I480 Paroxysmal atrial fibrillation: Secondary | ICD-10-CM | POA: Diagnosis not present

## 2017-12-24 DIAGNOSIS — G40909 Epilepsy, unspecified, not intractable, without status epilepticus: Secondary | ICD-10-CM | POA: Diagnosis not present

## 2017-12-24 DIAGNOSIS — R1312 Dysphagia, oropharyngeal phase: Secondary | ICD-10-CM | POA: Diagnosis not present

## 2017-12-24 DIAGNOSIS — N19 Unspecified kidney failure: Secondary | ICD-10-CM | POA: Diagnosis not present

## 2017-12-24 DIAGNOSIS — G629 Polyneuropathy, unspecified: Secondary | ICD-10-CM | POA: Diagnosis present

## 2017-12-24 DIAGNOSIS — N184 Chronic kidney disease, stage 4 (severe): Secondary | ICD-10-CM | POA: Diagnosis not present

## 2017-12-24 DIAGNOSIS — F329 Major depressive disorder, single episode, unspecified: Secondary | ICD-10-CM | POA: Diagnosis present

## 2017-12-24 DIAGNOSIS — R339 Retention of urine, unspecified: Secondary | ICD-10-CM | POA: Diagnosis present

## 2017-12-24 DIAGNOSIS — E875 Hyperkalemia: Secondary | ICD-10-CM | POA: Diagnosis not present

## 2017-12-24 DIAGNOSIS — G40219 Localization-related (focal) (partial) symptomatic epilepsy and epileptic syndromes with complex partial seizures, intractable, without status epilepticus: Secondary | ICD-10-CM | POA: Diagnosis not present

## 2017-12-24 DIAGNOSIS — I619 Nontraumatic intracerebral hemorrhage, unspecified: Secondary | ICD-10-CM | POA: Diagnosis not present

## 2017-12-24 DIAGNOSIS — I252 Old myocardial infarction: Secondary | ICD-10-CM | POA: Diagnosis not present

## 2017-12-24 DIAGNOSIS — E872 Acidosis: Secondary | ICD-10-CM | POA: Diagnosis not present

## 2017-12-24 DIAGNOSIS — R4182 Altered mental status, unspecified: Secondary | ICD-10-CM | POA: Diagnosis not present

## 2017-12-24 DIAGNOSIS — G9340 Encephalopathy, unspecified: Secondary | ICD-10-CM | POA: Diagnosis not present

## 2017-12-28 DIAGNOSIS — G9340 Encephalopathy, unspecified: Secondary | ICD-10-CM | POA: Diagnosis not present

## 2017-12-28 DIAGNOSIS — G40219 Localization-related (focal) (partial) symptomatic epilepsy and epileptic syndromes with complex partial seizures, intractable, without status epilepticus: Secondary | ICD-10-CM | POA: Diagnosis not present

## 2018-01-10 DIAGNOSIS — Z95810 Presence of automatic (implantable) cardiac defibrillator: Secondary | ICD-10-CM | POA: Diagnosis not present

## 2018-01-10 DIAGNOSIS — I6200 Nontraumatic subdural hemorrhage, unspecified: Secondary | ICD-10-CM | POA: Diagnosis not present

## 2018-01-10 DIAGNOSIS — N183 Chronic kidney disease, stage 3 (moderate): Secondary | ICD-10-CM | POA: Diagnosis not present

## 2018-01-10 DIAGNOSIS — R4182 Altered mental status, unspecified: Secondary | ICD-10-CM | POA: Diagnosis not present

## 2018-01-10 DIAGNOSIS — E039 Hypothyroidism, unspecified: Secondary | ICD-10-CM | POA: Diagnosis present

## 2018-01-10 DIAGNOSIS — E1122 Type 2 diabetes mellitus with diabetic chronic kidney disease: Secondary | ICD-10-CM | POA: Diagnosis not present

## 2018-01-10 DIAGNOSIS — N186 End stage renal disease: Secondary | ICD-10-CM | POA: Diagnosis not present

## 2018-01-10 DIAGNOSIS — F0390 Unspecified dementia without behavioral disturbance: Secondary | ICD-10-CM | POA: Diagnosis present

## 2018-01-10 DIAGNOSIS — N185 Chronic kidney disease, stage 5: Secondary | ICD-10-CM | POA: Diagnosis not present

## 2018-01-10 DIAGNOSIS — N189 Chronic kidney disease, unspecified: Secondary | ICD-10-CM | POA: Diagnosis not present

## 2018-01-10 DIAGNOSIS — D631 Anemia in chronic kidney disease: Secondary | ICD-10-CM | POA: Diagnosis present

## 2018-01-10 DIAGNOSIS — I252 Old myocardial infarction: Secondary | ICD-10-CM | POA: Diagnosis not present

## 2018-01-10 DIAGNOSIS — I129 Hypertensive chronic kidney disease with stage 1 through stage 4 chronic kidney disease, or unspecified chronic kidney disease: Secondary | ICD-10-CM | POA: Diagnosis not present

## 2018-01-10 DIAGNOSIS — G253 Myoclonus: Secondary | ICD-10-CM | POA: Diagnosis not present

## 2018-01-10 DIAGNOSIS — Z951 Presence of aortocoronary bypass graft: Secondary | ICD-10-CM | POA: Diagnosis not present

## 2018-01-10 DIAGNOSIS — N2581 Secondary hyperparathyroidism of renal origin: Secondary | ICD-10-CM | POA: Diagnosis present

## 2018-01-10 DIAGNOSIS — I480 Paroxysmal atrial fibrillation: Secondary | ICD-10-CM | POA: Diagnosis present

## 2018-01-10 DIAGNOSIS — I5022 Chronic systolic (congestive) heart failure: Secondary | ICD-10-CM | POA: Diagnosis not present

## 2018-01-10 DIAGNOSIS — I4891 Unspecified atrial fibrillation: Secondary | ICD-10-CM | POA: Diagnosis not present

## 2018-01-10 DIAGNOSIS — I2510 Atherosclerotic heart disease of native coronary artery without angina pectoris: Secondary | ICD-10-CM | POA: Diagnosis present

## 2018-01-10 DIAGNOSIS — N184 Chronic kidney disease, stage 4 (severe): Secondary | ICD-10-CM | POA: Diagnosis present

## 2018-01-10 DIAGNOSIS — E875 Hyperkalemia: Secondary | ICD-10-CM | POA: Diagnosis not present

## 2018-01-10 DIAGNOSIS — G92 Toxic encephalopathy: Secondary | ICD-10-CM | POA: Diagnosis not present

## 2018-01-10 DIAGNOSIS — R410 Disorientation, unspecified: Secondary | ICD-10-CM | POA: Diagnosis not present

## 2018-01-10 DIAGNOSIS — E872 Acidosis: Secondary | ICD-10-CM | POA: Diagnosis not present

## 2018-01-10 DIAGNOSIS — I130 Hypertensive heart and chronic kidney disease with heart failure and stage 1 through stage 4 chronic kidney disease, or unspecified chronic kidney disease: Secondary | ICD-10-CM | POA: Diagnosis not present

## 2018-01-10 DIAGNOSIS — D696 Thrombocytopenia, unspecified: Secondary | ICD-10-CM | POA: Diagnosis present

## 2018-01-10 DIAGNOSIS — G9340 Encephalopathy, unspecified: Secondary | ICD-10-CM | POA: Diagnosis not present

## 2018-01-10 DIAGNOSIS — N19 Unspecified kidney failure: Secondary | ICD-10-CM | POA: Diagnosis not present

## 2018-01-10 DIAGNOSIS — N179 Acute kidney failure, unspecified: Secondary | ICD-10-CM | POA: Diagnosis not present

## 2018-01-10 DIAGNOSIS — Z66 Do not resuscitate: Secondary | ICD-10-CM | POA: Diagnosis present

## 2018-01-10 DIAGNOSIS — N390 Urinary tract infection, site not specified: Secondary | ICD-10-CM | POA: Diagnosis not present

## 2018-01-10 DIAGNOSIS — Z87891 Personal history of nicotine dependence: Secondary | ICD-10-CM | POA: Diagnosis not present

## 2019-01-02 ENCOUNTER — Other Ambulatory Visit: Payer: Self-pay | Admitting: *Deleted

## 2019-01-03 ENCOUNTER — Other Ambulatory Visit: Payer: Self-pay | Admitting: *Deleted

## 2019-01-05 ENCOUNTER — Other Ambulatory Visit: Payer: Self-pay | Admitting: *Deleted
